# Patient Record
Sex: Female | Born: 1970
Health system: Southern US, Community
[De-identification: ages and names within clinical notes are randomized; demographics above are authoritative.]

## PROBLEM LIST (undated history)

## (undated) DIAGNOSIS — J45909 Unspecified asthma, uncomplicated: Secondary | ICD-10-CM

## (undated) DIAGNOSIS — I219 Acute myocardial infarction, unspecified: Secondary | ICD-10-CM

## (undated) DIAGNOSIS — F319 Bipolar disorder, unspecified: Secondary | ICD-10-CM

## (undated) DIAGNOSIS — K219 Gastro-esophageal reflux disease without esophagitis: Secondary | ICD-10-CM

## (undated) DIAGNOSIS — I10 Essential (primary) hypertension: Secondary | ICD-10-CM

## (undated) DIAGNOSIS — S52023A Displaced fracture of olecranon process without intraarticular extension of unspecified ulna, initial encounter for closed fracture: Secondary | ICD-10-CM

## (undated) HISTORY — PX: ABDOMINAL HYSTERECTOMY: SHX81

## (undated) HISTORY — PX: CHOLECYSTECTOMY: SHX55

---

## 1999-05-04 ENCOUNTER — Inpatient Hospital Stay (HOSPITAL_COMMUNITY): Admission: AD | Admit: 1999-05-04 | Discharge: 1999-05-04 | Payer: Self-pay | Admitting: Obstetrics

## 1999-05-04 ENCOUNTER — Encounter: Payer: Self-pay | Admitting: Obstetrics

## 2000-06-10 ENCOUNTER — Inpatient Hospital Stay (HOSPITAL_COMMUNITY): Admission: AD | Admit: 2000-06-10 | Discharge: 2000-06-10 | Payer: Self-pay | Admitting: *Deleted

## 2001-03-06 ENCOUNTER — Other Ambulatory Visit: Admission: RE | Admit: 2001-03-06 | Discharge: 2001-03-06 | Payer: Self-pay | Admitting: Family Medicine

## 2005-06-12 ENCOUNTER — Emergency Department (HOSPITAL_COMMUNITY): Admission: EM | Admit: 2005-06-12 | Discharge: 2005-06-12 | Payer: Self-pay | Admitting: Emergency Medicine

## 2005-07-30 ENCOUNTER — Emergency Department (HOSPITAL_COMMUNITY): Admission: EM | Admit: 2005-07-30 | Discharge: 2005-07-30 | Payer: Self-pay | Admitting: Emergency Medicine

## 2005-08-13 ENCOUNTER — Emergency Department (HOSPITAL_COMMUNITY): Admission: EM | Admit: 2005-08-13 | Discharge: 2005-08-13 | Payer: Self-pay | Admitting: Emergency Medicine

## 2007-08-20 ENCOUNTER — Inpatient Hospital Stay (HOSPITAL_COMMUNITY): Admission: AD | Admit: 2007-08-20 | Discharge: 2007-08-23 | Payer: Self-pay | Admitting: Psychiatry

## 2007-08-20 ENCOUNTER — Ambulatory Visit: Payer: Self-pay | Admitting: Psychiatry
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
## 2016-01-06 ENCOUNTER — Emergency Department (HOSPITAL_COMMUNITY): Payer: Medicaid Other

## 2016-01-06 ENCOUNTER — Encounter (HOSPITAL_COMMUNITY): Payer: Self-pay | Admitting: *Deleted

## 2016-01-06 ENCOUNTER — Inpatient Hospital Stay (HOSPITAL_COMMUNITY)
Admission: EM | Admit: 2016-01-06 | Discharge: 2016-02-05 | DRG: 004 | Disposition: A | Discharge status: Rehabilitation Facility | Payer: Medicaid Other | Attending: General Surgery | Admitting: General Surgery

## 2016-01-06 ENCOUNTER — Inpatient Hospital Stay (HOSPITAL_COMMUNITY): Payer: Medicaid Other

## 2016-01-06 DIAGNOSIS — J9690 Respiratory failure, unspecified, unspecified whether with hypoxia or hypercapnia: Secondary | ICD-10-CM

## 2016-01-06 DIAGNOSIS — S22019A Unspecified fracture of first thoracic vertebra, initial encounter for closed fracture: Secondary | ICD-10-CM | POA: Diagnosis present

## 2016-01-06 DIAGNOSIS — S062X9A Diffuse traumatic brain injury with loss of consciousness of unspecified duration, initial encounter: Principal | ICD-10-CM | POA: Diagnosis present

## 2016-01-06 DIAGNOSIS — S2242XA Multiple fractures of ribs, left side, initial encounter for closed fracture: Secondary | ICD-10-CM | POA: Diagnosis present

## 2016-01-06 DIAGNOSIS — Z91040 Latex allergy status: Secondary | ICD-10-CM

## 2016-01-06 DIAGNOSIS — I10 Essential (primary) hypertension: Secondary | ICD-10-CM | POA: Diagnosis present

## 2016-01-06 DIAGNOSIS — S065XAA Traumatic subdural hemorrhage with loss of consciousness status unknown, initial encounter: Secondary | ICD-10-CM | POA: Insufficient documentation

## 2016-01-06 DIAGNOSIS — S270XXA Traumatic pneumothorax, initial encounter: Secondary | ICD-10-CM | POA: Diagnosis present

## 2016-01-06 DIAGNOSIS — J9601 Acute respiratory failure with hypoxia: Secondary | ICD-10-CM | POA: Diagnosis present

## 2016-01-06 DIAGNOSIS — G9340 Encephalopathy, unspecified: Secondary | ICD-10-CM | POA: Diagnosis present

## 2016-01-06 DIAGNOSIS — S32401A Unspecified fracture of right acetabulum, initial encounter for closed fracture: Secondary | ICD-10-CM | POA: Diagnosis present

## 2016-01-06 DIAGNOSIS — J939 Pneumothorax, unspecified: Secondary | ICD-10-CM | POA: Insufficient documentation

## 2016-01-06 DIAGNOSIS — D62 Acute posthemorrhagic anemia: Secondary | ICD-10-CM | POA: Diagnosis present

## 2016-01-06 DIAGNOSIS — J80 Acute respiratory distress syndrome: Secondary | ICD-10-CM | POA: Diagnosis not present

## 2016-01-06 DIAGNOSIS — S069XAA Unspecified intracranial injury with loss of consciousness status unknown, initial encounter: Secondary | ICD-10-CM

## 2016-01-06 DIAGNOSIS — R0602 Shortness of breath: Secondary | ICD-10-CM | POA: Insufficient documentation

## 2016-01-06 DIAGNOSIS — J9811 Atelectasis: Secondary | ICD-10-CM | POA: Diagnosis present

## 2016-01-06 DIAGNOSIS — E872 Acidosis: Secondary | ICD-10-CM | POA: Diagnosis present

## 2016-01-06 DIAGNOSIS — S3210XA Unspecified fracture of sacrum, initial encounter for closed fracture: Secondary | ICD-10-CM | POA: Diagnosis present

## 2016-01-06 DIAGNOSIS — S065X9A Traumatic subdural hemorrhage with loss of consciousness of unspecified duration, initial encounter: Secondary | ICD-10-CM | POA: Insufficient documentation

## 2016-01-06 DIAGNOSIS — F10239 Alcohol dependence with withdrawal, unspecified: Secondary | ICD-10-CM | POA: Diagnosis not present

## 2016-01-06 DIAGNOSIS — T48902S Poisoning by unspecified agents primarily acting on the respiratory system, intentional self-harm, sequela: Secondary | ICD-10-CM

## 2016-01-06 DIAGNOSIS — B373 Candidiasis of vulva and vagina: Secondary | ICD-10-CM | POA: Diagnosis not present

## 2016-01-06 DIAGNOSIS — J9600 Acute respiratory failure, unspecified whether with hypoxia or hypercapnia: Secondary | ICD-10-CM | POA: Insufficient documentation

## 2016-01-06 DIAGNOSIS — R739 Hyperglycemia, unspecified: Secondary | ICD-10-CM | POA: Diagnosis not present

## 2016-01-06 DIAGNOSIS — S3219XA Other fracture of sacrum, initial encounter for closed fracture: Secondary | ICD-10-CM | POA: Diagnosis present

## 2016-01-06 DIAGNOSIS — S299XXA Unspecified injury of thorax, initial encounter: Secondary | ICD-10-CM

## 2016-01-06 DIAGNOSIS — S22009A Unspecified fracture of unspecified thoracic vertebra, initial encounter for closed fracture: Secondary | ICD-10-CM | POA: Diagnosis present

## 2016-01-06 DIAGNOSIS — A0472 Enterocolitis due to Clostridium difficile, not specified as recurrent: Secondary | ICD-10-CM | POA: Diagnosis not present

## 2016-01-06 DIAGNOSIS — Z9689 Presence of other specified functional implants: Secondary | ICD-10-CM

## 2016-01-06 DIAGNOSIS — Y906 Blood alcohol level of 120-199 mg/100 ml: Secondary | ICD-10-CM | POA: Diagnosis present

## 2016-01-06 DIAGNOSIS — S0101XA Laceration without foreign body of scalp, initial encounter: Secondary | ICD-10-CM | POA: Diagnosis present

## 2016-01-06 DIAGNOSIS — Z978 Presence of other specified devices: Secondary | ICD-10-CM

## 2016-01-06 DIAGNOSIS — J156 Pneumonia due to other aerobic Gram-negative bacteria: Secondary | ICD-10-CM | POA: Diagnosis not present

## 2016-01-06 DIAGNOSIS — S32599A Other specified fracture of unspecified pubis, initial encounter for closed fracture: Secondary | ICD-10-CM | POA: Diagnosis present

## 2016-01-06 DIAGNOSIS — Z4659 Encounter for fitting and adjustment of other gastrointestinal appliance and device: Secondary | ICD-10-CM

## 2016-01-06 DIAGNOSIS — S06369A Traumatic hemorrhage of cerebrum, unspecified, with loss of consciousness of unspecified duration, initial encounter: Secondary | ICD-10-CM | POA: Diagnosis present

## 2016-01-06 DIAGNOSIS — S069X9A Unspecified intracranial injury with loss of consciousness of unspecified duration, initial encounter: Secondary | ICD-10-CM

## 2016-01-06 DIAGNOSIS — S2249XA Multiple fractures of ribs, unspecified side, initial encounter for closed fracture: Secondary | ICD-10-CM

## 2016-01-06 DIAGNOSIS — T07XXXA Unspecified multiple injuries, initial encounter: Secondary | ICD-10-CM

## 2016-01-06 DIAGNOSIS — R000 Tachycardia, unspecified: Secondary | ICD-10-CM | POA: Insufficient documentation

## 2016-01-06 DIAGNOSIS — Z781 Physical restraint status: Secondary | ICD-10-CM

## 2016-01-06 DIAGNOSIS — A047 Enterocolitis due to Clostridium difficile: Secondary | ICD-10-CM | POA: Diagnosis not present

## 2016-01-06 DIAGNOSIS — S12600A Unspecified displaced fracture of seventh cervical vertebra, initial encounter for closed fracture: Secondary | ICD-10-CM | POA: Diagnosis present

## 2016-01-06 DIAGNOSIS — G2581 Restless legs syndrome: Secondary | ICD-10-CM | POA: Diagnosis not present

## 2016-01-06 DIAGNOSIS — S061X3A Traumatic cerebral edema with loss of consciousness of 1 hour to 5 hours 59 minutes, initial encounter: Secondary | ICD-10-CM | POA: Diagnosis present

## 2016-01-06 DIAGNOSIS — D6489 Other specified anemias: Secondary | ICD-10-CM | POA: Diagnosis present

## 2016-01-06 DIAGNOSIS — S32059A Unspecified fracture of fifth lumbar vertebra, initial encounter for closed fracture: Secondary | ICD-10-CM | POA: Diagnosis present

## 2016-01-06 DIAGNOSIS — S32591A Other specified fracture of right pubis, initial encounter for closed fracture: Secondary | ICD-10-CM | POA: Diagnosis present

## 2016-01-06 DIAGNOSIS — F4323 Adjustment disorder with mixed anxiety and depressed mood: Secondary | ICD-10-CM | POA: Insufficient documentation

## 2016-01-06 DIAGNOSIS — R1310 Dysphagia, unspecified: Secondary | ICD-10-CM | POA: Insufficient documentation

## 2016-01-06 DIAGNOSIS — Z4682 Encounter for fitting and adjustment of non-vascular catheter: Secondary | ICD-10-CM

## 2016-01-06 DIAGNOSIS — Z880 Allergy status to penicillin: Secondary | ICD-10-CM

## 2016-01-06 DIAGNOSIS — Y95 Nosocomial condition: Secondary | ICD-10-CM | POA: Diagnosis not present

## 2016-01-06 DIAGNOSIS — S27329A Contusion of lung, unspecified, initial encounter: Secondary | ICD-10-CM | POA: Diagnosis present

## 2016-01-06 DIAGNOSIS — R4182 Altered mental status, unspecified: Secondary | ICD-10-CM | POA: Diagnosis present

## 2016-01-06 DIAGNOSIS — S32409A Unspecified fracture of unspecified acetabulum, initial encounter for closed fracture: Secondary | ICD-10-CM | POA: Diagnosis present

## 2016-01-06 DIAGNOSIS — R40242 Glasgow coma scale score 9-12, unspecified time: Secondary | ICD-10-CM | POA: Diagnosis present

## 2016-01-06 DIAGNOSIS — R451 Restlessness and agitation: Secondary | ICD-10-CM | POA: Diagnosis present

## 2016-01-06 DIAGNOSIS — Z79899 Other long term (current) drug therapy: Secondary | ICD-10-CM

## 2016-01-06 DIAGNOSIS — H109 Unspecified conjunctivitis: Secondary | ICD-10-CM | POA: Diagnosis not present

## 2016-01-06 DIAGNOSIS — B9562 Methicillin resistant Staphylococcus aureus infection as the cause of diseases classified elsewhere: Secondary | ICD-10-CM | POA: Diagnosis not present

## 2016-01-06 DIAGNOSIS — F1410 Cocaine abuse, uncomplicated: Secondary | ICD-10-CM | POA: Insufficient documentation

## 2016-01-06 DIAGNOSIS — F1010 Alcohol abuse, uncomplicated: Secondary | ICD-10-CM | POA: Insufficient documentation

## 2016-01-06 DIAGNOSIS — J189 Pneumonia, unspecified organism: Secondary | ICD-10-CM | POA: Diagnosis not present

## 2016-01-06 DIAGNOSIS — S134XXA Sprain of ligaments of cervical spine, initial encounter: Secondary | ICD-10-CM | POA: Diagnosis present

## 2016-01-06 DIAGNOSIS — Y92488 Other paved roadways as the place of occurrence of the external cause: Secondary | ICD-10-CM | POA: Diagnosis not present

## 2016-01-06 DIAGNOSIS — E8770 Fluid overload, unspecified: Secondary | ICD-10-CM | POA: Diagnosis present

## 2016-01-06 DIAGNOSIS — S129XXA Fracture of neck, unspecified, initial encounter: Secondary | ICD-10-CM | POA: Diagnosis present

## 2016-01-06 DIAGNOSIS — Z930 Tracheostomy status: Secondary | ICD-10-CM

## 2016-01-06 DIAGNOSIS — T148XXA Other injury of unspecified body region, initial encounter: Secondary | ICD-10-CM

## 2016-01-06 LAB — BLOOD PRODUCT ORDER (VERBAL) VERIFICATION

## 2016-01-06 LAB — COMPREHENSIVE METABOLIC PANEL
ALT: 93 U/L — ABNORMAL HIGH (ref 14–54)
ANION GAP: 16 — AB (ref 5–15)
AST: 180 U/L — ABNORMAL HIGH (ref 15–41)
Albumin: 3.9 g/dL (ref 3.5–5.0)
Alkaline Phosphatase: 67 U/L (ref 38–126)
BILIRUBIN TOTAL: 0.3 mg/dL (ref 0.3–1.2)
BUN: <5 mg/dL — ABNORMAL LOW (ref 6–20)
CALCIUM: 8.8 mg/dL — AB (ref 8.9–10.3)
CO2: 23 mmol/L (ref 22–32)
Chloride: 101 mmol/L (ref 101–111)
Creatinine, Ser: 0.67 mg/dL (ref 0.44–1.00)
Glucose, Bld: 136 mg/dL — ABNORMAL HIGH (ref 65–99)
POTASSIUM: 3.7 mmol/L (ref 3.5–5.1)
Sodium: 140 mmol/L (ref 135–145)
TOTAL PROTEIN: 6.6 g/dL (ref 6.5–8.1)

## 2016-01-06 LAB — URINALYSIS, ROUTINE W REFLEX MICROSCOPIC
BILIRUBIN URINE: NEGATIVE
Glucose, UA: NEGATIVE mg/dL
Ketones, ur: NEGATIVE mg/dL
Leukocytes, UA: NEGATIVE
NITRITE: NEGATIVE
PROTEIN: 30 mg/dL — AB
SPECIFIC GRAVITY, URINE: 1.026 (ref 1.005–1.030)
pH: 5.5 (ref 5.0–8.0)

## 2016-01-06 LAB — URINE MICROSCOPIC-ADD ON
Bacteria, UA: NONE SEEN
WBC, UA: NONE SEEN WBC/hpf (ref 0–5)

## 2016-01-06 LAB — CBG MONITORING, ED: GLUCOSE-CAPILLARY: 146 mg/dL — AB (ref 65–99)

## 2016-01-06 LAB — BLOOD GAS, ARTERIAL
Acid-base deficit: 0.9 mmol/L (ref 0.0–2.0)
Bicarbonate: 24.1 meq/L — ABNORMAL HIGH (ref 20.0–24.0)
Drawn by: 441371
FIO2: 0.5
MECHVT: 480 mL
O2 Saturation: 97.5 %
PEEP: 5 cmH2O
Patient temperature: 98.6
RATE: 20 {breaths}/min
TCO2: 25.5 mmol/L (ref 0–100)
pCO2 arterial: 45.7 mmHg — ABNORMAL HIGH (ref 35.0–45.0)
pH, Arterial: 7.342 — ABNORMAL LOW (ref 7.350–7.450)
pO2, Arterial: 106 mmHg — ABNORMAL HIGH (ref 80.0–100.0)

## 2016-01-06 LAB — I-STAT CHEM 8, ED
BUN: 5 mg/dL — AB (ref 6–20)
CALCIUM ION: 1.05 mmol/L — AB (ref 1.12–1.23)
CREATININE: 0.9 mg/dL (ref 0.44–1.00)
Chloride: 103 mmol/L (ref 101–111)
GLUCOSE: 134 mg/dL — AB (ref 65–99)
HCT: 43 % (ref 36.0–46.0)
Hemoglobin: 14.6 g/dL (ref 12.0–15.0)
Potassium: 3.9 mmol/L (ref 3.5–5.1)
SODIUM: 141 mmol/L (ref 135–145)
TCO2: 24 mmol/L (ref 0–100)

## 2016-01-06 LAB — I-STAT CG4 LACTIC ACID, ED: LACTIC ACID, VENOUS: 2.53 mmol/L — AB (ref 0.5–2.0)

## 2016-01-06 LAB — TRIGLYCERIDES: TRIGLYCERIDES: 304 mg/dL — AB (ref <150)

## 2016-01-06 LAB — CBC
HEMATOCRIT: 39.7 % (ref 36.0–46.0)
HEMOGLOBIN: 12.9 g/dL (ref 12.0–15.0)
MCH: 31.8 pg (ref 26.0–34.0)
MCHC: 32.5 g/dL (ref 30.0–36.0)
MCV: 97.8 fL (ref 78.0–100.0)
Platelets: 301 10*3/uL (ref 150–400)
RBC: 4.06 MIL/uL (ref 3.87–5.11)
RDW: 13.2 % (ref 11.5–15.5)
WBC: 16.3 10*3/uL — AB (ref 4.0–10.5)

## 2016-01-06 LAB — ETHANOL: ALCOHOL ETHYL (B): 171 mg/dL — AB (ref <5)

## 2016-01-06 LAB — I-STAT BETA HCG BLOOD, ED (MC, WL, AP ONLY)

## 2016-01-06 LAB — MRSA PCR SCREENING: MRSA by PCR: NEGATIVE

## 2016-01-06 LAB — PROTIME-INR
INR: 1.11 (ref 0.00–1.49)
Prothrombin Time: 14.5 seconds (ref 11.6–15.2)

## 2016-01-06 LAB — CDS SEROLOGY

## 2016-01-06 MED ORDER — PROPOFOL 1000 MG/100ML IV EMUL
0.0000 ug/kg/min | INTRAVENOUS | Status: DC
  Administered 2016-01-06 (×2): 50 ug/kg/min via INTRAVENOUS
  Administered 2016-01-06: 40 ug/kg/min via INTRAVENOUS
  Administered 2016-01-06 – 2016-01-07 (×3): 50 ug/kg/min via INTRAVENOUS
  Administered 2016-01-07: 30 ug/kg/min via INTRAVENOUS
  Administered 2016-01-07: 50 ug/kg/min via INTRAVENOUS
  Administered 2016-01-07: 30 ug/kg/min via INTRAVENOUS
  Administered 2016-01-08: 15 ug/kg/min via INTRAVENOUS
  Administered 2016-01-08 – 2016-01-09 (×6): 30 ug/kg/min via INTRAVENOUS
  Administered 2016-01-10 (×2): 50 ug/kg/min via INTRAVENOUS
  Filled 2016-01-06 (×19): qty 100

## 2016-01-06 MED ORDER — ACETAMINOPHEN 325 MG PO TABS
650.0000 mg | ORAL_TABLET | ORAL | Status: DC | PRN
  Administered 2016-01-07: 650 mg via ORAL
  Filled 2016-01-06 (×2): qty 2

## 2016-01-06 MED ORDER — SODIUM CHLORIDE 0.9 % IV SOLN
1.0000 ug/h | Freq: Once | INTRAVENOUS | Status: AC
  Administered 2016-01-06: 50 ug/h via INTRAVENOUS
  Filled 2016-01-06: qty 50

## 2016-01-06 MED ORDER — FENTANYL CITRATE (PF) 100 MCG/2ML IJ SOLN: 100.0000 ug | INTRAMUSCULAR | Status: DC | PRN

## 2016-01-06 MED ORDER — FENTANYL CITRATE (PF) 100 MCG/2ML IJ SOLN
100.0000 ug | Freq: Once | INTRAMUSCULAR | Status: DC
Start: 2016-01-06 — End: 2016-01-06
  Filled 2016-01-06: qty 2

## 2016-01-06 MED ORDER — ONDANSETRON HCL 4 MG/2ML IJ SOLN
4.0000 mg | Freq: Four times a day (QID) | INTRAMUSCULAR | Status: DC | PRN
  Administered 2016-01-31 – 2016-02-03 (×3): 4 mg via INTRAVENOUS
  Filled 2016-01-06 (×3): qty 2

## 2016-01-06 MED ORDER — FENTANYL CITRATE (PF) 2500 MCG/50ML IJ SOLN
25.0000 ug/h | INTRAMUSCULAR | Status: DC
  Administered 2016-01-06: 200 ug/h via INTRAVENOUS
  Administered 2016-01-06 – 2016-01-07 (×2): 100 ug/h via INTRAVENOUS
  Administered 2016-01-08: 75 ug/h via INTRAVENOUS
  Administered 2016-01-09: 100 ug/h via INTRAVENOUS
  Administered 2016-01-10 – 2016-01-11 (×2): 400 ug/h via INTRAVENOUS
  Filled 2016-01-06 (×8): qty 50

## 2016-01-06 MED ORDER — IOPAMIDOL (ISOVUE-300) INJECTION 61%
INTRAVENOUS | Status: AC
  Filled 2016-01-06: qty 100

## 2016-01-06 MED ORDER — SODIUM CHLORIDE 0.9 % IV SOLN
INTRAVENOUS | Status: DC
  Administered 2016-01-06: 100 mL/h via INTRAVENOUS

## 2016-01-06 MED ORDER — SODIUM CHLORIDE 0.9 % IV SOLN
INTRAVENOUS | Status: DC
  Administered 2016-01-08 – 2016-01-09 (×2): via INTRAVENOUS
  Administered 2016-01-10: 125 mL/h via INTRAVENOUS
  Administered 2016-01-11 (×2): 125 mL via INTRAVENOUS
  Administered 2016-01-16 – 2016-01-20 (×2): via INTRAVENOUS
  Administered 2016-01-22: 250 mL via INTRAVENOUS

## 2016-01-06 MED ORDER — PROPOFOL 1000 MG/100ML IV EMUL
INTRAVENOUS | Status: AC
  Filled 2016-01-06: qty 100

## 2016-01-06 MED ORDER — TETANUS-DIPHTH-ACELL PERTUSSIS 5-2.5-18.5 LF-MCG/0.5 IM SUSP
0.5000 mL | Freq: Once | INTRAMUSCULAR | Status: AC
  Administered 2016-01-06: 0.5 mL via INTRAMUSCULAR

## 2016-01-06 MED ORDER — FENTANYL CITRATE (PF) 100 MCG/2ML IJ SOLN
INTRAMUSCULAR | Status: AC | PRN
  Administered 2016-01-06: 100 ug via INTRAVENOUS

## 2016-01-06 MED ORDER — ANTISEPTIC ORAL RINSE SOLUTION (CORINZ)
7.0000 mL | Freq: Four times a day (QID) | OROMUCOSAL | Status: DC
  Administered 2016-01-06 – 2016-01-08 (×9): 7 mL via OROMUCOSAL

## 2016-01-06 MED ORDER — CETYLPYRIDINIUM CHLORIDE 0.05 % MT LIQD: 7.0000 mL | Freq: Two times a day (BID) | OROMUCOSAL | Status: DC

## 2016-01-06 MED ORDER — PROPOFOL 1000 MG/100ML IV EMUL
5.0000 ug/kg/min | Freq: Once | INTRAVENOUS | Status: AC
  Administered 2016-01-06: 10 ug/kg/min via INTRAVENOUS

## 2016-01-06 MED ORDER — CHLORHEXIDINE GLUCONATE 0.12% ORAL RINSE (MEDLINE KIT)
15.0000 mL | Freq: Two times a day (BID) | OROMUCOSAL | Status: DC
  Administered 2016-01-06 – 2016-01-08 (×4): 15 mL via OROMUCOSAL

## 2016-01-06 MED ORDER — MORPHINE SULFATE (PF) 2 MG/ML IV SOLN: 2.0000 mg | INTRAVENOUS | Status: DC | PRN

## 2016-01-06 MED ORDER — SODIUM CHLORIDE 0.9 % IV BOLUS (SEPSIS)
1000.0000 mL | Freq: Once | INTRAVENOUS | Status: AC
  Administered 2016-01-06: 1000 mL via INTRAVENOUS

## 2016-01-06 MED ORDER — LORAZEPAM 2 MG/ML IJ SOLN
INTRAMUSCULAR | Status: AC | PRN
  Administered 2016-01-06: 2 mg via INTRAVENOUS

## 2016-01-06 MED ORDER — TETANUS-DIPHTH-ACELL PERTUSSIS 5-2.5-18.5 LF-MCG/0.5 IM SUSP
INTRAMUSCULAR | Status: AC
  Filled 2016-01-06: qty 0.5

## 2016-01-06 MED ORDER — ONDANSETRON HCL 4 MG PO TABS: 4.0000 mg | ORAL_TABLET | Freq: Four times a day (QID) | ORAL | Status: DC | PRN

## 2016-01-06 MED ORDER — CHLORHEXIDINE GLUCONATE 0.12 % MT SOLN
15.0000 mL | Freq: Two times a day (BID) | OROMUCOSAL | Status: DC
  Administered 2016-01-07: 15 mL via OROMUCOSAL

## 2016-01-06 MED ORDER — DOCUSATE SODIUM 100 MG PO CAPS
100.0000 mg | ORAL_CAPSULE | Freq: Two times a day (BID) | ORAL | Status: DC
  Administered 2016-01-10 – 2016-01-11 (×2): 100 mg via ORAL
  Filled 2016-01-06 (×2): qty 1

## 2016-01-06 MED ORDER — HALOPERIDOL LACTATE 5 MG/ML IJ SOLN
INTRAMUSCULAR | Status: AC
  Filled 2016-01-06: qty 1

## 2016-01-06 MED ORDER — FENTANYL BOLUS VIA INFUSION
50.0000 ug | INTRAVENOUS | Status: DC | PRN
  Filled 2016-01-06: qty 100

## 2016-01-06 MED ORDER — ETOMIDATE 2 MG/ML IV SOLN
INTRAVENOUS | Status: AC | PRN
  Administered 2016-01-06: 30 mg via INTRAVENOUS

## 2016-01-06 MED ORDER — SUCCINYLCHOLINE CHLORIDE 20 MG/ML IJ SOLN
INTRAMUSCULAR | Status: AC | PRN
  Administered 2016-01-06: 120 mg via INTRAVENOUS
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
## 2016-01-06 NOTE — ED Notes (Signed)
Trauma surgeon Dr Donnetta HailKinsenger at bedside.
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
## 2016-01-06 NOTE — ED Notes (Signed)
Neuro surgery at bedside.
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
## 2016-01-06 NOTE — Progress Notes (Signed)
[facility] by [facility] physician with a 7.5 ETT taped at 22 cm at lip, good color change on ETCO2 detector, good BBS placed on above vent settings MD aware.

## 2016-01-06 NOTE — ED Notes (Signed)
Attempted report 
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
## 2016-01-06 NOTE — ED Notes (Signed)
Blood bank called and stated blood type does not match what was previously in pt chart.  RE-ordered.  Spoke with registration and photo of pt does not match pt.  [employer] just arrived and stated pt we have been charting on is not pt in the stretcher.
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
## 2016-01-06 NOTE — ED Notes (Signed)
Spoke with Dr Alvan DameKensinger staff who stated to start fentanyl drip.
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
## 2016-01-07 ENCOUNTER — Inpatient Hospital Stay (HOSPITAL_COMMUNITY): Payer: Medicaid Other

## 2016-01-07 LAB — TYPE AND SCREEN
ABO/RH(D): A POS
ABO/RH(D): A POS
Antibody Screen: NEGATIVE
Antibody Screen: NEGATIVE
UNIT DIVISION: 0
Unit division: 0

## 2016-01-07 LAB — BASIC METABOLIC PANEL
Anion gap: 9 (ref 5–15)
Anion gap: 6 (ref 5–15)
BUN: 9 mg/dL (ref 6–20)
BUN: 11 mg/dL (ref 6–20)
CHLORIDE: 107 mmol/L (ref 101–111)
CHLORIDE: 108 mmol/L (ref 101–111)
CO2: 24 mmol/L (ref 22–32)
CO2: 25 mmol/L (ref 22–32)
CREATININE: 0.77 mg/dL (ref 0.44–1.00)
Calcium: 7.8 mg/dL — ABNORMAL LOW (ref 8.9–10.3)
Calcium: 7.2 mg/dL — ABNORMAL LOW (ref 8.9–10.3)
Creatinine, Ser: 0.76 mg/dL (ref 0.44–1.00)
GFR calc Af Amer: >60 mL/min (ref >60)
GFR calc Af Amer: >60 mL/min (ref >60)
GFR calc non Af Amer: >60 mL/min (ref >60)
GFR calc non Af Amer: >60 mL/min (ref >60)
GLUCOSE: 74 mg/dL (ref 65–99)
GLUCOSE: 85 mg/dL (ref 65–99)
POTASSIUM: 3.6 mmol/L (ref 3.5–5.1)
POTASSIUM: 3.2 mmol/L — AB (ref 3.5–5.1)
Sodium: 140 mmol/L (ref 135–145)
Sodium: 139 mmol/L (ref 135–145)

## 2016-01-07 LAB — CBC
HCT: 38.6 % (ref 36.0–46.0)
HEMOGLOBIN: 12.2 g/dL (ref 12.0–15.0)
MCH: 31.5 pg (ref 26.0–34.0)
MCHC: 31.6 g/dL (ref 30.0–36.0)
MCV: 99.7 fL (ref 78.0–100.0)
PLATELETS: 177 10*3/uL (ref 150–400)
RBC: 3.87 MIL/uL (ref 3.87–5.11)
RDW: 13.8 % (ref 11.5–15.5)
WBC: 10.3 10*3/uL (ref 4.0–10.5)

## 2016-01-07 LAB — GLUCOSE, CAPILLARY
GLUCOSE-CAPILLARY: 77 mg/dL (ref 65–99)
GLUCOSE-CAPILLARY: 95 mg/dL (ref 65–99)
GLUCOSE-CAPILLARY: 77 mg/dL (ref 65–99)
Glucose-Capillary: 76 mg/dL (ref 65–99)
Glucose-Capillary: 107 mg/dL — ABNORMAL HIGH (ref 65–99)
Glucose-Capillary: 82 mg/dL (ref 65–99)

## 2016-01-07 LAB — SODIUM, URINE, RANDOM: SODIUM UR: 36 mmol/L

## 2016-01-07 LAB — CK TOTAL AND CKMB (NOT AT ARMC)
CK TOTAL: 770 U/L — AB (ref 38–234)
CK, MB: 8.1 ng/mL — ABNORMAL HIGH (ref 0.5–5.0)
Relative Index: 1.1 (ref 0.0–2.5)

## 2016-01-07 LAB — CREATININE, URINE, RANDOM: CREATININE, URINE: 268.47 mg/dL

## 2016-01-07 MED ORDER — LACTATED RINGERS IV BOLUS (SEPSIS)
1000.0000 mL | Freq: Once | INTRAVENOUS | Status: AC
  Administered 2016-01-07: 1000 mL via INTRAVENOUS

## 2016-01-07 MED ORDER — SODIUM CHLORIDE 0.9 % IV BOLUS (SEPSIS)
500.0000 mL | Freq: Once | INTRAVENOUS | Status: AC
  Administered 2016-01-07: 500 mL via INTRAVENOUS

## 2016-01-07 MED ORDER — ALBUMIN HUMAN 5 % IV SOLN
25.0000 g | Freq: Once | INTRAVENOUS | Status: AC
  Administered 2016-01-07: 25 g via INTRAVENOUS
  Filled 2016-01-07: qty 500

## 2016-01-07 MED ORDER — PANTOPRAZOLE SODIUM 40 MG IV SOLR
40.0000 mg | Freq: Every day | INTRAVENOUS | Status: DC
  Administered 2016-01-07 – 2016-01-16 (×10): 40 mg via INTRAVENOUS
  Filled 2016-01-07 (×10): qty 40

## 2016-01-07 NOTE — Progress Notes (Signed)
[facility] results: CK total-770, CK,MB-8.1, K-3.2, urine sent, receiving albumin now, urine approx 20/hr. Order for 1L LR bolus, renewed restraints. VS stable. Will cont to monitor and update as needed. Tericka Devincenzi L

## 2016-01-07 NOTE — Progress Notes (Signed)
Paged Dr. Donell BeersByerly about decreasing urine output.  Scant urine over the past two hours.  Will continue to assess and try to contact the physician.  Tommi EmeryHINTZ, Kodah Maret M
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
## 2016-01-07 NOTE — Progress Notes (Signed)
Patient ID: [name], female   DOB: 09/26/1970, 45 y.o.   MRN: [id number] Stable, f/c. Intubated. Around with family.
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
## 2016-01-08 ENCOUNTER — Inpatient Hospital Stay (HOSPITAL_COMMUNITY): Payer: Medicaid Other

## 2016-01-08 LAB — RAPID URINE DRUG SCREEN, HOSP PERFORMED
Amphetamines: NOT DETECTED
Barbiturates: NOT DETECTED
Benzodiazepines: POSITIVE — AB
Cocaine: POSITIVE — AB
OPIATES: NOT DETECTED
Tetrahydrocannabinol: POSITIVE — AB

## 2016-01-08 LAB — GLUCOSE, CAPILLARY
GLUCOSE-CAPILLARY: 75 mg/dL (ref 65–99)
GLUCOSE-CAPILLARY: 81 mg/dL (ref 65–99)
Glucose-Capillary: 73 mg/dL (ref 65–99)
Glucose-Capillary: 79 mg/dL (ref 65–99)
Glucose-Capillary: 84 mg/dL (ref 65–99)
Glucose-Capillary: 88 mg/dL (ref 65–99)
Glucose-Capillary: 91 mg/dL (ref 65–99)

## 2016-01-08 LAB — PREPARE FRESH FROZEN PLASMA
Unit division: 0
Unit division: 0

## 2016-01-08 LAB — CBC WITH DIFFERENTIAL/PLATELET
Basophils Absolute: 0 10*3/uL (ref 0.0–0.1)
Basophils Relative: 0 %
EOS ABS: 0.2 10*3/uL (ref 0.0–0.7)
EOS PCT: 2 %
HCT: 29.6 % — ABNORMAL LOW (ref 36.0–46.0)
Hemoglobin: 9.3 g/dL — ABNORMAL LOW (ref 12.0–15.0)
LYMPHS ABS: 1.9 10*3/uL (ref 0.7–4.0)
LYMPHS PCT: 19 %
MCH: 31.4 pg (ref 26.0–34.0)
MCHC: 31.4 g/dL (ref 30.0–36.0)
MCV: 100 fL (ref 78.0–100.0)
MONO ABS: 0.7 10*3/uL (ref 0.1–1.0)
Monocytes Relative: 7 %
Neutro Abs: 7.1 10*3/uL (ref 1.7–7.7)
Neutrophils Relative %: 72 %
PLATELETS: 133 10*3/uL — AB (ref 150–400)
RBC: 2.96 MIL/uL — ABNORMAL LOW (ref 3.87–5.11)
RDW: 13.5 % (ref 11.5–15.5)
WBC: 10 10*3/uL (ref 4.0–10.5)

## 2016-01-08 LAB — BASIC METABOLIC PANEL
Anion gap: 8 (ref 5–15)
BUN: 9 mg/dL (ref 6–20)
CALCIUM: 7.3 mg/dL — AB (ref 8.9–10.3)
CO2: 22 mmol/L (ref 22–32)
CREATININE: 0.67 mg/dL (ref 0.44–1.00)
Chloride: 109 mmol/L (ref 101–111)
GFR calc Af Amer: >60 mL/min (ref >60)
GLUCOSE: 77 mg/dL (ref 65–99)
Potassium: 3.6 mmol/L (ref 3.5–5.1)
SODIUM: 139 mmol/L (ref 135–145)

## 2016-01-08 LAB — POCT I-STAT 3, ART BLOOD GAS (G3+)
ACID-BASE DEFICIT: 3 mmol/L — AB (ref 0.0–2.0)
BICARBONATE: 22.4 meq/L (ref 20.0–24.0)
O2 SAT: 96 %
PO2 ART: 75 mmHg — AB (ref 80.0–100.0)
Patient temperature: 35.4
TCO2: 24 mmol/L (ref 0–100)
pCO2 arterial: 36 mmHg (ref 35.0–45.0)
pH, Arterial: 7.395 (ref 7.350–7.450)

## 2016-01-08 MED ORDER — ANTISEPTIC ORAL RINSE SOLUTION (CORINZ)
7.0000 mL | OROMUCOSAL | Status: DC
  Administered 2016-01-08 – 2016-01-29 (×207): 7 mL via OROMUCOSAL

## 2016-01-08 MED ORDER — IPRATROPIUM-ALBUTEROL 0.5-2.5 (3) MG/3ML IN SOLN
3.0000 mL | Freq: Four times a day (QID) | RESPIRATORY_TRACT | Status: DC
  Administered 2016-01-08 – 2016-01-09 (×4): 3 mL via RESPIRATORY_TRACT
  Filled 2016-01-08 (×4): qty 3

## 2016-01-08 MED ORDER — ANTISEPTIC ORAL RINSE SOLUTION (CORINZ): 7.0000 mL | Freq: Four times a day (QID) | OROMUCOSAL | Status: DC

## 2016-01-08 MED ORDER — CHLORHEXIDINE GLUCONATE 0.12% ORAL RINSE (MEDLINE KIT): 15.0000 mL | Freq: Two times a day (BID) | OROMUCOSAL | Status: DC

## 2016-01-08 MED ORDER — CHLORHEXIDINE GLUCONATE 0.12% ORAL RINSE (MEDLINE KIT)
15.0000 mL | Freq: Two times a day (BID) | OROMUCOSAL | Status: DC
  Administered 2016-01-08 – 2016-01-29 (×43): 15 mL via OROMUCOSAL

## 2016-01-08 MED ORDER — IOPAMIDOL (ISOVUE-300) INJECTION 61%
100.0000 mL | Freq: Once | INTRAVENOUS | Status: AC | PRN
  Administered 2016-01-06: 100 mL via INTRAVENOUS

## 2016-01-08 MED ORDER — ACETAMINOPHEN 160 MG/5ML PO SOLN
650.0000 mg | ORAL | Status: DC | PRN
  Administered 2016-01-08 – 2016-01-28 (×29): 650 mg
  Filled 2016-01-08 (×28): qty 20.3
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
## 2016-01-08 NOTE — Progress Notes (Signed)
Patient ID: [name], female   DOB: 08/11/1971, 45 y.o.   MRN: [id number] Intubated, moves all 4 extremities for ct head this am

## 2016-01-08 NOTE — Progress Notes (Signed)
Transported pt to and from 2S15 to CT 3 on ventilator. Pt stable throughout with no complications. RT will continue to monitor
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
## 2016-01-09 LAB — BASIC METABOLIC PANEL
ANION GAP: 9 (ref 5–15)
CHLORIDE: 109 mmol/L (ref 101–111)
CO2: 20 mmol/L — ABNORMAL LOW (ref 22–32)
Calcium: 7.3 mg/dL — ABNORMAL LOW (ref 8.9–10.3)
Creatinine, Ser: 0.62 mg/dL (ref 0.44–1.00)
GFR calc Af Amer: >60 mL/min (ref >60)
GFR calc non Af Amer: >60 mL/min (ref >60)
Glucose, Bld: 81 mg/dL (ref 65–99)
POTASSIUM: 3 mmol/L — AB (ref 3.5–5.1)
SODIUM: 138 mmol/L (ref 135–145)

## 2016-01-09 LAB — CBC WITH DIFFERENTIAL/PLATELET
Basophils Absolute: 0 10*3/uL (ref 0.0–0.1)
Basophils Relative: 0 %
EOS ABS: 0.2 10*3/uL (ref 0.0–0.7)
Eosinophils Relative: 3 %
HCT: 25.6 % — ABNORMAL LOW (ref 36.0–46.0)
Hemoglobin: 8.2 g/dL — ABNORMAL LOW (ref 12.0–15.0)
Lymphocytes Relative: 15 %
Lymphs Abs: 1.1 10*3/uL (ref 0.7–4.0)
MCH: 30.8 pg (ref 26.0–34.0)
MCHC: 32 g/dL (ref 30.0–36.0)
MCV: 96.2 fL (ref 78.0–100.0)
MONO ABS: 0.5 10*3/uL (ref 0.1–1.0)
Monocytes Relative: 7 %
NEUTROS PCT: 75 %
Neutro Abs: 5.7 10*3/uL (ref 1.7–7.7)
PLATELETS: 166 10*3/uL (ref 150–400)
RBC: 2.66 MIL/uL — AB (ref 3.87–5.11)
RDW: 13.3 % (ref 11.5–15.5)
SMEAR REVIEW: ADEQUATE
WBC: 7.5 10*3/uL (ref 4.0–10.5)

## 2016-01-09 LAB — GLUCOSE, CAPILLARY
GLUCOSE-CAPILLARY: 73 mg/dL (ref 65–99)
Glucose-Capillary: 76 mg/dL (ref 65–99)
Glucose-Capillary: 87 mg/dL (ref 65–99)
Glucose-Capillary: 98 mg/dL (ref 65–99)
Glucose-Capillary: 123 mg/dL — ABNORMAL HIGH (ref 65–99)
Glucose-Capillary: 145 mg/dL — ABNORMAL HIGH (ref 65–99)

## 2016-01-09 LAB — TRIGLYCERIDES
TRIGLYCERIDES: 257 mg/dL — AB (ref <150)
TRIGLYCERIDES: 264 mg/dL — AB (ref <150)

## 2016-01-09 MED ORDER — IPRATROPIUM-ALBUTEROL 0.5-2.5 (3) MG/3ML IN SOLN
3.0000 mL | Freq: Four times a day (QID) | RESPIRATORY_TRACT | Status: DC
  Administered 2016-01-09 – 2016-01-12 (×12): 3 mL via RESPIRATORY_TRACT
  Filled 2016-01-09: qty 3
  Filled 2016-01-09: qty 39
  Filled 2016-01-09 (×10): qty 3

## 2016-01-09 MED ORDER — VITAMIN C 500 MG PO TABS
1000.0000 mg | ORAL_TABLET | Freq: Three times a day (TID) | ORAL | Status: AC
  Administered 2016-01-09 – 2016-01-16 (×21): 1000 mg
  Filled 2016-01-09 (×21): qty 2

## 2016-01-09 MED ORDER — POTASSIUM CHLORIDE 10 MEQ/100ML IV SOLN
10.0000 meq | INTRAVENOUS | Status: AC
  Administered 2016-01-09 (×2): 10 meq via INTRAVENOUS
  Filled 2016-01-09 (×2): qty 100

## 2016-01-09 MED ORDER — POTASSIUM CHLORIDE 10 MEQ/50ML IV SOLN: 10.0000 meq | INTRAVENOUS | Status: DC

## 2016-01-09 MED ORDER — SELENIUM 50 MCG PO TABS
200.0000 ug | ORAL_TABLET | Freq: Every day | ORAL | Status: AC
Start: 2016-01-09 — End: 2016-01-15
  Administered 2016-01-09 – 2016-01-15 (×7): 200 ug
  Filled 2016-01-09 (×7): qty 4

## 2016-01-09 MED ORDER — PIVOT 1.5 CAL PO LIQD
1000.0000 mL | ORAL | Status: DC
  Administered 2016-01-10 – 2016-01-11 (×2): 1000 mL

## 2016-01-09 MED ORDER — POTASSIUM CHLORIDE 20 MEQ/15ML (10%) PO SOLN
40.0000 meq | Freq: Two times a day (BID) | ORAL | Status: AC
  Administered 2016-01-09 (×2): 40 meq via ORAL
  Filled 2016-01-09 (×2): qty 30

## 2016-01-09 MED ORDER — PRO-STAT SUGAR FREE PO LIQD
30.0000 mL | Freq: Three times a day (TID) | ORAL | Status: DC
  Administered 2016-01-09 – 2016-01-12 (×9): 30 mL
  Filled 2016-01-09 (×8): qty 30

## 2016-01-09 MED ORDER — PIVOT 1.5 CAL PO LIQD
1000.0000 mL | ORAL | Status: DC
  Administered 2016-01-09: 1000 mL
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
## 2016-01-09 NOTE — Progress Notes (Signed)
Patient ID: [name], female   DOB: 06/05/1971, 45 y.o.   MRN: [id number] Neuro stable intubated  New ct head shows new in blood. No changes in her treatment

## 2016-01-09 NOTE — Progress Notes (Signed)
Patient ID: [name], female   DOB: 12/28/1970, 45 y.o.   MRN: [id number] I called her daughter, [name], and updated he plan of care. I answered her questions. Violeta GelinasBurke Ringo Sherod, MD, MPH, FACS Trauma: [phone] General Surgery: [phone]
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
## 2016-01-10 ENCOUNTER — Inpatient Hospital Stay (HOSPITAL_COMMUNITY): Payer: Medicaid Other

## 2016-01-10 LAB — GLUCOSE, CAPILLARY
GLUCOSE-CAPILLARY: 118 mg/dL — AB (ref 65–99)
GLUCOSE-CAPILLARY: 158 mg/dL — AB (ref 65–99)
GLUCOSE-CAPILLARY: 128 mg/dL — AB (ref 65–99)
GLUCOSE-CAPILLARY: 151 mg/dL — AB (ref 65–99)
Glucose-Capillary: 138 mg/dL — ABNORMAL HIGH (ref 65–99)

## 2016-01-10 LAB — CBC
HEMATOCRIT: 28.4 % — AB (ref 36.0–46.0)
HEMOGLOBIN: 9 g/dL — AB (ref 12.0–15.0)
MCH: 31.1 pg (ref 26.0–34.0)
MCHC: 31.7 g/dL (ref 30.0–36.0)
MCV: 98.3 fL (ref 78.0–100.0)
Platelets: 213 10*3/uL (ref 150–400)
RBC: 2.89 MIL/uL — AB (ref 3.87–5.11)
RDW: 13.7 % (ref 11.5–15.5)
WBC: 7 10*3/uL (ref 4.0–10.5)

## 2016-01-10 LAB — BASIC METABOLIC PANEL
Anion gap: 5 (ref 5–15)
BUN: 6 mg/dL (ref 6–20)
CHLORIDE: 112 mmol/L — AB (ref 101–111)
CO2: 24 mmol/L (ref 22–32)
CREATININE: 0.52 mg/dL (ref 0.44–1.00)
Calcium: 7.9 mg/dL — ABNORMAL LOW (ref 8.9–10.3)
GFR calc non Af Amer: >60 mL/min (ref >60)
Glucose, Bld: 137 mg/dL — ABNORMAL HIGH (ref 65–99)
POTASSIUM: 3.2 mmol/L — AB (ref 3.5–5.1)
Sodium: 141 mmol/L (ref 135–145)

## 2016-01-10 LAB — URINE MICROSCOPIC-ADD ON

## 2016-01-10 LAB — URINALYSIS, ROUTINE W REFLEX MICROSCOPIC
Bilirubin Urine: NEGATIVE
GLUCOSE, UA: NEGATIVE mg/dL
HGB URINE DIPSTICK: NEGATIVE
Ketones, ur: NEGATIVE mg/dL
Nitrite: NEGATIVE
PH: 6 (ref 5.0–8.0)
PROTEIN: NEGATIVE mg/dL
Specific Gravity, Urine: 1.023 (ref 1.005–1.030)

## 2016-01-10 MED ORDER — DEXMEDETOMIDINE HCL IN NACL 400 MCG/100ML IV SOLN
0.4000 ug/kg/h | INTRAVENOUS | Status: DC
  Administered 2016-01-10 (×2): 1.2 ug/kg/h via INTRAVENOUS
  Administered 2016-01-10: 1 ug/kg/h via INTRAVENOUS
  Administered 2016-01-11: 1.2 ug/kg/h via INTRAVENOUS
  Administered 2016-01-11: 2 ug/kg/h via INTRAVENOUS
  Administered 2016-01-11: 1.2 ug/kg/h via INTRAVENOUS
  Administered 2016-01-11: 0.5 ug/kg/h via INTRAVENOUS
  Administered 2016-01-11: 1.2 ug/kg/h via INTRAVENOUS
  Administered 2016-01-11 – 2016-01-12 (×2): 1 ug/kg/h via INTRAVENOUS
  Filled 2016-01-10 (×6): qty 100
  Filled 2016-01-10: qty 50
  Filled 2016-01-10 (×4): qty 100
  Filled 2016-01-10: qty 50
  Filled 2016-01-10 (×3): qty 100

## 2016-01-10 MED ORDER — POTASSIUM CHLORIDE CRYS ER 20 MEQ PO TBCR: 40.0000 meq | EXTENDED_RELEASE_TABLET | Freq: Two times a day (BID) | ORAL | Status: DC

## 2016-01-10 MED ORDER — POTASSIUM CHLORIDE 20 MEQ/15ML (10%) PO SOLN
40.0000 meq | Freq: Two times a day (BID) | ORAL | Status: DC
  Administered 2016-01-10 – 2016-02-05 (×53): 40 meq
  Filled 2016-01-10 (×53): qty 30

## 2016-01-10 MED ORDER — POTASSIUM CHLORIDE 10 MEQ/100ML IV SOLN
10.0000 meq | INTRAVENOUS | Status: AC
  Administered 2016-01-10 (×2): 10 meq via INTRAVENOUS
  Filled 2016-01-10 (×2): qty 100

## 2016-01-10 MED ORDER — FUROSEMIDE 10 MG/ML IJ SOLN
40.0000 mg | Freq: Once | INTRAMUSCULAR | Status: AC
  Administered 2016-01-10: 40 mg via INTRAVENOUS
  Filled 2016-01-10: qty 4

## 2016-01-10 NOTE — Progress Notes (Signed)
Follow up - Trauma and Critical Care  Patient Details:    [name] is an 45 y.o. female.  Lines/tubes : Airway 7.5 mm (Active)  Secured at (cm) 22 cm 01/10/2016  5:04 AM  Measured From Lips 01/10/2016  5:04 AM  Secured Location Right 01/10/2016  5:04 AM  Secured By [employer] 01/10/2016  5:04 AM  Tube Holder Repositioned Yes 01/10/2016 12:03 AM  Cuff Pressure (cm H2O) 25 cm H2O 01/10/2016  5:04 AM  Site Condition Dry 01/10/2016  5:04 AM     NG/OG Tube Orogastric Right mouth (Active)  Placement Verification Auscultation 01/09/2016  8:00 PM  Site Assessment Clean;Dry;Intact 01/09/2016  8:00 PM  Status Suction-low intermittent;Irrigated 01/09/2016  8:00 PM  Drainage Appearance Bile 01/09/2016  8:00 PM  Intake (mL) 30 mL 01/10/2016 12:00 AM  Output (mL) 0 mL 01/09/2016  4:00 AM     Urethral Catheter [name], EMT Non-latex;Temperature probe 14 Fr. (Active)  Indication for Insertion or Continuance of Catheter Unstable critical patients (first 24-48 hours);Unstable spinal/crush injuries 01/10/2016  7:18 AM  Site Assessment Clean;Intact 01/09/2016  8:00 PM  Catheter Maintenance Bag below level of bladder;Catheter secured;Drainage bag/tubing not touching floor;Insertion date on drainage bag;No dependent loops;Seal intact 01/10/2016  7:18 AM  Collection Container Standard drainage bag 01/09/2016  8:00 PM  Securement Method Leg strap 01/09/2016  8:00 PM  Urinary Catheter Interventions Unclamped 01/09/2016  8:00 PM  Input (mL) 50 mL 01/08/2016 12:02 PM  Output (mL) 50 mL 01/10/2016  6:00 AM    Microbiology/Sepsis markers: Results for orders placed or performed during the hospital encounter of 01/06/16  MRSA PCR Screening     Status: None   Collection Time: 01/06/16  1:45 PM  Result Value Ref Range Status   MRSA by PCR NEGATIVE NEGATIVE Final    Comment:        The GeneXpert MRSA Assay (FDA approved for NASAL specimens only), is one component of a comprehensive MRSA  colonization surveillance program. It is not intended to diagnose MRSA infection nor to guide or monitor treatment for MRSA infections.     Anti-infectives:  Anti-infectives    None      Best Practice/Protocols:  VTE Prophylaxis: Mechanical GI Prophylaxis: Proton Pump Inhibitor Continous Sedation propofol and fentanyl  Consults: Treatment Team:  Trauma Md, MD Sheral Apley, MD Hilda Lias, MD    Events:  Subjective:    Overnight Issues: Patient gets very agitated and works against her getting extubated.  Objective:  Vital signs for last 24 hours: Temp:  [97.7 F (36.5 C)-100.4 F (38 C)] 99.7 F (37.6 C) (05/24 0700) Pulse Rate:  [72-116] 90 (05/24 0700) Resp:  [16-20] 20 (05/24 0700) BP: (98-139)/(47-85) 98/47 mmHg (05/24 0700) SpO2:  [92 %-100 %] 99 % (05/24 0700) FiO2 (%):  [40 %] 40 % (05/24 0504) Weight:  [83.1 kg (183 lb 3.2 oz)] 83.1 kg (183 lb 3.2 oz) (05/24 0200)  Hemodynamic parameters for last 24 hours:    Intake/Output from previous day: 05/23 0701 - 05/24 0700 In: 4917.2 [I.V.:3874.5; NG/GT:942.7; IV Piggyback:100] Out: 1595 [Urine:1595]  Intake/Output this shift:    Vent settings for last 24 hours: Vent Mode:  [-] PRVC FiO2 (%):  [40 %] 40 % Set Rate:  [20 bmp] 20 bmp Vt Set:  [480 mL] 480 mL PEEP:  [5 cmH20] 5 cmH20 Plateau Pressure:  [21 cmH20-24 cmH20] 21 cmH20  Physical Exam:  General: no respiratory distress Neuro: nonfocal exam and RASS -1 Resp:  clear to auscultation bilaterally CVS: regular rate and rhythm, S1, S2 normal, no murmur, click, rub or gallop GI: soft, nontender, BS WNL, no r/g and Tolerating tube feedings well Extremities: no edema, no erythema, pulses WNL  Results for orders placed or performed during the hospital encounter of 01/06/16 (from the past 24 hour(s))  Triglycerides     Status: Abnormal   Collection Time: 01/09/16  9:08 AM  Result Value Ref Range   Triglycerides 264 (H) <150 mg/dL   Glucose, capillary     Status: None   Collection Time: 01/09/16 11:38 AM  Result Value Ref Range   Glucose-Capillary 87 65 - 99 mg/dL   Comment 1 Notify RN   Glucose, capillary     Status: None   Collection Time: 01/09/16  3:30 PM  Result Value Ref Range   Glucose-Capillary 98 65 - 99 mg/dL   Comment 1 Notify RN   Glucose, capillary     Status: Abnormal   Collection Time: 01/09/16  7:17 PM  Result Value Ref Range   Glucose-Capillary 123 (H) 65 - 99 mg/dL   Comment 1 Capillary Specimen    Comment 2 Notify RN    Comment 3 Document in Chart   Glucose, capillary     Status: Abnormal   Collection Time: 01/09/16 11:25 PM  Result Value Ref Range   Glucose-Capillary 145 (H) 65 - 99 mg/dL   Comment 1 Capillary Specimen    Comment 2 Notify RN    Comment 3 Document in Chart   CBC     Status: Abnormal   Collection Time: 01/10/16  3:20 AM  Result Value Ref Range   WBC 7.0 4.0 - 10.5 K/uL   RBC 2.89 (L) 3.87 - 5.11 MIL/uL   Hemoglobin 9.0 (L) 12.0 - 15.0 g/dL   HCT 40.9 (L) 81.1 - 91.4 %   MCV 98.3 78.0 - 100.0 fL   MCH 31.1 26.0 - 34.0 pg   MCHC 31.7 30.0 - 36.0 g/dL   RDW 78.2 95.6 - 21.3 %   Platelets 213 150 - 400 K/uL  Basic metabolic panel     Status: Abnormal   Collection Time: 01/10/16  3:20 AM  Result Value Ref Range   Sodium 141 135 - 145 mmol/L   Potassium 3.2 (L) 3.5 - 5.1 mmol/L   Chloride 112 (H) 101 - 111 mmol/L   CO2 24 22 - 32 mmol/L   Glucose, Bld 137 (H) 65 - 99 mg/dL   BUN 6 6 - 20 mg/dL   Creatinine, Ser 0.86 0.44 - 1.00 mg/dL   Calcium 7.9 (L) 8.9 - 10.3 mg/dL   GFR calc non Af Amer >60 >60 mL/min   GFR calc Af Amer >60 >60 mL/min   Anion gap 5 5 - 15  Glucose, capillary     Status: Abnormal   Collection Time: 01/10/16  3:49 AM  Result Value Ref Range   Glucose-Capillary 118 (H) 65 - 99 mg/dL   Comment 1 Capillary Specimen    Comment 2 Notify RN    Comment 3 Document in Chart   Glucose, capillary     Status: Abnormal   Collection Time: 01/10/16   7:41 AM  Result Value Ref Range   Glucose-Capillary 158 (H) 65 - 99 mg/dL   Comment 1 Arterial Specimen      Assessment/Plan:   NEURO  Altered Mental Status:  agitation, delirium and sedation   Plan: appropriate weaning of medication, try to switch to Precedex  PULM  Atelectasis/collapse (focal and bibasilar with effusions also)   Plan: Lasix and weaning as appropriate  CARDIO  No significant issues   Plan: CPM  RENAL  No significant issues   Plan: Lasix to increase urine output, but now is adequate  GI  No issues   Plan: CPM  ID  No known ifectious sources   Plan: CPM  HEME  Anemia acute blood loss anemia)   Plan: Better but still low..  No blood to be given  ENDO NO significant issues   Plan: CPM  Global Issues  Patient with head injury, but not able to wean ventilator because of severe agitation.  Will try Precedex.  Also will give some Lasix for fluid overload.  I did look for family who were not arund tie AM    LOS: 4 days   Additional comments:I reviewed the patient's new clinical lab test results. cbc/bmet and I reviewed the patients new imaging test results. cxr  Critical Care Total Time*: 30 Minutes  Savion Washam 01/10/2016  *Care during the described time interval was provided by me and/or other providers on the critical care team.  I have reviewed this patient's available data, including medical history, events of note, physical examination and test results as part of my evaluation.

## 2016-01-11 ENCOUNTER — Inpatient Hospital Stay (HOSPITAL_COMMUNITY): Payer: Medicaid Other

## 2016-01-11 LAB — BASIC METABOLIC PANEL
ANION GAP: 5 (ref 5–15)
BUN: 15 mg/dL (ref 6–20)
CHLORIDE: 115 mmol/L — AB (ref 101–111)
CO2: 23 mmol/L (ref 22–32)
Calcium: 8 mg/dL — ABNORMAL LOW (ref 8.9–10.3)
Creatinine, Ser: 0.62 mg/dL (ref 0.44–1.00)
GFR calc Af Amer: >60 mL/min (ref >60)
GLUCOSE: 138 mg/dL — AB (ref 65–99)
POTASSIUM: 4.5 mmol/L (ref 3.5–5.1)
Sodium: 143 mmol/L (ref 135–145)

## 2016-01-11 LAB — GLUCOSE, CAPILLARY
GLUCOSE-CAPILLARY: 134 mg/dL — AB (ref 65–99)
GLUCOSE-CAPILLARY: 155 mg/dL — AB (ref 65–99)
Glucose-Capillary: 137 mg/dL — ABNORMAL HIGH (ref 65–99)
Glucose-Capillary: 154 mg/dL — ABNORMAL HIGH (ref 65–99)
Glucose-Capillary: 155 mg/dL — ABNORMAL HIGH (ref 65–99)
Glucose-Capillary: 159 mg/dL — ABNORMAL HIGH (ref 65–99)

## 2016-01-11 LAB — CBC WITH DIFFERENTIAL/PLATELET
BASOS ABS: 0 10*3/uL (ref 0.0–0.1)
Basophils Relative: 0 %
Eosinophils Absolute: 0.2 10*3/uL (ref 0.0–0.7)
Eosinophils Relative: 2 %
HEMATOCRIT: 27.8 % — AB (ref 36.0–46.0)
HEMOGLOBIN: 8.7 g/dL — AB (ref 12.0–15.0)
LYMPHS ABS: 1.3 10*3/uL (ref 0.7–4.0)
LYMPHS PCT: 19 %
MCH: 31.3 pg (ref 26.0–34.0)
MCHC: 31.3 g/dL (ref 30.0–36.0)
MCV: 100 fL (ref 78.0–100.0)
Monocytes Absolute: 0.5 10*3/uL (ref 0.1–1.0)
Monocytes Relative: 7 %
NEUTROS ABS: 4.9 10*3/uL (ref 1.7–7.7)
NEUTROS PCT: 72 %
PLATELETS: 198 10*3/uL (ref 150–400)
RBC: 2.78 MIL/uL — AB (ref 3.87–5.11)
RDW: 13.9 % (ref 11.5–15.5)
WBC: 6.8 10*3/uL (ref 4.0–10.5)

## 2016-01-11 LAB — POCT I-STAT 3, ART BLOOD GAS (G3+)
ACID-BASE DEFICIT: 1 mmol/L (ref 0.0–2.0)
BICARBONATE: 24.5 meq/L — AB (ref 20.0–24.0)
O2 SAT: 95 %
PO2 ART: 96 mmHg (ref 80.0–100.0)
TCO2: 26 mmol/L (ref 0–100)
pCO2 arterial: 49.4 mmHg — ABNORMAL HIGH (ref 35.0–45.0)
pH, Arterial: 7.315 — ABNORMAL LOW (ref 7.350–7.450)

## 2016-01-11 MED ORDER — VECURONIUM BOLUS VIA INFUSION
10.0000 mg | Freq: Once | INTRAVENOUS | Status: AC
  Administered 2016-01-11: 10 mg via INTRAVENOUS
  Filled 2016-01-11: qty 10

## 2016-01-11 MED ORDER — LORAZEPAM 2 MG/ML IJ SOLN: 1.0000 mg | INTRAMUSCULAR | Status: DC | PRN

## 2016-01-11 MED ORDER — SODIUM CHLORIDE 0.9 % IV SOLN
500.0000 mg | Freq: Four times a day (QID) | INTRAVENOUS | Status: DC
  Administered 2016-01-11 – 2016-01-16 (×20): 500 mg via INTRAVENOUS
  Filled 2016-01-11 (×22): qty 500

## 2016-01-11 MED ORDER — VANCOMYCIN HCL IN DEXTROSE 1-5 GM/200ML-% IV SOLN
1000.0000 mg | Freq: Three times a day (TID) | INTRAVENOUS | Status: DC
  Administered 2016-01-11 – 2016-01-13 (×5): 1000 mg via INTRAVENOUS
  Filled 2016-01-11 (×7): qty 200

## 2016-01-11 MED ORDER — VECURONIUM BROMIDE 10 MG IV SOLR
0.8000 ug/kg/min | INTRAVENOUS | Status: DC
  Administered 2016-01-11: 0.8 ug/kg/min via INTRAVENOUS
  Administered 2016-01-12: 1.7 ug/kg/min via INTRAVENOUS
  Filled 2016-01-11 (×3): qty 100

## 2016-01-11 MED ORDER — FENTANYL CITRATE (PF) 100 MCG/2ML IJ SOLN: 100.0000 ug | Freq: Once | INTRAMUSCULAR | Status: DC | PRN

## 2016-01-11 MED ORDER — FENTANYL CITRATE (PF) 100 MCG/2ML IJ SOLN: 50.0000 ug | INTRAMUSCULAR | Status: DC | PRN

## 2016-01-11 MED ORDER — FENTANYL CITRATE (PF) 2500 MCG/50ML IJ SOLN
100.0000 ug/h | INTRAMUSCULAR | Status: DC
  Administered 2016-01-11 – 2016-01-12 (×2): 300 ug/h via INTRAVENOUS
  Administered 2016-01-13: 175 ug/h via INTRAVENOUS
  Administered 2016-01-14: 150 ug/h via INTRAVENOUS
  Administered 2016-01-15: 75 ug/h via INTRAVENOUS
  Administered 2016-01-16: 100 ug/h via INTRAVENOUS
  Administered 2016-01-16: 400 ug/h via INTRAVENOUS
  Administered 2016-01-16: 150 ug/h via INTRAVENOUS
  Administered 2016-01-17 – 2016-01-19 (×6): 300 ug/h via INTRAVENOUS
  Administered 2016-01-20: 100 ug/h via INTRAVENOUS
  Administered 2016-01-20: 200 ug/h via INTRAVENOUS
  Administered 2016-01-22: 100 ug/h via INTRAVENOUS
  Administered 2016-01-24: 40 ug/h via INTRAVENOUS
  Administered 2016-01-25 – 2016-01-28 (×3): 50 ug/h via INTRAVENOUS
  Filled 2016-01-11 (×27): qty 50

## 2016-01-11 MED ORDER — SODIUM CHLORIDE 0.9 % IV SOLN
250.0000 mg | Freq: Once | INTRAVENOUS | Status: AC
  Administered 2016-01-11: 250 mg via INTRAVENOUS
  Filled 2016-01-11: qty 250

## 2016-01-11 MED ORDER — VANCOMYCIN HCL 10 G IV SOLR
1500.0000 mg | Freq: Once | INTRAVENOUS | Status: AC
  Administered 2016-01-11: 1500 mg via INTRAVENOUS
  Filled 2016-01-11: qty 1500

## 2016-01-11 MED ORDER — FENTANYL CITRATE (PF) 100 MCG/2ML IJ SOLN
100.0000 ug | Freq: Once | INTRAMUSCULAR | Status: AC
  Administered 2016-01-11: 100 ug via INTRAVENOUS

## 2016-01-11 MED ORDER — SODIUM CHLORIDE 0.9 % IV SOLN: INTRAVENOUS | Status: DC | PRN

## 2016-01-11 MED ORDER — ARTIFICIAL TEARS OP OINT
1.0000 "application " | TOPICAL_OINTMENT | Freq: Three times a day (TID) | OPHTHALMIC | Status: DC
  Administered 2016-01-11 – 2016-01-13 (×6): 1 via OPHTHALMIC
  Filled 2016-01-11 (×2): qty 3.5

## 2016-01-11 MED ORDER — LIDOCAINE HCL (PF) 1 % IJ SOLN
INTRAMUSCULAR | Status: AC
  Administered 2016-01-11: 1 mL
  Filled 2016-01-11: qty 30

## 2016-01-11 MED ORDER — FENTANYL BOLUS VIA INFUSION
50.0000 ug | INTRAVENOUS | Status: DC | PRN
  Administered 2016-01-12 – 2016-01-27 (×14): 50 ug via INTRAVENOUS
  Filled 2016-01-11: qty 50

## 2016-01-11 NOTE — Procedures (Signed)
Central Venous Catheter Insertion Procedure Note Frederich ChickCrystal D Fabio [id number] 04/16/1971  Procedure: Insertion of Central Venous Catheter Indications: Assessment of intravascular volume  Procedure Details Consent: Risks of procedure as well as the alternatives and risks of each were explained to the (patient/caregiver).  Consent for procedure obtained. Time Out: Verified patient identification, verified procedure, site/side was marked, verified correct patient position, special equipment/implants available, medications/allergies/relevent history reviewed, required imaging and test results available.  Performed  Maximum sterile technique was used including antiseptics, cap, gloves, gown, hand hygiene, mask and sheet. Skin prep: Chlorhexidine; local anesthetic administered A antimicrobial bonded/coated triple lumen catheter was placed in the left subclavian vein using the Seldinger technique.  Evaluation Blood flow good Complications: No apparent complications Patient did tolerate procedure well. Chest X-ray ordered to verify placement.  CXR: pending.  Jovante Hammitt E 01/11/2016, 8:39 AM

## 2016-01-11 NOTE — Progress Notes (Signed)
Patient continues to drop O2 sats into the 80's with periods of up to several minutes to recover, SBP increased and HR dipping into the 50's.  Pt is nonsynchronous with the vent.  Notified Dr. Leonard SchwartzB. Thomas, orders received to start neuromuscular blockade.

## 2016-01-11 NOTE — Procedures (Signed)
Chest Tube Insertion Procedure Note  Pre-operative Diagnosis: L PTX  Post-operative Diagnosis: L PTX  Procedure Details  Informed consent was obtained for the procedure from her father by phone, including sedation.  Risks of lung perforation, hemorrhage, arrhythmia, and adverse drug reaction were discussed.   After sterile skin prep, using standard technique, a 28 French tube was placed in the L ant axillary line nipple level  Findings: Rush of air and fluid  Estimated Blood Loss:  200 mL         Specimens:  None              Complications:  None; patient tolerated the procedure well.         Disposition: ICU - intubated and critically ill.         Condition: stable  April GelinasBurke Odessie Polzin, MD, MPH, FACS Trauma: [phone] General Surgery: [phone]

## 2016-01-11 NOTE — Progress Notes (Signed)
200ml fentanyl wasted in sink.  Witnessed by Edilia Bohristine Hinshaw RN

## 2016-01-11 NOTE — Procedures (Signed)
Arterial Catheter Insertion Procedure Note [name] [id number] 02/23/1971  Procedure: Insertion of Arterial Catheter  Indications: Blood pressure monitoring and Frequent blood sampling  Procedure Details Consent: Risks of procedure as well as the alternatives and risks of each were explained to the (patient/caregiver).  Consent for procedure obtained. Time Out: Verified patient identification, verified procedure, site/side was marked, verified correct patient position, special equipment/implants available, medications/allergies/relevent history reviewed, required imaging and test results available.  Performed  Maximum sterile technique was used including antiseptics, cap, gloves, gown, hand hygiene, mask and sheet. Skin prep: Chlorhexidine; local anesthetic administered 20 gauge catheter was inserted into right radial artery using the Seldinger technique.  Evaluation Blood flow good; BP tracing good. Complications: No apparent complications   Consent was obtained with central line consent per RN. Attempts x2 per [name], RRT, Aline zeroed, secured and dressed per policy. RT to monitor as needed   [name], [name] 01/11/2016
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
## 2016-01-11 NOTE — Progress Notes (Signed)
Radiology called with CXR results; pt agitated and O2 Sats in the 80's.  Dr. Janee Mornhompson to bedside, consent obtained for left chest tube, central line and arterial line placement.
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
## 2016-01-12 ENCOUNTER — Inpatient Hospital Stay (HOSPITAL_COMMUNITY): Payer: Medicaid Other

## 2016-01-12 DIAGNOSIS — J939 Pneumothorax, unspecified: Secondary | ICD-10-CM

## 2016-01-12 DIAGNOSIS — J80 Acute respiratory distress syndrome: Secondary | ICD-10-CM

## 2016-01-12 LAB — BLOOD CULTURE ID PANEL (REFLEXED)
Acinetobacter baumannii: NOT DETECTED
CANDIDA GLABRATA: NOT DETECTED
CANDIDA KRUSEI: NOT DETECTED
CANDIDA TROPICALIS: NOT DETECTED
Candida albicans: NOT DETECTED
Candida parapsilosis: NOT DETECTED
Carbapenem resistance: NOT DETECTED
ESCHERICHIA COLI: NOT DETECTED
Enterobacter cloacae complex: NOT DETECTED
Enterobacteriaceae species: NOT DETECTED
Enterococcus species: NOT DETECTED
HAEMOPHILUS INFLUENZAE: NOT DETECTED
Klebsiella oxytoca: NOT DETECTED
Klebsiella pneumoniae: NOT DETECTED
LISTERIA MONOCYTOGENES: NOT DETECTED
METHICILLIN RESISTANCE: DETECTED — AB
Neisseria meningitidis: NOT DETECTED
PROTEUS SPECIES: NOT DETECTED
Pseudomonas aeruginosa: NOT DETECTED
SERRATIA MARCESCENS: NOT DETECTED
STREPTOCOCCUS PYOGENES: NOT DETECTED
Staphylococcus aureus (BCID): NOT DETECTED
Staphylococcus species: DETECTED — AB
Streptococcus agalactiae: NOT DETECTED
Streptococcus pneumoniae: NOT DETECTED
Streptococcus species: NOT DETECTED
Vancomycin resistance: NOT DETECTED

## 2016-01-12 LAB — GLUCOSE, CAPILLARY
GLUCOSE-CAPILLARY: 159 mg/dL — AB (ref 65–99)
Glucose-Capillary: 122 mg/dL — ABNORMAL HIGH (ref 65–99)
Glucose-Capillary: 135 mg/dL — ABNORMAL HIGH (ref 65–99)
Glucose-Capillary: 148 mg/dL — ABNORMAL HIGH (ref 65–99)
Glucose-Capillary: 174 mg/dL — ABNORMAL HIGH (ref 65–99)
Glucose-Capillary: 177 mg/dL — ABNORMAL HIGH (ref 65–99)

## 2016-01-12 LAB — CBC
HEMATOCRIT: 23.1 % — AB (ref 36.0–46.0)
Hemoglobin: 7.2 g/dL — ABNORMAL LOW (ref 12.0–15.0)
MCH: 31.9 pg (ref 26.0–34.0)
MCHC: 31.2 g/dL (ref 30.0–36.0)
MCV: 102.2 fL — ABNORMAL HIGH (ref 78.0–100.0)
PLATELETS: 180 10*3/uL (ref 150–400)
RBC: 2.26 MIL/uL — ABNORMAL LOW (ref 3.87–5.11)
RDW: 13.9 % (ref 11.5–15.5)
WBC: 7.1 10*3/uL (ref 4.0–10.5)

## 2016-01-12 LAB — POCT I-STAT 3, ART BLOOD GAS (G3+)
ACID-BASE EXCESS: 3 mmol/L — AB (ref 0.0–2.0)
Acid-Base Excess: 1 mmol/L (ref 0.0–2.0)
Acid-base deficit: 1 mmol/L (ref 0.0–2.0)
BICARBONATE: 24.3 meq/L — AB (ref 20.0–24.0)
Bicarbonate: 26.5 meq/L — ABNORMAL HIGH (ref 20.0–24.0)
Bicarbonate: 29 meq/L — ABNORMAL HIGH (ref 20.0–24.0)
O2 SAT: 90 %
O2 SAT: 93 %
O2 SAT: 93 %
PCO2 ART: 43.8 mmHg (ref 35.0–45.0)
PCO2 ART: 46.6 mmHg — AB (ref 35.0–45.0)
PCO2 ART: 56 mmHg — AB (ref 35.0–45.0)
PH ART: 7.33 — AB (ref 7.350–7.450)
PO2 ART: 65 mmHg — AB (ref 80.0–100.0)
PO2 ART: 74 mmHg — AB (ref 80.0–100.0)
PO2 ART: 81 mmHg (ref 80.0–100.0)
Patient temperature: 38
Patient temperature: 38.4
Patient temperature: 38.6
TCO2: 26 mmol/L (ref 0–100)
TCO2: 28 mmol/L (ref 0–100)
TCO2: 31 mmol/L (ref 0–100)
pH, Arterial: 7.356 (ref 7.350–7.450)
pH, Arterial: 7.368 (ref 7.350–7.450)

## 2016-01-12 LAB — BLOOD GAS, ARTERIAL
Acid-base deficit: 0.7 mmol/L (ref 0.0–2.0)
BICARBONATE: 24 meq/L (ref 20.0–24.0)
Drawn by: 364961
FIO2: 0.4
O2 Saturation: 96.9 %
PATIENT TEMPERATURE: 100.4
PCO2 ART: 45.4 mmHg — AB (ref 35.0–45.0)
PEEP: 8 cmH2O
PO2 ART: 97.7 mmHg (ref 80.0–100.0)
RATE: 22 resp/min
TCO2: 25.3 mmol/L (ref 0–100)
VT: 480 mL
pH, Arterial: 7.349 — ABNORMAL LOW (ref 7.350–7.450)

## 2016-01-12 LAB — BASIC METABOLIC PANEL
Anion gap: 4 — ABNORMAL LOW (ref 5–15)
BUN: 12 mg/dL (ref 6–20)
CALCIUM: 7.4 mg/dL — AB (ref 8.9–10.3)
CO2: 26 mmol/L (ref 22–32)
CREATININE: 0.58 mg/dL (ref 0.44–1.00)
Chloride: 110 mmol/L (ref 101–111)
Glucose, Bld: 277 mg/dL — ABNORMAL HIGH (ref 65–99)
Potassium: 4.5 mmol/L (ref 3.5–5.1)
Sodium: 140 mmol/L (ref 135–145)

## 2016-01-12 MED ORDER — FUROSEMIDE 10 MG/ML IJ SOLN
40.0000 mg | Freq: Two times a day (BID) | INTRAMUSCULAR | Status: AC
  Administered 2016-01-12 – 2016-01-13 (×4): 40 mg via INTRAVENOUS
  Filled 2016-01-12 (×4): qty 4

## 2016-01-12 MED ORDER — INSULIN ASPART 100 UNIT/ML ~~LOC~~ SOLN
0.0000 [IU] | SUBCUTANEOUS | Status: DC
  Administered 2016-01-12: 3 [IU] via SUBCUTANEOUS
  Administered 2016-01-12: 2 [IU] via SUBCUTANEOUS
  Administered 2016-01-12: 3 [IU] via SUBCUTANEOUS
  Administered 2016-01-12: 2 [IU] via SUBCUTANEOUS
  Administered 2016-01-13 (×2): 3 [IU] via SUBCUTANEOUS
  Administered 2016-01-13: 2 [IU] via SUBCUTANEOUS
  Administered 2016-01-13: 3 [IU] via SUBCUTANEOUS
  Administered 2016-01-13: 2 [IU] via SUBCUTANEOUS
  Administered 2016-01-13: 3 [IU] via SUBCUTANEOUS
  Administered 2016-01-14: 2 [IU] via SUBCUTANEOUS
  Administered 2016-01-14: 3 [IU] via SUBCUTANEOUS
  Administered 2016-01-14 (×3): 2 [IU] via SUBCUTANEOUS
  Administered 2016-01-14 – 2016-01-15 (×3): 3 [IU] via SUBCUTANEOUS
  Administered 2016-01-15 (×4): 2 [IU] via SUBCUTANEOUS
  Administered 2016-01-16: 3 [IU] via SUBCUTANEOUS
  Administered 2016-01-16: 2 [IU] via SUBCUTANEOUS
  Administered 2016-01-16 (×2): 3 [IU] via SUBCUTANEOUS
  Administered 2016-01-16: 2 [IU] via SUBCUTANEOUS
  Administered 2016-01-16: 3 [IU] via SUBCUTANEOUS
  Administered 2016-01-17 – 2016-01-19 (×11): 2 [IU] via SUBCUTANEOUS
  Administered 2016-01-19 (×2): 3 [IU] via SUBCUTANEOUS
  Administered 2016-01-19 – 2016-01-20 (×3): 2 [IU] via SUBCUTANEOUS
  Administered 2016-01-20: 3 [IU] via SUBCUTANEOUS
  Administered 2016-01-20 – 2016-01-21 (×5): 2 [IU] via SUBCUTANEOUS
  Administered 2016-01-21: 3 [IU] via SUBCUTANEOUS
  Administered 2016-01-21 – 2016-01-22 (×6): 2 [IU] via SUBCUTANEOUS
  Administered 2016-01-22 (×2): 3 [IU] via SUBCUTANEOUS
  Administered 2016-01-23 (×5): 2 [IU] via SUBCUTANEOUS
  Administered 2016-01-23: 3 [IU] via SUBCUTANEOUS
  Administered 2016-01-24: 2 [IU] via SUBCUTANEOUS
  Administered 2016-01-24: 3 [IU] via SUBCUTANEOUS
  Administered 2016-01-25: 2 [IU] via SUBCUTANEOUS
  Administered 2016-01-25 (×2): 3 [IU] via SUBCUTANEOUS
  Administered 2016-01-25 – 2016-01-30 (×20): 2 [IU] via SUBCUTANEOUS
  Administered 2016-01-30: 3 [IU] via SUBCUTANEOUS
  Administered 2016-01-30 – 2016-01-31 (×3): 2 [IU] via SUBCUTANEOUS
  Administered 2016-01-31: 3 [IU] via SUBCUTANEOUS
  Administered 2016-01-31 – 2016-02-01 (×6): 2 [IU] via SUBCUTANEOUS
  Administered 2016-02-02: 3 [IU] via SUBCUTANEOUS
  Administered 2016-02-02 (×3): 2 [IU] via SUBCUTANEOUS
  Administered 2016-02-03: 3 [IU] via SUBCUTANEOUS
  Administered 2016-02-03 – 2016-02-04 (×6): 2 [IU] via SUBCUTANEOUS
  Administered 2016-02-04: 3 [IU] via SUBCUTANEOUS
  Administered 2016-02-04 – 2016-02-05 (×3): 2 [IU] via SUBCUTANEOUS
  Administered 2016-02-05: 3 [IU] via SUBCUTANEOUS

## 2016-01-12 MED ORDER — FUROSEMIDE 10 MG/ML IJ SOLN
40.0000 mg | Freq: Once | INTRAMUSCULAR | Status: AC
  Administered 2016-01-13: 40 mg via INTRAVENOUS
  Filled 2016-01-12: qty 4

## 2016-01-12 MED ORDER — SENNOSIDES 8.8 MG/5ML PO SYRP
5.0000 mL | ORAL_SOLUTION | Freq: Every day | ORAL | Status: DC
  Administered 2016-01-12: 5 mL
  Filled 2016-01-12: qty 5

## 2016-01-12 MED ORDER — MIDAZOLAM BOLUS VIA INFUSION
1.0000 mg | INTRAVENOUS | Status: DC | PRN
  Administered 2016-01-12: 2 mg via INTRAVENOUS
  Filled 2016-01-12 (×2): qty 2

## 2016-01-12 MED ORDER — PIVOT 1.5 CAL PO LIQD
1000.0000 mL | ORAL | Status: DC
  Administered 2016-01-12 – 2016-02-05 (×24): 1000 mL
  Filled 2016-01-12 (×15): qty 1000

## 2016-01-12 MED ORDER — SODIUM CHLORIDE 0.9 % IV SOLN
0.0000 mg/h | INTRAVENOUS | Status: DC
  Administered 2016-01-12: 8 mg/h via INTRAVENOUS
  Administered 2016-01-12: 5 mg/h via INTRAVENOUS
  Administered 2016-01-12 – 2016-01-13 (×2): 10 mg/h via INTRAVENOUS
  Administered 2016-01-13: 3 mg/h via INTRAVENOUS
  Filled 2016-01-12 (×3): qty 10
  Filled 2016-01-12 (×2): qty 20

## 2016-01-12 MED ORDER — LORAZEPAM 2 MG/ML IJ SOLN
2.0000 mg | Freq: Once | INTRAMUSCULAR | Status: AC
  Administered 2016-01-12: 2 mg via INTRAVENOUS
  Filled 2016-01-12: qty 1

## 2016-01-12 MED ORDER — ALBUTEROL SULFATE (2.5 MG/3ML) 0.083% IN NEBU
2.5000 mg | INHALATION_SOLUTION | RESPIRATORY_TRACT | Status: DC
  Administered 2016-01-12 – 2016-01-13 (×7): 2.5 mg via RESPIRATORY_TRACT
  Filled 2016-01-12 (×7): qty 3

## 2016-01-12 NOTE — Progress Notes (Signed)
PHARMACY - PHYSICIAN COMMUNICATION CRITICAL VALUE ALERT - BLOOD CULTURE IDENTIFICATION (BCID)  Results for orders placed or performed during the hospital encounter of 01/06/16  Blood Culture ID Panel (Reflexed) (Collected: 01/11/2016  9:57 AM)  Result Value Ref Range   Enterococcus species NOT DETECTED NOT DETECTED   Vancomycin resistance NOT DETECTED NOT DETECTED   Listeria monocytogenes NOT DETECTED NOT DETECTED   Staphylococcus species DETECTED (A) NOT DETECTED   Staphylococcus aureus NOT DETECTED NOT DETECTED   Methicillin resistance DETECTED (A) NOT DETECTED   Streptococcus species NOT DETECTED NOT DETECTED   Streptococcus agalactiae NOT DETECTED NOT DETECTED   Streptococcus pneumoniae NOT DETECTED NOT DETECTED   Streptococcus pyogenes NOT DETECTED NOT DETECTED   Acinetobacter baumannii NOT DETECTED NOT DETECTED   Enterobacteriaceae species NOT DETECTED NOT DETECTED   Enterobacter cloacae complex NOT DETECTED NOT DETECTED   Escherichia coli NOT DETECTED NOT DETECTED   Klebsiella oxytoca NOT DETECTED NOT DETECTED   Klebsiella pneumoniae NOT DETECTED NOT DETECTED   Proteus species NOT DETECTED NOT DETECTED   Serratia marcescens NOT DETECTED NOT DETECTED   Carbapenem resistance NOT DETECTED NOT DETECTED   Haemophilus influenzae NOT DETECTED NOT DETECTED   Neisseria meningitidis NOT DETECTED NOT DETECTED   Pseudomonas aeruginosa NOT DETECTED NOT DETECTED   Candida albicans NOT DETECTED NOT DETECTED   Candida glabrata NOT DETECTED NOT DETECTED   Candida krusei NOT DETECTED NOT DETECTED   Candida parapsilosis NOT DETECTED NOT DETECTED   Candida tropicalis NOT DETECTED NOT DETECTED    Name of physician (or Provider) Contacted: Dr. [name]  Changes to prescribed antibiotics required: No changes. Continuing vancomycin and cefepime per MD   Harland GermanAndrew Pepper Wyndham, Pharm D 01/12/2016 8:00 AM

## 2016-01-12 NOTE — Progress Notes (Signed)
Patient ID: [name], female   DOB: 03/01/1971, 45 y.o.   MRN: [id number] Remains intubated. Repeat ct head no need for intervention
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
## 2016-01-12 NOTE — Progress Notes (Signed)
Pt with O2 sats in the 70's, HR 140's SBP190.  Notified Trauma service.  Dr. Andrey CampanileWilson returned call.  Orders received for cxr and ativan.  Will continue to monitor and call trauma with CXR results.

## 2016-01-12 NOTE — Progress Notes (Signed)
Patient clinically is moving from pulmonary edema and possible pneumonia to likely ARDS.  Will start ARDSnet protocol.  Marta LamasJames O. Gae BonWyatt, III, MD, FACS [phone] Trauma Surgeon
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
## 2016-01-12 NOTE — Progress Notes (Signed)
RT NOTE:  Chrissie NoaWilliam, RN stated Dr. Jamison NeighborNestor rounded on patient and adjusted vent settings. FiO2 was decreased to 40%. Peep was decreased to 5. ARDS protocol covers changes. Pt tolerating changes.

## 2016-01-13 ENCOUNTER — Inpatient Hospital Stay (HOSPITAL_COMMUNITY): Payer: Medicaid Other

## 2016-01-13 DIAGNOSIS — J80 Acute respiratory distress syndrome: Secondary | ICD-10-CM

## 2016-01-13 DIAGNOSIS — J9601 Acute respiratory failure with hypoxia: Secondary | ICD-10-CM

## 2016-01-13 LAB — VANCOMYCIN, TROUGH: Vancomycin Tr: 11 ug/mL (ref 10.0–20.0)

## 2016-01-13 LAB — GLUCOSE, CAPILLARY
GLUCOSE-CAPILLARY: 198 mg/dL — AB (ref 65–99)
GLUCOSE-CAPILLARY: 157 mg/dL — AB (ref 65–99)
GLUCOSE-CAPILLARY: 173 mg/dL — AB (ref 65–99)
Glucose-Capillary: 125 mg/dL — ABNORMAL HIGH (ref 65–99)
Glucose-Capillary: 140 mg/dL — ABNORMAL HIGH (ref 65–99)
Glucose-Capillary: 148 mg/dL — ABNORMAL HIGH (ref 65–99)
Glucose-Capillary: 158 mg/dL — ABNORMAL HIGH (ref 65–99)

## 2016-01-13 LAB — CBC WITH DIFFERENTIAL/PLATELET
BASOS ABS: 0 10*3/uL (ref 0.0–0.1)
Basophils Relative: 0 %
EOS ABS: 0.1 10*3/uL (ref 0.0–0.7)
EOS PCT: 1 %
HCT: 26.4 % — ABNORMAL LOW (ref 36.0–46.0)
HEMOGLOBIN: 8.2 g/dL — AB (ref 12.0–15.0)
LYMPHS ABS: 0.9 10*3/uL (ref 0.7–4.0)
LYMPHS PCT: 10 %
MCH: 31.9 pg (ref 26.0–34.0)
MCHC: 31.1 g/dL (ref 30.0–36.0)
MCV: 102.7 fL — ABNORMAL HIGH (ref 78.0–100.0)
Monocytes Absolute: 0.7 10*3/uL (ref 0.1–1.0)
Monocytes Relative: 7 %
NEUTROS PCT: 82 %
Neutro Abs: 7.9 10*3/uL — ABNORMAL HIGH (ref 1.7–7.7)
PLATELETS: 238 10*3/uL (ref 150–400)
RBC: 2.57 MIL/uL — AB (ref 3.87–5.11)
RDW: 14.1 % (ref 11.5–15.5)
WBC: 9.5 10*3/uL (ref 4.0–10.5)

## 2016-01-13 LAB — BASIC METABOLIC PANEL
ANION GAP: 7 (ref 5–15)
BUN: 13 mg/dL (ref 6–20)
CHLORIDE: 102 mmol/L (ref 101–111)
CO2: 31 mmol/L (ref 22–32)
Calcium: 8.1 mg/dL — ABNORMAL LOW (ref 8.9–10.3)
Creatinine, Ser: 0.63 mg/dL (ref 0.44–1.00)
GFR calc Af Amer: >60 mL/min (ref >60)
Glucose, Bld: 169 mg/dL — ABNORMAL HIGH (ref 65–99)
POTASSIUM: 4.4 mmol/L (ref 3.5–5.1)
SODIUM: 140 mmol/L (ref 135–145)

## 2016-01-13 LAB — CULTURE, RESPIRATORY

## 2016-01-13 LAB — CULTURE, RESPIRATORY W GRAM STAIN

## 2016-01-13 MED ORDER — IPRATROPIUM-ALBUTEROL 0.5-2.5 (3) MG/3ML IN SOLN
3.0000 mL | RESPIRATORY_TRACT | Status: DC
  Administered 2016-01-13 – 2016-01-22 (×53): 3 mL via RESPIRATORY_TRACT
  Filled 2016-01-13 (×52): qty 3

## 2016-01-13 MED ORDER — SENNOSIDES 8.8 MG/5ML PO SYRP
5.0000 mL | ORAL_SOLUTION | Freq: Two times a day (BID) | ORAL | Status: DC
  Administered 2016-01-14 – 2016-01-18 (×8): 5 mL
  Filled 2016-01-13 (×12): qty 5

## 2016-01-13 MED ORDER — POLYETHYLENE GLYCOL 3350 17 G PO PACK
17.0000 g | PACK | Freq: Every day | ORAL | Status: DC
  Administered 2016-01-13 – 2016-01-18 (×4): 17 g
  Filled 2016-01-13 (×6): qty 1

## 2016-01-13 MED ORDER — VANCOMYCIN HCL 10 G IV SOLR
1250.0000 mg | Freq: Three times a day (TID) | INTRAVENOUS | Status: DC
Start: 2016-01-13 — End: 2016-01-16
  Administered 2016-01-13 – 2016-01-16 (×9): 1250 mg via INTRAVENOUS
  Filled 2016-01-13 (×11): qty 1250
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
## 2016-01-13 NOTE — Progress Notes (Signed)
ET tube advanced 2 cm, per MD order, from 23 cm to 25 cm at the lip.  RT will continue to monitor.

## 2016-01-13 NOTE — Progress Notes (Signed)
ANTIBIOTIC CONSULT NOTE  Pharmacy Consult for Vanco/Primaxin Indication: Bacteremia, PNA  Allergies  Allergen Reactions  . Latex Hives  . Penicillins     Has patient had a PCN reaction causing immediate rash, facial/tongue/throat swelling, SOB or lightheadedness with hypotension: Yes Has patient had a PCN reaction causing severe rash involving mucus membranes or skin necrosis: UNKNOWN (ACTIVE ALLERGY, per sister) Has patient had a PCN reaction that required hospitalization: UNKNOWN (ACTIVE ALLERGY, per sister) Has patient had a PCN reaction occurring within the last 10 years: UNKNOWN (ACTIVE ALLERGY, per sister) If all of the above answers are "NO",     Patient Measurements: Height: 5\' 7"  (170.2 cm) Weight: 190 lb 7.6 oz (86.4 kg) IBW/kg (Calculated) : 61.6 Adjusted Body Weight:    Vital Signs: Temp: 102.2 F (39 C) (05/27 0700) Temp Source: Core (Comment) (05/27 0020) BP: 128/61 mmHg (05/27 0728) Pulse Rate: 115 (05/27 0728) Intake/Output from previous day: 05/26 0701 - 05/27 0700 In: 4274 [I.V.:2249; NG/GT:1195; IV Piggyback:800] Out: 8300 [Urine:8300] Intake/Output from this shift:    Labs:  Recent Labs  01/11/16 0249 01/12/16 0351 01/13/16 0245  WBC 6.8 7.1 9.5  HGB 8.7* 7.2* 8.2*  PLT 198 180 238  CREATININE 0.62 0.58 0.63   Estimated Creatinine Clearance: 101.3 mL/min (by C-G formula based on Cr of 0.63).  Recent Labs  01/13/16 0845  VANCOTROUGH 11     Medical History: No past medical history on file.  Assessment:  ID: Tmax 102.2, WBC 6.8>7.1>9.5. Abx for PNA and bacteremia.  5/24 TA >> Enterobacter aerogenes R Ancef 5/25 BCx >> CNS x 1, GPC x the other: 5/20 MRSA PCR negative  Primaxin 5/25>> Vanco 5/25>> --5/27 VT=11   Goal of Therapy:  Vancomycin trough level 15-20 mcg/ml  Plan:  --Vancomycin increase to 1250mg  IV q8hr. (Would probably keep the Vanco with CNS/GPC in both her blood cultures) --Imipenem 500 mg IV q6h   April S.  Merilynn Wong, PharmD, BCPS Clinical Staff Pharmacist Pager [phone]  Misty Stanleyobertson, [name] 01/13/2016,11:11 AM

## 2016-01-13 NOTE — Progress Notes (Signed)
RT NOTE:  Recruitment maneuver done following RN call of patient desaturation to 72%. 100% O2 breaths given and FiO2 increased from 40 to 50%. Recruitment done without complication. Pt tolerated well and remained hemodynamically stable throughout. Sats increased from 91% to 100%. RN aware to call RT is desaturation continues.

## 2016-01-14 ENCOUNTER — Inpatient Hospital Stay (HOSPITAL_COMMUNITY): Payer: Medicaid Other

## 2016-01-14 DIAGNOSIS — J939 Pneumothorax, unspecified: Secondary | ICD-10-CM

## 2016-01-14 LAB — CBC
HEMATOCRIT: 22.2 % — AB (ref 36.0–46.0)
HEMOGLOBIN: 6.8 g/dL — AB (ref 12.0–15.0)
MCH: 31.2 pg (ref 26.0–34.0)
MCHC: 30.6 g/dL (ref 30.0–36.0)
MCV: 101.8 fL — AB (ref 78.0–100.0)
Platelets: 246 10*3/uL (ref 150–400)
RBC: 2.18 MIL/uL — AB (ref 3.87–5.11)
RDW: 14.3 % (ref 11.5–15.5)
WBC: 8.2 10*3/uL (ref 4.0–10.5)

## 2016-01-14 LAB — BASIC METABOLIC PANEL
ANION GAP: 4 — AB (ref 5–15)
BUN: 17 mg/dL (ref 6–20)
CHLORIDE: 102 mmol/L (ref 101–111)
CO2: 36 mmol/L — AB (ref 22–32)
Calcium: 8.1 mg/dL — ABNORMAL LOW (ref 8.9–10.3)
Creatinine, Ser: 0.57 mg/dL (ref 0.44–1.00)
GFR calc non Af Amer: >60 mL/min (ref >60)
Glucose, Bld: 190 mg/dL — ABNORMAL HIGH (ref 65–99)
POTASSIUM: 4.4 mmol/L (ref 3.5–5.1)
Sodium: 142 mmol/L (ref 135–145)

## 2016-01-14 LAB — GLUCOSE, CAPILLARY
GLUCOSE-CAPILLARY: 181 mg/dL — AB (ref 65–99)
GLUCOSE-CAPILLARY: 148 mg/dL — AB (ref 65–99)
GLUCOSE-CAPILLARY: 153 mg/dL — AB (ref 65–99)
GLUCOSE-CAPILLARY: 127 mg/dL — AB (ref 65–99)
GLUCOSE-CAPILLARY: 129 mg/dL — AB (ref 65–99)
Glucose-Capillary: 150 mg/dL — ABNORMAL HIGH (ref 65–99)

## 2016-01-14 LAB — PREPARE RBC (CROSSMATCH)

## 2016-01-14 MED ORDER — SODIUM CHLORIDE 0.9 % IV SOLN
Freq: Once | INTRAVENOUS | Status: AC
  Administered 2016-01-14: 06:00:00 via INTRAVENOUS

## 2016-01-14 MED ORDER — BISACODYL 10 MG RE SUPP
10.0000 mg | Freq: Once | RECTAL | Status: DC
  Filled 2016-01-14: qty 1
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
## 2016-01-14 NOTE — Progress Notes (Signed)
eLink Physician-Brief Progress Note Patient Name: [name] DOB: 11/14/1970 MRN: [id number]   Date of Service  01/14/2016  HPI/Events of Note  Anemia - Hgb = 6.8.  eICU Interventions  Will order: 1. Transfuse 1 unit PRBC now.     Intervention Category Intermediate Interventions: Other:  Sommer,Steven Dennard [place]ugene 01/14/2016, 5:50 AM
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
## 2016-01-14 NOTE — Progress Notes (Signed)
75 mls of versed wasted in med room with Jeremy Johannara Parker, RN.  Hermina BartersBOWMAN, Dailyn Kempner M, RN
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
## 2016-01-14 NOTE — Progress Notes (Signed)
CRITICAL VALUE ALERT  Critical value received:  Hemoglobin 6.8  Date of notification:  01/14/2016  Time of notification:  0520  Critical value read back:Yes.    Nurse who received alert:  Breyton Vanscyoc   MD notified (1st page):  Elink, Dr. Arsenio LoaderSommer  Time of first page:  0521  MD notified (2nd page):  Time of second page:  Responding MD:  Dr. Arsenio LoaderSommer  Time MD responded:  [phone]

## 2016-01-15 ENCOUNTER — Inpatient Hospital Stay (HOSPITAL_COMMUNITY): Payer: Medicaid Other

## 2016-01-15 DIAGNOSIS — G9340 Encephalopathy, unspecified: Secondary | ICD-10-CM

## 2016-01-15 LAB — GLUCOSE, CAPILLARY
GLUCOSE-CAPILLARY: 128 mg/dL — AB (ref 65–99)
Glucose-Capillary: 135 mg/dL — ABNORMAL HIGH (ref 65–99)
Glucose-Capillary: 164 mg/dL — ABNORMAL HIGH (ref 65–99)
Glucose-Capillary: 182 mg/dL — ABNORMAL HIGH (ref 65–99)
Glucose-Capillary: 153 mg/dL — ABNORMAL HIGH (ref 65–99)

## 2016-01-15 LAB — BLOOD GAS, ARTERIAL
ACID-BASE EXCESS: 6.2 mmol/L — AB (ref 0.0–2.0)
BICARBONATE: 30.7 meq/L — AB (ref 20.0–24.0)
Drawn by: 441371
FIO2: 0.4
LHR: 28 {breaths}/min
O2 SAT: 97 %
PATIENT TEMPERATURE: 98.6
PCO2 ART: 48.8 mmHg — AB (ref 35.0–45.0)
PEEP/CPAP: 5 cmH2O
PH ART: 7.415 (ref 7.350–7.450)
PO2 ART: 91.4 mmHg (ref 80.0–100.0)
TCO2: 32.2 mmol/L (ref 0–100)
VT: 370 mL

## 2016-01-15 LAB — CULTURE, BLOOD (ROUTINE X 2)

## 2016-01-15 LAB — CBC
HCT: 25.8 % — ABNORMAL LOW (ref 36.0–46.0)
Hemoglobin: 7.9 g/dL — ABNORMAL LOW (ref 12.0–15.0)
MCH: 30 pg (ref 26.0–34.0)
MCHC: 30.6 g/dL (ref 30.0–36.0)
MCV: 98.1 fL (ref 78.0–100.0)
PLATELETS: 300 10*3/uL (ref 150–400)
RBC: 2.63 MIL/uL — ABNORMAL LOW (ref 3.87–5.11)
RDW: 17.2 % — AB (ref 11.5–15.5)
WBC: 9.7 10*3/uL (ref 4.0–10.5)

## 2016-01-15 LAB — BASIC METABOLIC PANEL
Anion gap: 6 (ref 5–15)
BUN: 16 mg/dL (ref 6–20)
CALCIUM: 8.4 mg/dL — AB (ref 8.9–10.3)
CO2: 31 mmol/L (ref 22–32)
CREATININE: 0.46 mg/dL (ref 0.44–1.00)
Chloride: 105 mmol/L (ref 101–111)
GFR calc Af Amer: >60 mL/min (ref >60)
Glucose, Bld: 161 mg/dL — ABNORMAL HIGH (ref 65–99)
Potassium: 4.6 mmol/L (ref 3.5–5.1)
Sodium: 142 mmol/L (ref 135–145)

## 2016-01-15 LAB — TYPE AND SCREEN
ABO/RH(D): A POS
ANTIBODY SCREEN: NEGATIVE
UNIT DIVISION: 0

## 2016-01-15 LAB — VANCOMYCIN, TROUGH: Vancomycin Tr: 14 ug/mL (ref 10.0–20.0)

## 2016-01-15 MED ORDER — ALTEPLASE 2 MG IJ SOLR
2.0000 mg | Freq: Once | INTRAMUSCULAR | Status: AC
  Administered 2016-01-15: 2 mg

## 2016-01-15 MED ORDER — FUROSEMIDE 10 MG/ML IJ SOLN
40.0000 mg | Freq: Once | INTRAMUSCULAR | Status: AC
  Administered 2016-01-15: 40 mg via INTRAVENOUS
  Filled 2016-01-15: qty 4

## 2016-01-15 MED ORDER — ALBUTEROL SULFATE (2.5 MG/3ML) 0.083% IN NEBU
2.5000 mg | INHALATION_SOLUTION | RESPIRATORY_TRACT | Status: DC | PRN
  Administered 2016-01-23: 2.5 mg via RESPIRATORY_TRACT
  Filled 2016-01-15: qty 3
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
## 2016-01-15 NOTE — Progress Notes (Signed)
ANTIBIOTIC CONSULT NOTE  Pharmacy Consult for Vanco/Primaxin Indication: Bacteremia, PNA  Allergies  Allergen Reactions  . Latex Hives  . Penicillins     Has patient had a PCN reaction causing immediate rash, facial/tongue/throat swelling, SOB or lightheadedness with hypotension: Yes Has patient had a PCN reaction causing severe rash involving mucus membranes or skin necrosis: UNKNOWN (ACTIVE ALLERGY, per sister) Has patient had a PCN reaction that required hospitalization: UNKNOWN (ACTIVE ALLERGY, per sister) Has patient had a PCN reaction occurring within the last 10 years: UNKNOWN (ACTIVE ALLERGY, per sister) If all of the above answers are "NO",     Patient Measurements: Height: 5\' 7"  (170.2 cm) Weight: 185 lb 6.5 oz (84.1 kg) IBW/kg (Calculated) : 61.6 Adjusted Body Weight:    Vital Signs: Temp: 100.4 F (38 C) (05/29 1200) Temp Source: Core (Comment) (05/29 0800) BP: 125/59 mmHg (05/29 1200) Pulse Rate: 102 (05/29 1200) Intake/Output from previous day: 05/28 0701 - 05/29 0700 In: 2533.3 [I.V.:558.3; Blood:335; NG/GT:1440; IV Piggyback:200] Out: 2525 [Urine:2225; Stool:300] Intake/Output from this shift: Total I/O In: 350 [IV Piggyback:350] Out: 2400 [Urine:2400]  Labs:  Recent Labs  01/13/16 0245 01/14/16 0448 01/15/16 0428  WBC 9.5 8.2 9.7  HGB 8.2* 6.8* 7.9*  PLT 238 246 300  CREATININE 0.63 0.57 0.46   Estimated Creatinine Clearance: 100 mL/min (by C-G formula based on Cr of 0.46).  Recent Labs  01/13/16 0845 01/15/16 1147  VANCOTROUGH 11 14     Medical History: No past medical history on file.  Assessment:  ID: Tmax 101.7, WBC wnl. Abx for PNA and bacteremia. Trough came back only slightly below goal so will leave dose the same.  5/24 TA >> Enterobacter aerogenes R Ancef 5/25 BCx >> CNS x 2 5/20 MRSA PCR negative  Primaxin 5/25>> Vanco 5/25>> 5/27 VT=11 529 VT = 14   Goal of Therapy:  Vancomycin trough level 15-20 mcg/ml  Plan:    --Con vancomycin 1250mg  IV q8hr --Imipenem 500 mg IV q6h --Consider de-escalating primaxin to levaquin  Ulyses SouthwardMinh Pham, PharmD Pager: [phone] 01/15/2016 1:04 PM
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
## 2016-01-16 ENCOUNTER — Inpatient Hospital Stay (HOSPITAL_COMMUNITY): Payer: Medicaid Other

## 2016-01-16 LAB — BASIC METABOLIC PANEL
ANION GAP: 7 (ref 5–15)
BUN: 18 mg/dL (ref 6–20)
CALCIUM: 8.4 mg/dL — AB (ref 8.9–10.3)
CO2: 31 mmol/L (ref 22–32)
Chloride: 104 mmol/L (ref 101–111)
Creatinine, Ser: 0.43 mg/dL — ABNORMAL LOW (ref 0.44–1.00)
GLUCOSE: 180 mg/dL — AB (ref 65–99)
POTASSIUM: 4.5 mmol/L (ref 3.5–5.1)
Sodium: 142 mmol/L (ref 135–145)

## 2016-01-16 LAB — GLUCOSE, CAPILLARY
GLUCOSE-CAPILLARY: 140 mg/dL — AB (ref 65–99)
GLUCOSE-CAPILLARY: 176 mg/dL — AB (ref 65–99)
Glucose-Capillary: 144 mg/dL — ABNORMAL HIGH (ref 65–99)
Glucose-Capillary: 157 mg/dL — ABNORMAL HIGH (ref 65–99)
Glucose-Capillary: 178 mg/dL — ABNORMAL HIGH (ref 65–99)
Glucose-Capillary: 146 mg/dL — ABNORMAL HIGH (ref 65–99)

## 2016-01-16 LAB — CBC
HCT: 28.4 % — ABNORMAL LOW (ref 36.0–46.0)
Hemoglobin: 8.8 g/dL — ABNORMAL LOW (ref 12.0–15.0)
MCH: 30.8 pg (ref 26.0–34.0)
MCHC: 31 g/dL (ref 30.0–36.0)
MCV: 99.3 fL (ref 78.0–100.0)
PLATELETS: 359 10*3/uL (ref 150–400)
RBC: 2.86 MIL/uL — ABNORMAL LOW (ref 3.87–5.11)
RDW: 16.5 % — AB (ref 11.5–15.5)
WBC: 10.7 10*3/uL — ABNORMAL HIGH (ref 4.0–10.5)

## 2016-01-16 MED ORDER — QUETIAPINE FUMARATE 25 MG PO TABS
50.0000 mg | ORAL_TABLET | Freq: Two times a day (BID) | ORAL | Status: DC
  Administered 2016-01-16 – 2016-01-17 (×4): 50 mg
  Filled 2016-01-16 (×4): qty 2

## 2016-01-16 MED ORDER — METOPROLOL TARTRATE 5 MG/5ML IV SOLN
5.0000 mg | INTRAVENOUS | Status: DC
  Administered 2016-01-16 – 2016-01-17 (×7): 5 mg via INTRAVENOUS
  Filled 2016-01-16 (×7): qty 5

## 2016-01-16 MED ORDER — LEVOFLOXACIN IN D5W 750 MG/150ML IV SOLN
750.0000 mg | INTRAVENOUS | Status: AC
  Administered 2016-01-16 – 2016-01-26 (×11): 750 mg via INTRAVENOUS
  Filled 2016-01-16 (×12): qty 150

## 2016-01-16 MED ORDER — ARTIFICIAL TEARS OP OINT
TOPICAL_OINTMENT | OPHTHALMIC | Status: DC | PRN
  Administered 2016-01-16 – 2016-01-22 (×3): 1 via OPHTHALMIC
  Filled 2016-01-16 (×2): qty 3.5

## 2016-01-16 MED ORDER — SODIUM CHLORIDE 0.9 % IV SOLN
0.0000 mg/h | INTRAVENOUS | Status: DC
  Administered 2016-01-16: 0.5 mg/h via INTRAVENOUS
  Administered 2016-01-17 – 2016-01-20 (×2): 2 mg/h via INTRAVENOUS
  Administered 2016-01-21: 1.5 mg/h via INTRAVENOUS
  Filled 2016-01-16 (×4): qty 10

## 2016-01-16 MED ORDER — CLONAZEPAM 0.5 MG PO TABS
0.5000 mg | ORAL_TABLET | Freq: Two times a day (BID) | ORAL | Status: DC
  Administered 2016-01-16 – 2016-01-17 (×4): 0.5 mg
  Filled 2016-01-16 (×4): qty 1

## 2016-01-16 MED ORDER — FUROSEMIDE 10 MG/ML IJ SOLN
40.0000 mg | Freq: Once | INTRAMUSCULAR | Status: AC
  Administered 2016-01-16: 40 mg via INTRAVENOUS
  Filled 2016-01-16: qty 4

## 2016-01-16 NOTE — Progress Notes (Signed)
Patient placed back on full vent support due to increased HR.  RN aware.

## 2016-01-16 NOTE — Progress Notes (Signed)
[place] [facility] [place] excusing [place] from court date on June 6.  Letter signed by MD, and faxed to Gloriann LoanWanda [facility]; fax # [phone].  Notified pt's mother, Mrs. Clinton SawyerWilliamson that letter was sent, and original is in patient's chart.  Mother appreciative of help.  Pt remains sedated and on ventilator at full support currently.  Will follow progress as pt progresses.    Quintella BatonJulie W. Takari Duncombe, RN, BSN  Trauma/Neuro ICU Case Manager [phone]
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
## 2016-01-16 NOTE — Progress Notes (Signed)
Patient ID: [name], female   DOB: 01/24/1971, 45 y.o.   MRN: [id number] Sedated.intubated. Ct head from 48 hours ago showed brain  Tissue almost back to normal.

## 2016-01-17 ENCOUNTER — Inpatient Hospital Stay (HOSPITAL_COMMUNITY): Payer: Medicaid Other

## 2016-01-17 LAB — GLUCOSE, CAPILLARY
GLUCOSE-CAPILLARY: 128 mg/dL — AB (ref 65–99)
GLUCOSE-CAPILLARY: 147 mg/dL — AB (ref 65–99)
Glucose-Capillary: 148 mg/dL — ABNORMAL HIGH (ref 65–99)
Glucose-Capillary: 122 mg/dL — ABNORMAL HIGH (ref 65–99)
Glucose-Capillary: 148 mg/dL — ABNORMAL HIGH (ref 65–99)
Glucose-Capillary: 115 mg/dL — ABNORMAL HIGH (ref 65–99)

## 2016-01-17 LAB — CBC
HCT: 27.5 % — ABNORMAL LOW (ref 36.0–46.0)
Hemoglobin: 8.2 g/dL — ABNORMAL LOW (ref 12.0–15.0)
MCH: 29.2 pg (ref 26.0–34.0)
MCHC: 29.8 g/dL — ABNORMAL LOW (ref 30.0–36.0)
MCV: 97.9 fL (ref 78.0–100.0)
PLATELETS: 344 10*3/uL (ref 150–400)
RBC: 2.81 MIL/uL — AB (ref 3.87–5.11)
RDW: 16.3 % — ABNORMAL HIGH (ref 11.5–15.5)
WBC: 8.5 10*3/uL (ref 4.0–10.5)

## 2016-01-17 LAB — POCT I-STAT 3, ART BLOOD GAS (G3+)
ACID-BASE EXCESS: 6 mmol/L — AB (ref 0.0–2.0)
BICARBONATE: 31.9 meq/L — AB (ref 20.0–24.0)
O2 Saturation: 97 %
TCO2: 33 mmol/L (ref 0–100)
pCO2 arterial: 51.5 mmHg — ABNORMAL HIGH (ref 35.0–45.0)
pH, Arterial: 7.404 (ref 7.350–7.450)
pO2, Arterial: 98 mmHg (ref 80.0–100.0)

## 2016-01-17 LAB — BASIC METABOLIC PANEL
ANION GAP: 3 — AB (ref 5–15)
BUN: 21 mg/dL — ABNORMAL HIGH (ref 6–20)
CO2: 33 mmol/L — ABNORMAL HIGH (ref 22–32)
Calcium: 8.3 mg/dL — ABNORMAL LOW (ref 8.9–10.3)
Chloride: 105 mmol/L (ref 101–111)
Creatinine, Ser: 0.47 mg/dL (ref 0.44–1.00)
Glucose, Bld: 147 mg/dL — ABNORMAL HIGH (ref 65–99)
POTASSIUM: 4.3 mmol/L (ref 3.5–5.1)
SODIUM: 141 mmol/L (ref 135–145)

## 2016-01-17 MED ORDER — PANTOPRAZOLE SODIUM 40 MG PO PACK
40.0000 mg | PACK | Freq: Every day | ORAL | Status: DC
  Administered 2016-01-17 – 2016-02-05 (×20): 40 mg
  Filled 2016-01-17 (×19): qty 20

## 2016-01-17 MED ORDER — MIDAZOLAM HCL 2 MG/2ML IJ SOLN
2.0000 mg | INTRAMUSCULAR | Status: DC | PRN
  Administered 2016-01-23: 2 mg via INTRAVENOUS
  Filled 2016-01-17 (×5): qty 2

## 2016-01-17 MED ORDER — DEXMEDETOMIDINE HCL IN NACL 400 MCG/100ML IV SOLN
0.0000 ug/kg/h | INTRAVENOUS | Status: DC
  Administered 2016-01-17: 0.2 ug/kg/h via INTRAVENOUS
  Administered 2016-01-17: 0.4 ug/kg/h via INTRAVENOUS
  Administered 2016-01-18: 0.7 ug/kg/h via INTRAVENOUS
  Administered 2016-01-18 (×2): 0.8 ug/kg/h via INTRAVENOUS
  Administered 2016-01-18: 0.7 ug/kg/h via INTRAVENOUS
  Administered 2016-01-19: 0.8 ug/kg/h via INTRAVENOUS
  Administered 2016-01-19: 0.4 ug/kg/h via INTRAVENOUS
  Filled 2016-01-17: qty 100
  Filled 2016-01-17: qty 50
  Filled 2016-01-17 (×7): qty 100

## 2016-01-17 MED ORDER — MIDAZOLAM HCL 2 MG/2ML IJ SOLN
2.0000 mg | INTRAMUSCULAR | Status: DC | PRN
  Administered 2016-01-17 – 2016-01-20 (×7): 2 mg via INTRAVENOUS
  Filled 2016-01-17 (×5): qty 2

## 2016-01-17 NOTE — Progress Notes (Signed)
Patient ID: [name], female   DOB: 04/30/1971, 45 y.o.   MRN: [id number] Sedated, no neuro changes. Repeat ct head this weekend
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
## 2016-01-17 NOTE — Progress Notes (Signed)
Follow up - Trauma and Critical Care  Patient Details:    [name] is an 45 y.o. female.  Lines/tubes : Airway 7.5 mm (Active)  Secured at (cm) 22 cm 01/17/2016  7:17 AM  Measured From Lips 01/17/2016  7:17 AM  Secured Location Left 01/17/2016  7:17 AM  Secured By [employer] T[employer] 01/17/2016  7:17 AM  Tube Holder Repositioned Yes 01/17/2016  7:17 AM  Cuff Pressure (cm H2O) 24 cm H2O 01/16/2016  4:26 PM  Site Condition Dry 01/17/2016  7:17 AM     CVC Triple Lumen 01/11/16 Left Subclavian (Active)  Indication for Insertion or Continuance of Line Prolonged intravenous therapies 01/17/2016  8:00 AM  Site Assessment Clean;Dry;Intact 01/17/2016  8:00 AM  Proximal Lumen Status Infusing 01/17/2016  8:00 AM  Medial Infusing 01/17/2016  8:00 AM  Distal Lumen Status Infusing 01/17/2016  8:00 AM  Dressing Type Transparent 01/17/2016  8:00 AM  Dressing Status Clean;Dry;Intact;Antimicrobial disc in place 01/17/2016  8:00 AM  Line Care Connections checked and tightened;Zeroed and calibrated;Leveled 01/17/2016  8:00 AM  Dressing Intervention Dressing changed;Antimicrobial disc changed 01/17/2016  4:00 AM  Dressing Change Due 01/24/16 01/17/2016  8:00 AM     Chest Tube 1 Left;Lateral Pleural (Active)  Suction To water seal 01/17/2016  8:00 AM  Chest Tube Air Leak None 01/17/2016  8:00 AM  Patency Intervention Tip/tilt 01/12/2016  8:00 AM  Drainage Description Other (Comment) 01/17/2016  8:00 AM  Dressing Status Clean;Dry;Intact 01/17/2016  8:00 AM  Dressing Intervention Dressing changed 01/16/2016  2:00 AM  Site Assessment Clean;Dry;Intact 01/16/2016  2:00 AM  Surrounding Skin Unable to view 01/16/2016  8:00 AM  Output (mL) 0 mL 01/17/2016  8:00 AM     NG/OG Tube Orogastric Right mouth (Active)  Placement Verification Auscultation 01/17/2016  8:00 AM  Site Assessment Clean;Dry;Intact 01/17/2016  8:00 AM  Status Irrigated;Infusing tube feed 01/17/2016  8:00 AM  Drainage Appearance Bile 01/14/2016  8:00 AM   Intake (mL) 30 mL 01/17/2016  8:00 AM  Output (mL) 0 mL 01/14/2016  8:00 PM     Rectal Tube/Pouch (Active)  Output (mL) 0 mL 01/17/2016  8:00 AM     Urethral Catheter [name], EMT Non-latex;Temperature probe 14 Fr. (Active)  Indication for Insertion or Continuance of Catheter Unstable critical patients (first 24-48 hours) 01/16/2016  7:30 PM  Site Assessment Clean;Intact;Dry 01/17/2016  8:00 AM  Catheter Maintenance Bag below level of bladder;Catheter secured;Drainage bag/tubing not touching floor;Insertion date on drainage bag;No dependent loops;Seal intact 01/17/2016  8:00 AM  Collection Container Standard drainage bag 01/17/2016  8:00 AM  Securement Method Leg strap 01/17/2016  8:00 AM  Urinary Catheter Interventions Unclamped 01/17/2016  8:00 AM  Input (mL) 30 mL 01/13/2016  4:00 AM  Output (mL) 125 mL 01/17/2016  8:00 AM    Microbiology/Sepsis markers: Results for orders placed or performed during the hospital encounter of 01/06/16  MRSA PCR Screening     Status: None   Collection Time: 01/06/16  1:45 PM  Result Value Ref Range Status   MRSA by PCR NEGATIVE NEGATIVE Final    Comment:        The GeneXpert MRSA Assay (FDA approved for NASAL specimens only), is one component of a comprehensive MRSA colonization surveillance program. It is not intended to diagnose MRSA infection nor to guide or monitor treatment for MRSA infections.   Culture, respiratory (NON-Expectorated)     Status: None   Collection Time: 01/10/16  7:59 PM  Result Value  Ref Range Status   Specimen Description TRACHEAL ASPIRATE  Final   Special Requests NONE  Final   Gram Stain   Final    MODERATE WBC PRESENT,BOTH PMN AND MONONUCLEAR NO ORGANISMS SEEN    Culture MODERATE ENTEROBACTER AEROGENES  Final   Report Status 01/13/2016 FINAL  Final   Organism ID, Bacteria ENTEROBACTER AEROGENES  Final      Susceptibility   Enterobacter aerogenes - MIC*    CEFAZOLIN >=64 RESISTANT Resistant     CEFEPIME <=1  SENSITIVE Sensitive     CEFTAZIDIME <=1 SENSITIVE Sensitive     CEFTRIAXONE <=1 SENSITIVE Sensitive     CIPROFLOXACIN <=0.25 SENSITIVE Sensitive     GENTAMICIN <=1 SENSITIVE Sensitive     IMIPENEM 1 SENSITIVE Sensitive     TRIMETH/SULFA <=20 SENSITIVE Sensitive     PIP/TAZO 8 SENSITIVE Sensitive     * MODERATE ENTEROBACTER AEROGENES  Culture, blood (routine x 2)     Status: Abnormal   Collection Time: 01/11/16  9:57 AM  Result Value Ref Range Status   Specimen Description BLOOD RIGHT ARM  Final   Special Requests IN PEDIATRIC BOTTLE 3CC  Final   Culture  Setup Time   Final    GRAM POSITIVE COCCI IN CLUSTERS AEROBIC BOTTLE ONLY CRITICAL RESULT CALLED TO, READ BACK BY AND VERIFIED WITH: ANDREW MEYER,PHARMD @0744  01/12/16 MKELLY    Culture (A)  Final    STAPHYLOCOCCUS SPECIES (COAGULASE NEGATIVE) SUSCEPTIBILITIES PERFORMED ON PREVIOUS CULTURE WITHIN THE LAST 5 DAYS.    Report Status 01/15/2016 FINAL  Final  Blood Culture ID Panel (Reflexed)     Status: Abnormal   Collection Time: 01/11/16  9:57 AM  Result Value Ref Range Status   Enterococcus species NOT DETECTED NOT DETECTED Final   Vancomycin resistance NOT DETECTED NOT DETECTED Final   Listeria monocytogenes NOT DETECTED NOT DETECTED Final   Staphylococcus species DETECTED (A) NOT DETECTED Final    Comment: CRITICAL RESULT CALLED TO, READ BACK BY AND VERIFIED WITH: ANDREW MEYER,PHARMD @0744  01/12/16 MKELLY    Staphylococcus aureus NOT DETECTED NOT DETECTED Final   Methicillin resistance DETECTED (A) NOT DETECTED Final    Comment: CRITICAL RESULT CALLED TO, READ BACK BY AND VERIFIED WITH: ANDREW MEYER,PHARMD @0744  01/12/16 MKELLY    Streptococcus species NOT DETECTED NOT DETECTED Final   Streptococcus agalactiae NOT DETECTED NOT DETECTED Final   Streptococcus pneumoniae NOT DETECTED NOT DETECTED Final   Streptococcus pyogenes NOT DETECTED NOT DETECTED Final   Acinetobacter baumannii NOT DETECTED NOT DETECTED Final    Enterobacteriaceae species NOT DETECTED NOT DETECTED Final   Enterobacter cloacae complex NOT DETECTED NOT DETECTED Final   Escherichia coli NOT DETECTED NOT DETECTED Final   Klebsiella oxytoca NOT DETECTED NOT DETECTED Final   Klebsiella pneumoniae NOT DETECTED NOT DETECTED Final   Proteus species NOT DETECTED NOT DETECTED Final   Serratia marcescens NOT DETECTED NOT DETECTED Final   Carbapenem resistance NOT DETECTED NOT DETECTED Final   Haemophilus influenzae NOT DETECTED NOT DETECTED Final   Neisseria meningitidis NOT DETECTED NOT DETECTED Final   Pseudomonas aeruginosa NOT DETECTED NOT DETECTED Final   Candida albicans NOT DETECTED NOT DETECTED Final   Candida glabrata NOT DETECTED NOT DETECTED Final   Candida krusei NOT DETECTED NOT DETECTED Final   Candida parapsilosis NOT DETECTED NOT DETECTED Final   Candida tropicalis NOT DETECTED NOT DETECTED Final  Culture, blood (routine x 2)     Status: Abnormal   Collection Time: 01/11/16 10:00 AM  Result  Value Ref Range Status   Specimen Description BLOOD RIGHT HAND  Final   Special Requests IN PEDIATRIC BOTTLE 1.5CC  Final   Culture  Setup Time   Final    GRAM POSITIVE COCCI IN CLUSTERS AEROBIC BOTTLE ONLY CRITICAL RESULT CALLED TO, READ BACK BY AND VERIFIED WITH: ANDREW MEYER,PHARMD @0744  01/12/16 MKELLY    Culture STAPHYLOCOCCUS SPECIES (COAGULASE NEGATIVE) (A)  Final   Report Status 01/15/2016 FINAL  Final   Organism ID, Bacteria STAPHYLOCOCCUS SPECIES (COAGULASE NEGATIVE)  Final      Susceptibility   Staphylococcus species (coagulase negative) - MIC*    CIPROFLOXACIN <=0.5 SENSITIVE Sensitive     ERYTHROMYCIN >=8 RESISTANT Resistant     GENTAMICIN <=0.5 SENSITIVE Sensitive     OXACILLIN >=4 RESISTANT Resistant     TETRACYCLINE >=16 RESISTANT Resistant     VANCOMYCIN 2 SENSITIVE Sensitive     TRIMETH/SULFA <=10 SENSITIVE Sensitive     CLINDAMYCIN <=0.25 RESISTANT Resistant     RIFAMPIN <=0.5 SENSITIVE Sensitive      Inducible Clindamycin POSITIVE Resistant     * STAPHYLOCOCCUS SPECIES (COAGULASE NEGATIVE)  Culture, blood (routine x 2)     Status: None (Preliminary result)   Collection Time: 01/13/16  2:05 AM  Result Value Ref Range Status   Specimen Description BLOOD BLOOD RIGHT HAND  Final   Special Requests IN PEDIATRIC BOTTLE  1CC  Final   Culture NO GROWTH 3 DAYS  Final   Report Status PENDING  Incomplete  Culture, blood (routine x 2)     Status: None (Preliminary result)   Collection Time: 01/13/16  2:10 AM  Result Value Ref Range Status   Specimen Description BLOOD BLOOD RIGHT HAND  Final   Special Requests IN PEDIATRIC BOTTLE  2CC  Final   Culture NO GROWTH 3 DAYS  Final   Report Status PENDING  Incomplete    Anti-infectives:  Anti-infectives    Start     Dose/Rate Route Frequency Ordered Stop   01/16/16 1600  levofloxacin (LEVAQUIN) IVPB 750 mg     750 mg 100 mL/hr over 90 Minutes Intravenous Every 24 hours 01/16/16 0953     01/13/16 1200  vancomycin (VANCOCIN) 1,250 mg in sodium chloride 0.9 % 250 mL IVPB  Status:  Discontinued     1,250 mg 166.7 mL/hr over 90 Minutes Intravenous Every 8 hours 01/13/16 1109 01/16/16 0953   01/11/16 1800  vancomycin (VANCOCIN) IVPB 1000 mg/200 mL premix  Status:  Discontinued     1,000 mg 200 mL/hr over 60 Minutes Intravenous Every 8 hours 01/11/16 0932 01/13/16 1109   01/11/16 1600  imipenem-cilastatin (PRIMAXIN) 500 mg in sodium chloride 0.9 % 100 mL IVPB  Status:  Discontinued     500 mg 200 mL/hr over 30 Minutes Intravenous Every 6 hours 01/11/16 0941 01/16/16 0953   01/11/16 1000  vancomycin (VANCOCIN) 1,500 mg in sodium chloride 0.9 % 500 mL IVPB     1,500 mg 250 mL/hr over 120 Minutes Intravenous  Once 01/11/16 0932 01/11/16 1247   01/11/16 1000  imipenem-cilastatin (PRIMAXIN) 250 mg in sodium chloride 0.9 % 100 mL IVPB     250 mg 200 mL/hr over 30 Minutes Intravenous  Once 01/11/16 0941 01/11/16 1052      Best Practice/Protocols:  VTE  Prophylaxis: Mechanical GI Prophylaxis: Proton Pump Inhibitor Continous Sedation Versed and fentanyl.  Will start Precedex today  Consults: Treatment Team:  Trauma Md, MD Sheral Apley, MD Hilda Lias, MD    Events:  Subjective:    Overnight Issues: Sedation.  Not weaning well yet  Objective:  Vital signs for last 24 hours: Temp:  [97.9 F (36.6 C)-100 F (37.8 C)] 100 F (37.8 C) (05/31 0800) Pulse Rate:  [87-141] 91 (05/31 0800) Resp:  [15-35] 18 (05/31 0800) BP: (98-221)/(46-117) 122/78 mmHg (05/31 0800) SpO2:  [97 %-100 %] 98 % (05/31 0800) FiO2 (%):  [40 %] 40 % (05/31 0800) Weight:  [82 kg (180 lb 12.4 oz)] 82 kg (180 lb 12.4 oz) (05/31 0300)  Hemodynamic parameters for last 24 hours: CVP:  [2 mmHg-19 mmHg] 19 mmHg  Intake/Output from previous day: 05/30 0701 - 05/31 0700 In: 2916.8 [I.V.:956.8; NG/GT:1710; IV Piggyback:250] Out: 4210 [Urine:4035; Stool:175]  Intake/Output this shift: Total I/O In: 120 [I.V.:30; NG/GT:90] Out: 125 [Urine:125]  Vent settings for last 24 hours: Vent Mode:  [-] PRVC FiO2 (%):  [40 %] 40 % Set Rate:  [28 bmp] 28 bmp Vt Set:  [370 mL] 370 mL PEEP:  [5 cmH20] 5 cmH20 Plateau Pressure:  [11 cmH20-20 cmH20] 11 cmH20  Physical Exam:  General: no respiratory distress and Sedated  heavily Neuro: nonfocal exam, RASS -1 and RASS -2 Resp: clear to auscultation bilaterally and chest tube in place, no airleak.  CXR better.  Has Enterobacter pneumonia which is being treated. CVS: regular rate and rhythm, S1, S2 normal, no murmur, click, rub or gallop GI: soft, nontender, BS WNL, no r/g and tolerating tube feedings, diarrhea associated with tube feedings. Extremities: edema 2+ and good pulses  Results for orders placed or performed during the hospital encounter of 01/06/16 (from the past 24 hour(s))  Glucose, capillary     Status: Abnormal   Collection Time: 01/16/16 11:19 AM  Result Value Ref Range   Glucose-Capillary 178  (H) 65 - 99 mg/dL   Comment 1 Notify RN   Glucose, capillary     Status: Abnormal   Collection Time: 01/16/16  3:17 PM  Result Value Ref Range   Glucose-Capillary 176 (H) 65 - 99 mg/dL   Comment 1 Notify RN   Glucose, capillary     Status: Abnormal   Collection Time: 01/16/16  8:55 PM  Result Value Ref Range   Glucose-Capillary 146 (H) 65 - 99 mg/dL  Glucose, capillary     Status: Abnormal   Collection Time: 01/17/16 12:04 AM  Result Value Ref Range   Glucose-Capillary 128 (H) 65 - 99 mg/dL   Comment 1 Capillary Specimen   CBC     Status: Abnormal   Collection Time: 01/17/16  3:30 AM  Result Value Ref Range   WBC 8.5 4.0 - 10.5 K/uL   RBC 2.81 (L) 3.87 - 5.11 MIL/uL   Hemoglobin 8.2 (L) 12.0 - 15.0 g/dL   HCT 56.2 (L) 13.0 - 86.5 %   MCV 97.9 78.0 - 100.0 fL   MCH 29.2 26.0 - 34.0 pg   MCHC 29.8 (L) 30.0 - 36.0 g/dL   RDW 78.4 (H) 69.6 - 29.5 %   Platelets 344 150 - 400 K/uL  Basic metabolic panel     Status: Abnormal   Collection Time: 01/17/16  3:30 AM  Result Value Ref Range   Sodium 141 135 - 145 mmol/L   Potassium 4.3 3.5 - 5.1 mmol/L   Chloride 105 101 - 111 mmol/L   CO2 33 (H) 22 - 32 mmol/L   Glucose, Bld 147 (H) 65 - 99 mg/dL   BUN 21 (H) 6 - 20 mg/dL   Creatinine,  Ser 0.47 0.44 - 1.00 mg/dL   Calcium 8.3 (L) 8.9 - 10.3 mg/dL   GFR calc non Af Amer >60 >60 mL/min   GFR calc Af Amer >60 >60 mL/min   Anion gap 3 (L) 5 - 15  Glucose, capillary     Status: Abnormal   Collection Time: 01/17/16  3:30 AM  Result Value Ref Range   Glucose-Capillary 148 (H) 65 - 99 mg/dL  Glucose, capillary     Status: Abnormal   Collection Time: 01/17/16  7:26 AM  Result Value Ref Range   Glucose-Capillary 147 (H) 65 - 99 mg/dL   Comment 1 Capillary Specimen    Comment 2 Notify RN      Assessment/Plan:   NEURO  Altered Mental Status:  sedation   Plan: Wean sedation when we are moving more in the direction of extubation.  PULM  Atelectasis/collapse (focal and Right lung  pneumonia being treated.) Pneumothorax (traumatic) Che3st tube in place.  No air leak.   Plan: CPM  CARDIO  No significant issues.   Plan: CPM  RENAL  Urine output has been good.     Plan: No issues to address currently.  GI  No issues   Plan: Continue tube feedings.  ID  Pneumonia (hospital acquired (not ventilator-associated) Enterobacter pneumonia being treated.)   Plan: Continue Levaquin  HEME  Anemia acute blood loss anemia and anemia of critical illness)   Plan: No blood for now.  ENDO No specific issues   Plan: CPM  Global Issues  Patient converted from ARDsNet protocol to conventional ventilation.  Will check ABG in a couple of hours.  Will then move towards wean from the ventilator over the next couple of days    LOS: 11 days   Additional comments:I reviewed the patient's new clinical lab test results. cbc/bmet and I reviewed the patients new imaging test results. cxr  Critical Care Total Time*: 30 Minutes  Autie Vasudevan 01/17/2016  *Care during the described time interval was provided by me and/or other providers on the critical care team.  I have reviewed this patient's available data, including medical history, events of note, physical examination and test results as part of my evaluation.

## 2016-01-18 ENCOUNTER — Inpatient Hospital Stay (HOSPITAL_COMMUNITY): Payer: Medicaid Other

## 2016-01-18 LAB — GLUCOSE, CAPILLARY
GLUCOSE-CAPILLARY: 118 mg/dL — AB (ref 65–99)
GLUCOSE-CAPILLARY: 147 mg/dL — AB (ref 65–99)
Glucose-Capillary: 120 mg/dL — ABNORMAL HIGH (ref 65–99)
Glucose-Capillary: 124 mg/dL — ABNORMAL HIGH (ref 65–99)
Glucose-Capillary: 124 mg/dL — ABNORMAL HIGH (ref 65–99)
Glucose-Capillary: 143 mg/dL — ABNORMAL HIGH (ref 65–99)
Glucose-Capillary: 152 mg/dL — ABNORMAL HIGH (ref 65–99)

## 2016-01-18 LAB — CULTURE, BLOOD (ROUTINE X 2)
CULTURE: NO GROWTH
Culture: NO GROWTH

## 2016-01-18 MED ORDER — QUETIAPINE FUMARATE 100 MG PO TABS
100.0000 mg | ORAL_TABLET | Freq: Three times a day (TID) | ORAL | Status: DC
  Administered 2016-01-18 – 2016-01-21 (×10): 100 mg
  Filled 2016-01-18 (×10): qty 1

## 2016-01-18 MED ORDER — FREE WATER
200.0000 mL | Freq: Four times a day (QID) | Status: DC
  Administered 2016-01-18 – 2016-01-20 (×8): 200 mL

## 2016-01-18 MED ORDER — FUROSEMIDE 10 MG/ML IJ SOLN
40.0000 mg | Freq: Two times a day (BID) | INTRAMUSCULAR | Status: AC
  Administered 2016-01-18 – 2016-01-19 (×4): 40 mg via INTRAVENOUS
  Filled 2016-01-18 (×4): qty 4

## 2016-01-18 MED ORDER — CLONAZEPAM 1 MG PO TABS
1.0000 mg | ORAL_TABLET | Freq: Two times a day (BID) | ORAL | Status: DC
  Administered 2016-01-18 – 2016-01-21 (×7): 1 mg
  Filled 2016-01-18 (×7): qty 1

## 2016-01-18 MED ORDER — ENOXAPARIN SODIUM 40 MG/0.4ML ~~LOC~~ SOLN
40.0000 mg | SUBCUTANEOUS | Status: DC
  Administered 2016-01-18 – 2016-02-05 (×19): 40 mg via SUBCUTANEOUS
  Filled 2016-01-18 (×18): qty 0.4

## 2016-01-18 NOTE — Progress Notes (Signed)
Placed bite block in place to prevent patient from continuing to bite ET tube and tongue.  No complications during insertion.

## 2016-01-18 NOTE — Progress Notes (Signed)
Patient ID: [name], female   DOB: 04/10/1971, 45 y.o.   MRN: [id number] Neur unchanged. Agitated at stimuklation. Moves all 4

## 2016-01-18 NOTE — Progress Notes (Signed)
Follow up - Trauma and Critical Care  Patient Details:    [name] is an 45 y.o. female.  Lines/tubes : Airway 7.5 mm (Active)  Secured at (cm) 22 cm 01/18/2016  7:16 AM  Measured From Lips 01/18/2016  7:16 AM  Secured Location Left 01/18/2016  7:16 AM  Secured By [employer] 01/18/2016  7:16 AM  Tube Holder Repositioned Yes 01/18/2016  7:16 AM  Cuff Pressure (cm H2O) 24 cm H2O 01/17/2016 11:21 PM  Site Condition Dry 01/18/2016  7:16 AM     CVC Triple Lumen 01/11/16 Left Subclavian (Active)  Indication for Insertion or Continuance of Line Prolonged intravenous therapies 01/17/2016  8:00 PM  Site Assessment Clean;Dry;Intact 01/17/2016  8:00 PM  Proximal Lumen Status Infusing 01/17/2016  8:00 PM  Medial Infusing 01/17/2016  8:00 PM  Distal Lumen Status Infusing 01/17/2016  8:00 PM  Dressing Type Transparent 01/17/2016  8:00 PM  Dressing Status Clean;Dry;Intact;Antimicrobial disc in place 01/17/2016  8:00 PM  Line Care Connections checked and tightened;Zeroed and calibrated;Leveled 01/17/2016  8:00 PM  Dressing Intervention Dressing changed;Antimicrobial disc changed 01/17/2016  4:00 AM  Dressing Change Due 01/24/16 01/17/2016  8:00 PM     Chest Tube 1 Left;Lateral Pleural (Active)  Suction To water seal 01/17/2016  8:00 PM  Chest Tube Air Leak None 01/17/2016  8:00 PM  Patency Intervention Tip/tilt 01/12/2016  8:00 AM  Drainage Description Yellow 01/17/2016 10:00 AM  Dressing Status Clean;Dry;Intact 01/17/2016  8:00 PM  Dressing Intervention Dressing changed 01/17/2016 10:00 AM  Site Assessment Clean;Leaking 01/17/2016 10:00 AM  Surrounding Skin Other (Comment) 01/17/2016 10:00 AM  Output (mL) 0 mL 01/18/2016  6:00 AM     NG/OG Tube Orogastric Right mouth (Active)  Placement Verification Auscultation 01/17/2016  8:00 PM  Site Assessment Clean;Dry;Intact 01/17/2016  8:00 PM  Status Irrigated 01/17/2016  9:00 PM  Drainage Appearance Bile 01/14/2016  8:00 AM  Intake (mL) 30 mL 01/18/2016 12:00  AM  Output (mL) 75 mL 01/18/2016  4:00 AM     Rectal Tube/Pouch (Active)  Output (mL) 0 mL 01/18/2016  6:00 AM     Urethral Catheter [name], EMT Non-latex;Temperature probe 14 Fr. (Active)  Indication for Insertion or Continuance of Catheter Unstable spinal/crush injuries 01/17/2016  7:15 PM  Site Assessment Clean;Intact;Dry 01/17/2016  8:00 PM  Catheter Maintenance Bag below level of bladder;Catheter secured;Drainage bag/tubing not touching floor;Insertion date on drainage bag;No dependent loops;Seal intact 01/17/2016  7:15 PM  Collection Container Standard drainage bag 01/17/2016  8:00 PM  Securement Method Leg strap 01/17/2016  8:00 PM  Urinary Catheter Interventions Unclamped 01/17/2016  8:00 PM  Input (mL) 30 mL 01/13/2016  4:00 AM  Output (mL) 75 mL 01/18/2016  6:00 AM    Microbiology/Sepsis markers: Results for orders placed or performed during the hospital encounter of 01/06/16  MRSA PCR Screening     Status: None   Collection Time: 01/06/16  1:45 PM  Result Value Ref Range Status   MRSA by PCR NEGATIVE NEGATIVE Final    Comment:        The GeneXpert MRSA Assay (FDA approved for NASAL specimens only), is one component of a comprehensive MRSA colonization surveillance program. It is not intended to diagnose MRSA infection nor to guide or monitor treatment for MRSA infections.   Culture, respiratory (NON-Expectorated)     Status: None   Collection Time: 01/10/16  7:59 PM  Result Value Ref Range Status   Specimen Description TRACHEAL ASPIRATE  Final  Special Requests NONE  Final   Gram Stain   Final    MODERATE WBC PRESENT,BOTH PMN AND MONONUCLEAR NO ORGANISMS SEEN    Culture MODERATE ENTEROBACTER AEROGENES  Final   Report Status 01/13/2016 FINAL  Final   Organism ID, Bacteria ENTEROBACTER AEROGENES  Final      Susceptibility   Enterobacter aerogenes - MIC*    CEFAZOLIN >=64 RESISTANT Resistant     CEFEPIME <=1 SENSITIVE Sensitive     CEFTAZIDIME <=1 SENSITIVE Sensitive      CEFTRIAXONE <=1 SENSITIVE Sensitive     CIPROFLOXACIN <=0.25 SENSITIVE Sensitive     GENTAMICIN <=1 SENSITIVE Sensitive     IMIPENEM 1 SENSITIVE Sensitive     TRIMETH/SULFA <=20 SENSITIVE Sensitive     PIP/TAZO 8 SENSITIVE Sensitive     * MODERATE ENTEROBACTER AEROGENES  Culture, blood (routine x 2)     Status: Abnormal   Collection Time: 01/11/16  9:57 AM  Result Value Ref Range Status   Specimen Description BLOOD RIGHT ARM  Final   Special Requests IN PEDIATRIC BOTTLE 3CC  Final   Culture  Setup Time   Final    GRAM POSITIVE COCCI IN CLUSTERS AEROBIC BOTTLE ONLY CRITICAL RESULT CALLED TO, READ BACK BY AND VERIFIED WITH: ANDREW MEYER,PHARMD @0744  01/12/16 MKELLY    Culture (A)  Final    STAPHYLOCOCCUS SPECIES (COAGULASE NEGATIVE) SUSCEPTIBILITIES PERFORMED ON PREVIOUS CULTURE WITHIN THE LAST 5 DAYS.    Report Status 01/15/2016 FINAL  Final  Blood Culture ID Panel (Reflexed)     Status: Abnormal   Collection Time: 01/11/16  9:57 AM  Result Value Ref Range Status   Enterococcus species NOT DETECTED NOT DETECTED Final   Vancomycin resistance NOT DETECTED NOT DETECTED Final   Listeria monocytogenes NOT DETECTED NOT DETECTED Final   Staphylococcus species DETECTED (A) NOT DETECTED Final    Comment: CRITICAL RESULT CALLED TO, READ BACK BY AND VERIFIED WITH: ANDREW MEYER,PHARMD @0744  01/12/16 MKELLY    Staphylococcus aureus NOT DETECTED NOT DETECTED Final   Methicillin resistance DETECTED (A) NOT DETECTED Final    Comment: CRITICAL RESULT CALLED TO, READ BACK BY AND VERIFIED WITH: ANDREW MEYER,PHARMD @0744  01/12/16 MKELLY    Streptococcus species NOT DETECTED NOT DETECTED Final   Streptococcus agalactiae NOT DETECTED NOT DETECTED Final   Streptococcus pneumoniae NOT DETECTED NOT DETECTED Final   Streptococcus pyogenes NOT DETECTED NOT DETECTED Final   Acinetobacter baumannii NOT DETECTED NOT DETECTED Final   Enterobacteriaceae species NOT DETECTED NOT DETECTED Final    Enterobacter cloacae complex NOT DETECTED NOT DETECTED Final   Escherichia coli NOT DETECTED NOT DETECTED Final   Klebsiella oxytoca NOT DETECTED NOT DETECTED Final   Klebsiella pneumoniae NOT DETECTED NOT DETECTED Final   Proteus species NOT DETECTED NOT DETECTED Final   Serratia marcescens NOT DETECTED NOT DETECTED Final   Carbapenem resistance NOT DETECTED NOT DETECTED Final   Haemophilus influenzae NOT DETECTED NOT DETECTED Final   Neisseria meningitidis NOT DETECTED NOT DETECTED Final   Pseudomonas aeruginosa NOT DETECTED NOT DETECTED Final   Candida albicans NOT DETECTED NOT DETECTED Final   Candida glabrata NOT DETECTED NOT DETECTED Final   Candida krusei NOT DETECTED NOT DETECTED Final   Candida parapsilosis NOT DETECTED NOT DETECTED Final   Candida tropicalis NOT DETECTED NOT DETECTED Final  Culture, blood (routine x 2)     Status: Abnormal   Collection Time: 01/11/16 10:00 AM  Result Value Ref Range Status   Specimen Description BLOOD RIGHT HAND  Final  Special Requests IN PEDIATRIC BOTTLE 1.5CC  Final   Culture  Setup Time   Final    GRAM POSITIVE COCCI IN CLUSTERS AEROBIC BOTTLE ONLY CRITICAL RESULT CALLED TO, READ BACK BY AND VERIFIED WITH: ANDREW MEYER,PHARMD @0744  01/12/16 MKELLY    Culture STAPHYLOCOCCUS SPECIES (COAGULASE NEGATIVE) (A)  Final   Report Status 01/15/2016 FINAL  Final   Organism ID, Bacteria STAPHYLOCOCCUS SPECIES (COAGULASE NEGATIVE)  Final      Susceptibility   Staphylococcus species (coagulase negative) - MIC*    CIPROFLOXACIN <=0.5 SENSITIVE Sensitive     ERYTHROMYCIN >=8 RESISTANT Resistant     GENTAMICIN <=0.5 SENSITIVE Sensitive     OXACILLIN >=4 RESISTANT Resistant     TETRACYCLINE >=16 RESISTANT Resistant     VANCOMYCIN 2 SENSITIVE Sensitive     TRIMETH/SULFA <=10 SENSITIVE Sensitive     CLINDAMYCIN <=0.25 RESISTANT Resistant     RIFAMPIN <=0.5 SENSITIVE Sensitive     Inducible Clindamycin POSITIVE Resistant     * STAPHYLOCOCCUS SPECIES  (COAGULASE NEGATIVE)  Culture, blood (routine x 2)     Status: None (Preliminary result)   Collection Time: 01/13/16  2:05 AM  Result Value Ref Range Status   Specimen Description BLOOD BLOOD RIGHT HAND  Final   Special Requests IN PEDIATRIC BOTTLE  1CC  Final   Culture NO GROWTH 4 DAYS  Final   Report Status PENDING  Incomplete  Culture, blood (routine x 2)     Status: None (Preliminary result)   Collection Time: 01/13/16  2:10 AM  Result Value Ref Range Status   Specimen Description BLOOD BLOOD RIGHT HAND  Final   Special Requests IN PEDIATRIC BOTTLE  2CC  Final   Culture NO GROWTH 4 DAYS  Final   Report Status PENDING  Incomplete    Anti-infectives:  Anti-infectives    Start     Dose/Rate Route Frequency Ordered Stop   01/16/16 1600  levofloxacin (LEVAQUIN) IVPB 750 mg     750 mg 100 mL/hr over 90 Minutes Intravenous Every 24 hours 01/16/16 0953     01/13/16 1200  vancomycin (VANCOCIN) 1,250 mg in sodium chloride 0.9 % 250 mL IVPB  Status:  Discontinued     1,250 mg 166.7 mL/hr over 90 Minutes Intravenous Every 8 hours 01/13/16 1109 01/16/16 0953   01/11/16 1800  vancomycin (VANCOCIN) IVPB 1000 mg/200 mL premix  Status:  Discontinued     1,000 mg 200 mL/hr over 60 Minutes Intravenous Every 8 hours 01/11/16 0932 01/13/16 1109   01/11/16 1600  imipenem-cilastatin (PRIMAXIN) 500 mg in sodium chloride 0.9 % 100 mL IVPB  Status:  Discontinued     500 mg 200 mL/hr over 30 Minutes Intravenous Every 6 hours 01/11/16 0941 01/16/16 0953   01/11/16 1000  vancomycin (VANCOCIN) 1,500 mg in sodium chloride 0.9 % 500 mL IVPB     1,500 mg 250 mL/hr over 120 Minutes Intravenous  Once 01/11/16 0932 01/11/16 1247   01/11/16 1000  imipenem-cilastatin (PRIMAXIN) 250 mg in sodium chloride 0.9 % 100 mL IVPB     250 mg 200 mL/hr over 30 Minutes Intravenous  Once 01/11/16 0941 01/11/16 1052      Best Practice/Protocols:  VTE Prophylaxis: Mechanical GI Prophylaxis: Proton Pump  Inhibitor Continous Sedation  Consults: Treatment Team:  Trauma Md, MD Sheral Apley, MD Hilda Lias, MD    Events:  Subjective:    Overnight Issues: Patient back on conventional ventilation.  Lots of secretions.  Gets very agitated with wake up  assessment.  Hoping to wean.  Objective:  Vital signs for last 24 hours: Temp:  [98.2 F (36.8 C)-101.1 F (38.4 C)] 100.2 F (37.9 C) (06/01 0700) Pulse Rate:  [72-94] 94 (06/01 0716) Resp:  [17-28] 21 (06/01 0716) BP: (88-133)/(45-109) 102/58 mmHg (06/01 0716) SpO2:  [95 %-100 %] 98 % (06/01 0716) FiO2 (%):  [40 %] 40 % (06/01 0716) Weight:  [79.2 kg (174 lb 9.7 oz)] 79.2 kg (174 lb 9.7 oz) (06/01 0200)  Hemodynamic parameters for last 24 hours: CVP:  [9 mmHg-19 mmHg] 12 mmHg  Intake/Output from previous day: 05/31 0701 - 06/01 0700 In: 3241.7 [I.V.:1081.7; NG/GT:2010; IV Piggyback:150] Out: 1685 [Urine:1535; Emesis/NG output:75; Stool:75]  Intake/Output this shift:    Vent settings for last 24 hours: Vent Mode:  [-] PRVC FiO2 (%):  [40 %] 40 % Set Rate:  [18 bmp] 18 bmp Vt Set:  [490 mL] 490 mL PEEP:  [5 cmH20] 5 cmH20 Plateau Pressure:  [17 cmH20-22 cmH20] 21 cmH20  Physical Exam:  General: no respiratory distress and just moved a bit with the nurses and got a bit agitated. Neuro: nonfocal exam, RASS 0, RASS -1 and agitated Resp: clear to auscultation bilaterally and even with a lot of secretiions, auscultation is clear CVS: regular rate and rhythm, S1, S2 normal, no murmur, click, rub or gallop and intermittent sinus tachycardia GI: soft, nontender, BS WNL, no r/g and tolerating tube feedings with diarrhea Extremities: edema 2+ and a bit fluid overloaded.  Results for orders placed or performed during the hospital encounter of 01/06/16 (from the past 24 hour(s))  I-STAT 3, arterial blood gas (G3+)     Status: Abnormal   Collection Time: 01/17/16 10:46 AM  Result Value Ref Range   pH, Arterial 7.404 7.350  - 7.450   pCO2 arterial 51.5 (H) 35.0 - 45.0 mmHg   pO2, Arterial 98.0 80.0 - 100.0 mmHg   Bicarbonate 31.9 (H) 20.0 - 24.0 mEq/L   TCO2 33 0 - 100 mmol/L   O2 Saturation 97.0 %   Acid-Base Excess 6.0 (H) 0.0 - 2.0 mmol/L   Patient temperature 100.4 F    Collection site RADIAL, ALLEN'S TEST ACCEPTABLE    Drawn by Operator    Sample type ARTERIAL   Glucose, capillary     Status: Abnormal   Collection Time: 01/17/16 11:23 AM  Result Value Ref Range   Glucose-Capillary 122 (H) 65 - 99 mg/dL   Comment 1 Capillary Specimen    Comment 2 Notify RN   Glucose, capillary     Status: Abnormal   Collection Time: 01/17/16  4:15 PM  Result Value Ref Range   Glucose-Capillary 148 (H) 65 - 99 mg/dL   Comment 1 Capillary Specimen    Comment 2 Notify RN   Glucose, capillary     Status: Abnormal   Collection Time: 01/17/16  8:12 PM  Result Value Ref Range   Glucose-Capillary 115 (H) 65 - 99 mg/dL   Comment 1 Capillary Specimen   Glucose, capillary     Status: Abnormal   Collection Time: 01/18/16 12:37 AM  Result Value Ref Range   Glucose-Capillary 152 (H) 65 - 99 mg/dL   Comment 1 Notify RN    Comment 2 Document in Chart   Glucose, capillary     Status: Abnormal   Collection Time: 01/18/16  3:32 AM  Result Value Ref Range   Glucose-Capillary 124 (H) 65 - 99 mg/dL   Comment 1 Notify RN    Comment  2 Document in Chart      Assessment/Plan:   NEURO  Altered Mental Status:  agitation, delirium and sedation   Plan: Hoping to control this and get the patient extubated.  If not will need to get trach next week.  PULM  Respiratory Acidosis (chronic) Atelectasis/collapse (focal and bibasilar.  No air leak from chest tube and minimal output.  Will remove the chest tube.)   Plan: Remove chest tube and get chest x-ray at noon.  Try to wean from the ventilator  CARDIO  Sinus Tachycardia   Plan: No specific treatment.  RENAL  Hypovolemia inthe absence of boluses and Third Spacing Positive on  fluids for the last several days.   Plan: consider Lasix again.  GI  No specific issues   Plan: CPM with tube feedings.  ID  Pneumonia (hospital acquired (not ventilator-associated) Enterobacter pneumonia.  Positive blood cultures for methicillin resistent Staph does not require isolation)   Plan: Stop isolation.  Continue antibiotics for PNA  HEME  Anemia acute blood loss anemia and anemia of critical illness)   Plan: Recheck labs tomorrow.  ENDO No known issues   Plan: CPM  Global Issues  Wean, remove chest tube.  Add seroquel and Klonopin.  Lasix.  Lovenox.      LOS: 12 days   Additional comments:I reviewed the patients new imaging test results. CXR  Critical Care Total Time*: 30 Minutes  Payten Beaumier 01/18/2016  *Care during the described time interval was provided by me and/or other providers on the critical care team.  I have reviewed this patient's available data, including medical history, events of note, physical examination and test results as part of my evaluation.

## 2016-01-19 ENCOUNTER — Inpatient Hospital Stay (HOSPITAL_COMMUNITY): Payer: Medicaid Other

## 2016-01-19 LAB — CBC WITH DIFFERENTIAL/PLATELET
BASOS ABS: 0 10*3/uL (ref 0.0–0.1)
BASOS PCT: 0 %
Eosinophils Absolute: 0.2 10*3/uL (ref 0.0–0.7)
Eosinophils Relative: 2 %
HEMATOCRIT: 28.7 % — AB (ref 36.0–46.0)
HEMOGLOBIN: 8.6 g/dL — AB (ref 12.0–15.0)
LYMPHS PCT: 18 %
Lymphs Abs: 1.8 10*3/uL (ref 0.7–4.0)
MCH: 29.7 pg (ref 26.0–34.0)
MCHC: 30 g/dL (ref 30.0–36.0)
MCV: 99 fL (ref 78.0–100.0)
MONO ABS: 0.5 10*3/uL (ref 0.1–1.0)
Monocytes Relative: 5 %
NEUTROS ABS: 7.5 10*3/uL (ref 1.7–7.7)
NEUTROS PCT: 75 %
Platelets: 345 10*3/uL (ref 150–400)
RBC: 2.9 MIL/uL — ABNORMAL LOW (ref 3.87–5.11)
RDW: 15.9 % — AB (ref 11.5–15.5)
WBC: 10 10*3/uL (ref 4.0–10.5)

## 2016-01-19 LAB — BASIC METABOLIC PANEL
ANION GAP: 7 (ref 5–15)
BUN: 22 mg/dL — ABNORMAL HIGH (ref 6–20)
CALCIUM: 8.2 mg/dL — AB (ref 8.9–10.3)
CHLORIDE: 101 mmol/L (ref 101–111)
CO2: 31 mmol/L (ref 22–32)
Creatinine, Ser: 0.52 mg/dL (ref 0.44–1.00)
GFR calc non Af Amer: >60 mL/min (ref >60)
GLUCOSE: 191 mg/dL — AB (ref 65–99)
POTASSIUM: 4 mmol/L (ref 3.5–5.1)
Sodium: 139 mmol/L (ref 135–145)

## 2016-01-19 LAB — HEPATIC FUNCTION PANEL
ALK PHOS: 307 U/L — AB (ref 38–126)
ALT: 49 U/L (ref 14–54)
AST: 38 U/L (ref 15–41)
Albumin: 1.9 g/dL — ABNORMAL LOW (ref 3.5–5.0)
BILIRUBIN DIRECT: 0.1 mg/dL (ref 0.1–0.5)
BILIRUBIN INDIRECT: 0.1 mg/dL — AB (ref 0.3–0.9)
TOTAL PROTEIN: 5.5 g/dL — AB (ref 6.5–8.1)
Total Bilirubin: 0.2 mg/dL — ABNORMAL LOW (ref 0.3–1.2)

## 2016-01-19 LAB — GLUCOSE, CAPILLARY
GLUCOSE-CAPILLARY: 141 mg/dL — AB (ref 65–99)
GLUCOSE-CAPILLARY: 122 mg/dL — AB (ref 65–99)
Glucose-Capillary: 171 mg/dL — ABNORMAL HIGH (ref 65–99)
Glucose-Capillary: 166 mg/dL — ABNORMAL HIGH (ref 65–99)
Glucose-Capillary: 133 mg/dL — ABNORMAL HIGH (ref 65–99)

## 2016-01-19 LAB — POCT I-STAT 3, ART BLOOD GAS (G3+)
ACID-BASE EXCESS: 10 mmol/L — AB (ref 0.0–2.0)
Bicarbonate: 32.8 meq/L — ABNORMAL HIGH (ref 20.0–24.0)
O2 SAT: 99 %
PH ART: 7.541 — AB (ref 7.350–7.450)
TCO2: 34 mmol/L (ref 0–100)
pCO2 arterial: 38.5 mmHg (ref 35.0–45.0)
pO2, Arterial: 126 mmHg — ABNORMAL HIGH (ref 80.0–100.0)

## 2016-01-19 MED ORDER — DEXMEDETOMIDINE HCL IN NACL 400 MCG/100ML IV SOLN
0.0000 ug/kg/h | INTRAVENOUS | Status: DC
  Administered 2016-01-19 – 2016-01-20 (×2): 1 ug/kg/h via INTRAVENOUS
  Filled 2016-01-19 (×2): qty 100

## 2016-01-19 NOTE — Progress Notes (Signed)
EEG Completed; Results Pending  
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
## 2016-01-19 NOTE — Progress Notes (Signed)
Patient ID: [name], female   DOB: 07/11/1971, 45 y.o.   MRN: [id number] Intubated. Plan for peg and trach next week. Will get neuro to do an EEG

## 2016-01-19 NOTE — Progress Notes (Signed)
01/19/2016 9:40 AM Nursing note Blood gas results called [facility] DurhamMichael Jeffries [facility] MD to review and return call with orders if needed. Will continue to closely monitor patient.  Andi Layfield, Blanchard KelchStephanie Ingold

## 2016-01-20 ENCOUNTER — Inpatient Hospital Stay (HOSPITAL_COMMUNITY): Payer: Medicaid Other

## 2016-01-20 LAB — CBC WITH DIFFERENTIAL/PLATELET
BASOS ABS: 0 10*3/uL (ref 0.0–0.1)
Basophils Relative: 0 %
EOS PCT: 2 %
Eosinophils Absolute: 0.2 10*3/uL (ref 0.0–0.7)
HEMATOCRIT: 30.8 % — AB (ref 36.0–46.0)
Hemoglobin: 9.2 g/dL — ABNORMAL LOW (ref 12.0–15.0)
LYMPHS PCT: 19 %
Lymphs Abs: 2.3 10*3/uL (ref 0.7–4.0)
MCH: 29.8 pg (ref 26.0–34.0)
MCHC: 29.9 g/dL — AB (ref 30.0–36.0)
MCV: 99.7 fL (ref 78.0–100.0)
MONO ABS: 0.6 10*3/uL (ref 0.1–1.0)
MONOS PCT: 5 %
NEUTROS ABS: 8.7 10*3/uL — AB (ref 1.7–7.7)
Neutrophils Relative %: 73 %
PLATELETS: 388 10*3/uL (ref 150–400)
RBC: 3.09 MIL/uL — ABNORMAL LOW (ref 3.87–5.11)
RDW: 15.9 % — AB (ref 11.5–15.5)
WBC: 11.9 10*3/uL — ABNORMAL HIGH (ref 4.0–10.5)

## 2016-01-20 LAB — GLUCOSE, CAPILLARY
GLUCOSE-CAPILLARY: 136 mg/dL — AB (ref 65–99)
GLUCOSE-CAPILLARY: 128 mg/dL — AB (ref 65–99)
GLUCOSE-CAPILLARY: 142 mg/dL — AB (ref 65–99)
Glucose-Capillary: 141 mg/dL — ABNORMAL HIGH (ref 65–99)
Glucose-Capillary: 91 mg/dL (ref 65–99)
Glucose-Capillary: 138 mg/dL — ABNORMAL HIGH (ref 65–99)

## 2016-01-20 LAB — BASIC METABOLIC PANEL
Anion gap: 9 (ref 5–15)
BUN: 24 mg/dL — AB (ref 6–20)
CALCIUM: 8.6 mg/dL — AB (ref 8.9–10.3)
CO2: 30 mmol/L (ref 22–32)
CREATININE: 0.68 mg/dL (ref 0.44–1.00)
Chloride: 102 mmol/L (ref 101–111)
GFR calc Af Amer: >60 mL/min (ref >60)
GLUCOSE: 176 mg/dL — AB (ref 65–99)
Potassium: 4 mmol/L (ref 3.5–5.1)
Sodium: 141 mmol/L (ref 135–145)

## 2016-01-20 MED ORDER — FLUCONAZOLE IN SODIUM CHLORIDE 200-0.9 MG/100ML-% IV SOLN
200.0000 mg | Freq: Once | INTRAVENOUS | Status: AC
  Administered 2016-01-20: 200 mg via INTRAVENOUS
  Filled 2016-01-20: qty 100

## 2016-01-20 MED ORDER — NYSTATIN 100000 UNIT/GM EX OINT
TOPICAL_OINTMENT | Freq: Two times a day (BID) | CUTANEOUS | Status: DC
Start: 2016-01-20 — End: 2016-02-05
  Administered 2016-01-20 (×2): 1 via TOPICAL
  Administered 2016-01-21 – 2016-01-22 (×3): via TOPICAL
  Administered 2016-01-23: 1 via TOPICAL
  Administered 2016-01-23: via TOPICAL
  Administered 2016-01-23 – 2016-01-24 (×2): 1 via TOPICAL
  Administered 2016-01-24 – 2016-01-25 (×2): via TOPICAL
  Administered 2016-01-25: 1 via TOPICAL
  Administered 2016-01-26: 10:00:00 via TOPICAL
  Administered 2016-01-26: 1 via TOPICAL
  Administered 2016-01-27 – 2016-01-29 (×5): via TOPICAL
  Administered 2016-01-29 – 2016-01-30 (×2): 1 via TOPICAL
  Administered 2016-01-30 – 2016-02-01 (×4): via TOPICAL
  Administered 2016-02-01: 1 via TOPICAL
  Administered 2016-02-02 – 2016-02-05 (×7): via TOPICAL
  Filled 2016-01-20 (×3): qty 15

## 2016-01-20 MED ORDER — SODIUM CHLORIDE 0.9 % IV BOLUS (SEPSIS)
500.0000 mL | Freq: Once | INTRAVENOUS | Status: AC
  Administered 2016-01-20: 500 mL via INTRAVENOUS

## 2016-01-20 NOTE — Progress Notes (Signed)
Patient placed back on full vent support due to decreased patient effort. 

## 2016-01-20 NOTE — Progress Notes (Signed)
Patient ID: [name], female   DOB: 11/09/1970, 45 y.o.   MRN: [id number] Vital signs are stable Patient is significantly agitated requiring restraints but moving all 4 extremities briskly She will only intermittently follow commands per nurse's There is concern that she may be having symptoms of withdrawal She may require some sedation to manage her agitation at this time
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
## 2016-01-20 NOTE — Progress Notes (Signed)
Wasted 45 mL of Precedex in sink with Docia BarrierJosh McDaniel, RN.

## 2016-01-21 DIAGNOSIS — S061X3A Traumatic cerebral edema with loss of consciousness of 1 hour to 5 hours 59 minutes, initial encounter: Secondary | ICD-10-CM | POA: Diagnosis present

## 2016-01-21 LAB — GLUCOSE, CAPILLARY
GLUCOSE-CAPILLARY: 147 mg/dL — AB (ref 65–99)
GLUCOSE-CAPILLARY: 122 mg/dL — AB (ref 65–99)
GLUCOSE-CAPILLARY: 147 mg/dL — AB (ref 65–99)
Glucose-Capillary: 106 mg/dL — ABNORMAL HIGH (ref 65–99)
Glucose-Capillary: 147 mg/dL — ABNORMAL HIGH (ref 65–99)
Glucose-Capillary: 150 mg/dL — ABNORMAL HIGH (ref 65–99)
Glucose-Capillary: 153 mg/dL — ABNORMAL HIGH (ref 65–99)

## 2016-01-21 MED ORDER — QUETIAPINE FUMARATE 200 MG PO TABS
200.0000 mg | ORAL_TABLET | Freq: Three times a day (TID) | ORAL | Status: DC
Start: 2016-01-21 — End: 2016-01-28
  Administered 2016-01-21 – 2016-01-27 (×20): 200 mg
  Filled 2016-01-21 (×20): qty 1

## 2016-01-21 MED ORDER — CLONAZEPAM 1 MG PO TABS
2.0000 mg | ORAL_TABLET | Freq: Two times a day (BID) | ORAL | Status: DC
  Administered 2016-01-21: 2 mg
  Filled 2016-01-21: qty 2
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
## 2016-01-21 NOTE — Progress Notes (Signed)
Patient placed back on full vent support due to decreased patient effort.

## 2016-01-21 NOTE — Progress Notes (Signed)
Patient ID: [name], female   DOB: 06/05/1971, 45 y.o.   MRN: [id number] Vital signs are stable Patient requiring significant sedation Has marked perspiration when meds are withdrawn in addition to severe agitation Findings consistent with withdrawal syndrome Continue supportive care

## 2016-01-22 ENCOUNTER — Inpatient Hospital Stay (HOSPITAL_COMMUNITY): Payer: Medicaid Other

## 2016-01-22 LAB — GLUCOSE, CAPILLARY
GLUCOSE-CAPILLARY: 148 mg/dL — AB (ref 65–99)
GLUCOSE-CAPILLARY: 159 mg/dL — AB (ref 65–99)
GLUCOSE-CAPILLARY: 176 mg/dL — AB (ref 65–99)
Glucose-Capillary: 130 mg/dL — ABNORMAL HIGH (ref 65–99)
Glucose-Capillary: 144 mg/dL — ABNORMAL HIGH (ref 65–99)

## 2016-01-22 LAB — CBC WITH DIFFERENTIAL/PLATELET
Basophils Absolute: 0 10*3/uL (ref 0.0–0.1)
Basophils Relative: 0 %
EOS PCT: 3 %
Eosinophils Absolute: 0.2 10*3/uL (ref 0.0–0.7)
HEMATOCRIT: 29.2 % — AB (ref 36.0–46.0)
Hemoglobin: 8.6 g/dL — ABNORMAL LOW (ref 12.0–15.0)
LYMPHS PCT: 23 %
Lymphs Abs: 1.6 10*3/uL (ref 0.7–4.0)
MCH: 28.9 pg (ref 26.0–34.0)
MCHC: 29.5 g/dL — AB (ref 30.0–36.0)
MCV: 98 fL (ref 78.0–100.0)
MONO ABS: 0.5 10*3/uL (ref 0.1–1.0)
MONOS PCT: 8 %
NEUTROS ABS: 4.6 10*3/uL (ref 1.7–7.7)
Neutrophils Relative %: 67 %
PLATELETS: 302 10*3/uL (ref 150–400)
RBC: 2.98 MIL/uL — ABNORMAL LOW (ref 3.87–5.11)
RDW: 16.2 % — AB (ref 11.5–15.5)
WBC: 6.9 10*3/uL (ref 4.0–10.5)

## 2016-01-22 LAB — BLOOD GAS, ARTERIAL
Acid-Base Excess: 3.6 mmol/L — ABNORMAL HIGH (ref 0.0–2.0)
Bicarbonate: 28 mEq/L — ABNORMAL HIGH (ref 20.0–24.0)
DRAWN BY: 42624
FIO2: 0.4
LHR: 18 {breaths}/min
MECHVT: 490 mL
O2 Saturation: 98.4 %
PATIENT TEMPERATURE: 98.6
PCO2 ART: 45.7 mmHg — AB (ref 35.0–45.0)
PEEP: 5 cmH2O
PO2 ART: 117 mmHg — AB (ref 80.0–100.0)
TCO2: 29.4 mmol/L (ref 0–100)
pH, Arterial: 7.404 (ref 7.350–7.450)

## 2016-01-22 LAB — BASIC METABOLIC PANEL
Anion gap: 5 (ref 5–15)
BUN: 20 mg/dL (ref 6–20)
CALCIUM: 8.5 mg/dL — AB (ref 8.9–10.3)
CO2: 30 mmol/L (ref 22–32)
CREATININE: 0.45 mg/dL (ref 0.44–1.00)
Chloride: 107 mmol/L (ref 101–111)
GFR calc Af Amer: >60 mL/min (ref >60)
GFR calc non Af Amer: >60 mL/min (ref >60)
GLUCOSE: 139 mg/dL — AB (ref 65–99)
Potassium: 4 mmol/L (ref 3.5–5.1)
Sodium: 142 mmol/L (ref 135–145)

## 2016-01-22 MED ORDER — CLONAZEPAM 1 MG PO TABS
2.0000 mg | ORAL_TABLET | Freq: Three times a day (TID) | ORAL | Status: DC
  Administered 2016-01-22 – 2016-01-25 (×9): 2 mg
  Filled 2016-01-22 (×9): qty 2

## 2016-01-22 MED ORDER — IPRATROPIUM-ALBUTEROL 0.5-2.5 (3) MG/3ML IN SOLN
3.0000 mL | Freq: Four times a day (QID) | RESPIRATORY_TRACT | Status: DC
  Administered 2016-01-22 – 2016-01-29 (×29): 3 mL via RESPIRATORY_TRACT
  Filled 2016-01-22 (×29): qty 3

## 2016-01-22 MED ORDER — FUROSEMIDE 10 MG/ML IJ SOLN
40.0000 mg | Freq: Once | INTRAMUSCULAR | Status: AC
  Administered 2016-01-22: 40 mg via INTRAVENOUS
  Filled 2016-01-22: qty 4

## 2016-01-22 MED ORDER — POTASSIUM CHLORIDE 10 MEQ/50ML IV SOLN
10.0000 meq | INTRAVENOUS | Status: AC
  Administered 2016-01-22 (×2): 10 meq via INTRAVENOUS
  Filled 2016-01-22 (×2): qty 50
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
## 2016-01-22 NOTE — Progress Notes (Signed)
Patient ID: [name], female   DOB: December 06, 1970, 45 y.o.   MRN: [id number] I met with her mother and sister and discussed the planned tracheostomy and PEG placement. Dr. Hulen Skains plans to do these Wednesday. I discussed the procedures, risks, and benefits and answered their questions. [name]'s mother signed consent for each. Georganna Skeans, MD, MPH, FACS Trauma: [phone] General Surgery: [phone]

## 2016-01-22 NOTE — Progress Notes (Signed)
Patient placed back on full vent support due to increased heart rate (140's).

## 2016-01-23 ENCOUNTER — Inpatient Hospital Stay (HOSPITAL_COMMUNITY): Payer: Medicaid Other

## 2016-01-23 LAB — BASIC METABOLIC PANEL
ANION GAP: 6 (ref 5–15)
BUN: 25 mg/dL — ABNORMAL HIGH (ref 6–20)
CALCIUM: 8.7 mg/dL — AB (ref 8.9–10.3)
CO2: 31 mmol/L (ref 22–32)
CREATININE: 0.52 mg/dL (ref 0.44–1.00)
Chloride: 105 mmol/L (ref 101–111)
Glucose, Bld: 154 mg/dL — ABNORMAL HIGH (ref 65–99)
Potassium: 3.7 mmol/L (ref 3.5–5.1)
SODIUM: 142 mmol/L (ref 135–145)

## 2016-01-23 LAB — GLUCOSE, CAPILLARY
GLUCOSE-CAPILLARY: 138 mg/dL — AB (ref 65–99)
GLUCOSE-CAPILLARY: 144 mg/dL — AB (ref 65–99)
GLUCOSE-CAPILLARY: 144 mg/dL — AB (ref 65–99)
GLUCOSE-CAPILLARY: 148 mg/dL — AB (ref 65–99)
GLUCOSE-CAPILLARY: 148 mg/dL — AB (ref 65–99)
GLUCOSE-CAPILLARY: 163 mg/dL — AB (ref 65–99)
Glucose-Capillary: 170 mg/dL — ABNORMAL HIGH (ref 65–99)

## 2016-01-23 LAB — CBC
HEMATOCRIT: 30.2 % — AB (ref 36.0–46.0)
Hemoglobin: 9.1 g/dL — ABNORMAL LOW (ref 12.0–15.0)
MCH: 29.8 pg (ref 26.0–34.0)
MCHC: 30.1 g/dL (ref 30.0–36.0)
MCV: 99 fL (ref 78.0–100.0)
Platelets: 293 10*3/uL (ref 150–400)
RBC: 3.05 MIL/uL — ABNORMAL LOW (ref 3.87–5.11)
RDW: 16.1 % — AB (ref 11.5–15.5)
WBC: 7.2 10*3/uL (ref 4.0–10.5)

## 2016-01-23 MED ORDER — FUROSEMIDE 10 MG/ML IJ SOLN
40.0000 mg | Freq: Once | INTRAMUSCULAR | Status: AC
  Administered 2016-01-23: 40 mg via INTRAVENOUS
  Filled 2016-01-23: qty 4

## 2016-01-23 NOTE — Progress Notes (Signed)
Spoke with pt's mother by phone, as she mentioned to bedside nurse she needed to speak with case Production designer, theatre/television/filmmanager.  She has questions about pending Medicaid application..per financial counselor, [name]'s notes, mother has an appt with MEDICAID caseworker on 01/24/16 at 10am in patient's room to fill out Medicaid application.  Explained this to mother, and she now understands.  She has no questions, and will meet case worker on 6/7.    April BatonJulie W. Oney Tatlock, RN, BSN  Trauma/Neuro ICU Case Manager [phone]
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
## 2016-01-23 NOTE — Progress Notes (Signed)
Patient ID: [name], female   DOB: 03/22/1971, 45 y.o.   MRN: [id number] For PEG and tracheostomy in am. No neuro changes   eeg pending

## 2016-01-23 NOTE — Progress Notes (Signed)
30mL Versed via bag, wasted in sink. [place] Allen, RN witnessed.

## 2016-01-23 NOTE — Progress Notes (Signed)
Follow up - Trauma and Critical Care  Patient Details:    [name] is an 45 y.o. female.  Lines/tubes : Airway 7.5 mm (Active)  Secured at (cm) 22 cm 01/23/2016  4:32 AM  Measured From Lips 01/23/2016  4:32 AM  Secured Location Left 01/23/2016  4:32 AM  Secured By [employer] Tube Holder 01/23/2016  4:32 AM  Tube Holder Repositioned Yes 01/23/2016  4:32 AM  Cuff Pressure (cm H2O) 26 cm H2O 01/23/2016  4:32 AM  Site Condition Dry 01/23/2016  4:32 AM     CVC Triple Lumen 01/11/16 Left Subclavian (Active)  Indication for Insertion or Continuance of Line Prolonged intravenous therapies 01/22/2016  8:00 PM  Site Assessment Clean;Dry;Intact 01/22/2016  8:00 PM  Proximal Lumen Status Infusing;Flushed;No blood return 01/22/2016  8:00 PM  Medial Infusing;Flushed;Blood return noted 01/22/2016  8:00 PM  Distal Lumen Status Flushed;Saline locked;No blood return 01/22/2016  8:00 PM  Dressing Type Transparent 01/22/2016  8:00 PM  Dressing Status Clean;Dry;Intact;Antimicrobial disc in place 01/22/2016  8:00 PM  Line Care Connections checked and tightened 01/22/2016  8:00 PM  Dressing Intervention Dressing changed;Antimicrobial disc changed 01/17/2016  4:00 AM  Dressing Change Due 01/24/16 01/22/2016  8:00 PM     Rectal Tube/Pouch (Active)  Output (mL) 20 mL 01/22/2016  4:00 AM  Intake (mL) 0 mL 01/18/2016  8:00 PM     Urethral Catheter [name], EMT Non-latex;Temperature probe 14 Fr. (Active)  Indication for Insertion or Continuance of Catheter Aggressive IV diuresis 01/22/2016  8:00 PM  Site Assessment Intact;Swelling;Red 01/22/2016  8:00 PM  Catheter Maintenance Bag below level of bladder;Catheter secured;Drainage bag/tubing not touching floor;Insertion date on drainage bag;No dependent loops;Seal intact;Bag emptied prior to transport 01/22/2016  8:00 PM  Collection Container Standard drainage bag 01/22/2016  8:00 PM  Securement Method Securing device (Describe) 01/22/2016  8:00 PM  Urinary Catheter Interventions Unclamped  01/22/2016  3:00 PM  Input (mL) 30 mL 01/13/2016  4:00 AM  Output (mL) 110 mL 01/23/2016  6:00 AM    Microbiology/Sepsis markers: Results for orders placed or performed during the hospital encounter of 01/06/16  MRSA PCR Screening     Status: None   Collection Time: 01/06/16  1:45 PM  Result Value Ref Range Status   MRSA by PCR NEGATIVE NEGATIVE Final    Comment:        The GeneXpert MRSA Assay (FDA approved for NASAL specimens only), is one component of a comprehensive MRSA colonization surveillance program. It is not intended to diagnose MRSA infection nor to guide or monitor treatment for MRSA infections.   Culture, respiratory (NON-Expectorated)     Status: None   Collection Time: 01/10/16  7:59 PM  Result Value Ref Range Status   Specimen Description TRACHEAL ASPIRATE  Final   Special Requests NONE  Final   Gram Stain   Final    MODERATE WBC PRESENT,BOTH PMN AND MONONUCLEAR NO ORGANISMS SEEN    Culture MODERATE ENTEROBACTER AEROGENES  Final   Report Status 01/13/2016 FINAL  Final   Organism ID, Bacteria ENTEROBACTER AEROGENES  Final      Susceptibility   Enterobacter aerogenes - MIC*    CEFAZOLIN >=64 RESISTANT Resistant     CEFEPIME <=1 SENSITIVE Sensitive     CEFTAZIDIME <=1 SENSITIVE Sensitive     CEFTRIAXONE <=1 SENSITIVE Sensitive     CIPROFLOXACIN <=0.25 SENSITIVE Sensitive     GENTAMICIN <=1 SENSITIVE Sensitive     IMIPENEM 1 SENSITIVE Sensitive     TRIMETH/SULFA <=  20 SENSITIVE Sensitive     PIP/TAZO 8 SENSITIVE Sensitive     * MODERATE ENTEROBACTER AEROGENES  Culture, blood (routine x 2)     Status: Abnormal   Collection Time: 01/11/16  9:57 AM  Result Value Ref Range Status   Specimen Description BLOOD RIGHT ARM  Final   Special Requests IN PEDIATRIC BOTTLE 3CC  Final   Culture  Setup Time   Final    GRAM POSITIVE COCCI IN CLUSTERS AEROBIC BOTTLE ONLY CRITICAL RESULT CALLED TO, READ BACK BY AND VERIFIED WITH: ANDREW MEYER,PHARMD @0744  01/12/16 MKELLY     Culture (A)  Final    STAPHYLOCOCCUS SPECIES (COAGULASE NEGATIVE) SUSCEPTIBILITIES PERFORMED ON PREVIOUS CULTURE WITHIN THE LAST 5 DAYS.    Report Status 01/15/2016 FINAL  Final  Blood Culture ID Panel (Reflexed)     Status: Abnormal   Collection Time: 01/11/16  9:57 AM  Result Value Ref Range Status   Enterococcus species NOT DETECTED NOT DETECTED Final   Vancomycin resistance NOT DETECTED NOT DETECTED Final   Listeria monocytogenes NOT DETECTED NOT DETECTED Final   Staphylococcus species DETECTED (A) NOT DETECTED Final    Comment: CRITICAL RESULT CALLED TO, READ BACK BY AND VERIFIED WITH: ANDREW MEYER,PHARMD @0744  01/12/16 MKELLY    Staphylococcus aureus NOT DETECTED NOT DETECTED Final   Methicillin resistance DETECTED (A) NOT DETECTED Final    Comment: CRITICAL RESULT CALLED TO, READ BACK BY AND VERIFIED WITH: ANDREW MEYER,PHARMD @0744  01/12/16 MKELLY    Streptococcus species NOT DETECTED NOT DETECTED Final   Streptococcus agalactiae NOT DETECTED NOT DETECTED Final   Streptococcus pneumoniae NOT DETECTED NOT DETECTED Final   Streptococcus pyogenes NOT DETECTED NOT DETECTED Final   Acinetobacter baumannii NOT DETECTED NOT DETECTED Final   Enterobacteriaceae species NOT DETECTED NOT DETECTED Final   Enterobacter cloacae complex NOT DETECTED NOT DETECTED Final   Escherichia coli NOT DETECTED NOT DETECTED Final   Klebsiella oxytoca NOT DETECTED NOT DETECTED Final   Klebsiella pneumoniae NOT DETECTED NOT DETECTED Final   Proteus species NOT DETECTED NOT DETECTED Final   Serratia marcescens NOT DETECTED NOT DETECTED Final   Carbapenem resistance NOT DETECTED NOT DETECTED Final   Haemophilus influenzae NOT DETECTED NOT DETECTED Final   Neisseria meningitidis NOT DETECTED NOT DETECTED Final   Pseudomonas aeruginosa NOT DETECTED NOT DETECTED Final   Candida albicans NOT DETECTED NOT DETECTED Final   Candida glabrata NOT DETECTED NOT DETECTED Final   Candida krusei NOT DETECTED NOT  DETECTED Final   Candida parapsilosis NOT DETECTED NOT DETECTED Final   Candida tropicalis NOT DETECTED NOT DETECTED Final  Culture, blood (routine x 2)     Status: Abnormal   Collection Time: 01/11/16 10:00 AM  Result Value Ref Range Status   Specimen Description BLOOD RIGHT HAND  Final   Special Requests IN PEDIATRIC BOTTLE 1.5CC  Final   Culture  Setup Time   Final    GRAM POSITIVE COCCI IN CLUSTERS AEROBIC BOTTLE ONLY CRITICAL RESULT CALLED TO, READ BACK BY AND VERIFIED WITH: ANDREW MEYER,PHARMD @0744  01/12/16 MKELLY    Culture STAPHYLOCOCCUS SPECIES (COAGULASE NEGATIVE) (A)  Final   Report Status 01/15/2016 FINAL  Final   Organism ID, Bacteria STAPHYLOCOCCUS SPECIES (COAGULASE NEGATIVE)  Final      Susceptibility   Staphylococcus species (coagulase negative) - MIC*    CIPROFLOXACIN <=0.5 SENSITIVE Sensitive     ERYTHROMYCIN >=8 RESISTANT Resistant     GENTAMICIN <=0.5 SENSITIVE Sensitive     OXACILLIN >=4 RESISTANT Resistant  TETRACYCLINE >=16 RESISTANT Resistant     VANCOMYCIN 2 SENSITIVE Sensitive     TRIMETH/SULFA <=10 SENSITIVE Sensitive     CLINDAMYCIN <=0.25 RESISTANT Resistant     RIFAMPIN <=0.5 SENSITIVE Sensitive     Inducible Clindamycin POSITIVE Resistant     * STAPHYLOCOCCUS SPECIES (COAGULASE NEGATIVE)  Culture, blood (routine x 2)     Status: None   Collection Time: 01/13/16  2:05 AM  Result Value Ref Range Status   Specimen Description BLOOD BLOOD RIGHT HAND  Final   Special Requests IN PEDIATRIC BOTTLE  1CC  Final   Culture NO GROWTH 5 DAYS  Final   Report Status 01/18/2016 FINAL  Final  Culture, blood (routine x 2)     Status: None   Collection Time: 01/13/16  2:10 AM  Result Value Ref Range Status   Specimen Description BLOOD BLOOD RIGHT HAND  Final   Special Requests IN PEDIATRIC BOTTLE  [facility]  Final   Culture NO GROWTH 5 DAYS  Final   Report Status 01/18/2016 FINAL  Final    Anti-infectives:  Anti-infectives    Start     Dose/Rate Route  Frequency Ordered Stop   01/20/16 0815  fluconazole (DIFLUCAN) IVPB 200 mg     200 mg 100 mL/hr over 60 Minutes Intravenous  Once 01/20/16 0804 01/20/16 1000   01/16/16 1600  levofloxacin (LEVAQUIN) IVPB 750 mg     750 mg 100 mL/hr over 90 Minutes Intravenous Every 24 hours 01/16/16 0953 01/27/16 0908   01/13/16 1200  vancomycin (VANCOCIN) 1,250 mg in sodium chloride 0.9 % 250 mL IVPB  Status:  Discontinued     1,250 mg 166.7 mL/hr over 90 Minutes Intravenous Every 8 hours 01/13/16 1109 01/16/16 0953   01/11/16 1800  vancomycin (VANCOCIN) IVPB 1000 mg/200 mL premix  Status:  Discontinued     1,000 mg 200 mL/hr over 60 Minutes Intravenous Every 8 hours 01/11/16 0932 01/13/16 1109   01/11/16 1600  imipenem-cilastatin (PRIMAXIN) 500 mg in sodium chloride 0.9 % 100 mL IVPB  Status:  Discontinued     500 mg 200 mL/hr over 30 Minutes Intravenous Every 6 hours 01/11/16 0941 01/16/16 0953   01/11/16 1000  vancomycin (VANCOCIN) 1,500 mg in sodium chloride 0.9 % 500 mL IVPB     1,500 mg 250 mL/hr over 120 Minutes Intravenous  Once 01/11/16 0932 01/11/16 1247   01/11/16 1000  imipenem-cilastatin (PRIMAXIN) 250 mg in sodium chloride 0.9 % 100 mL IVPB     250 mg 200 mL/hr over 30 Minutes Intravenous  Once 01/11/16 0941 01/11/16 1052      Best Practice/Protocols:  VTE Prophylaxis: Lovenox (prophylaxtic dose) and Mechanical GI Prophylaxis: Proton Pump Inhibitor Continous Sedation  Consults: Treatment Team:  Trauma Md, MD Sheral Apley, MD Hilda Lias, MD    Events:  Subjective:    Overnight Issues: Patient has not weaned well in the last 24 hours.  Objective:  Vital signs for last 24 hours: Temp:  [99.1 F (37.3 C)-101.1 F (38.4 C)] 99.7 F (37.6 C) (06/06 0700) Pulse Rate:  [85-115] 91 (06/06 0700) Resp:  [13-20] 18 (06/06 0700) BP: (104-175)/(48-113) 105/59 mmHg (06/06 0700) SpO2:  [93 %-99 %] 97 % (06/06 0700) FiO2 (%):  [40 %] 40 % (06/06 0445) Weight:  [76.2 kg  (167 lb 15.9 oz)] 76.2 kg (167 lb 15.9 oz) (06/06 0350)  Hemodynamic parameters for last 24 hours:    Intake/Output from previous day: 06/05 0701 - 06/06 0700 In: 1985.4 [  I.V.:445.4; NG/GT:1440; IV Piggyback:100] Out: 3925 [Urine:3925]  Intake/Output this shift:    Vent settings for last 24 hours: Vent Mode:  [-] PRVC FiO2 (%):  [40 %] 40 % Set Rate:  [18 bmp] 18 bmp Vt Set:  [490 mL] 490 mL PEEP:  [5 cmH20] 5 cmH20 Plateau Pressure:  [18 cmH20-22 cmH20] 22 cmH20  Physical Exam:  General: no respiratory distress and but her peek airway pressures are high. Neuro: nonfocal exam, RASS 0, agitated and gets agitated when ever she awakens Resp: wheezes RLL, RML and RUL CVS: regular rate and rhythm, S1, S2 normal, no murmur, click, rub or gallop GI: soft, nontender, BS WNL, no r/g and tolerating tube feedings well with diarrhea Extremities: no edema, no erythema, pulses WNL and edema 1+  Results for orders placed or performed during the hospital encounter of 01/06/16 (from the past 24 hour(s))  Glucose, capillary     Status: Abnormal   Collection Time: 01/22/16  8:30 AM  Result Value Ref Range   Glucose-Capillary 159 (H) 65 - 99 mg/dL   Comment 1 Notify RN    Comment 2 Document in Chart   Glucose, capillary     Status: Abnormal   Collection Time: 01/22/16 11:43 AM  Result Value Ref Range   Glucose-Capillary 176 (H) 65 - 99 mg/dL   Comment 1 Notify RN    Comment 2 Document in Chart   Glucose, capillary     Status: Abnormal   Collection Time: 01/22/16  3:44 PM  Result Value Ref Range   Glucose-Capillary 130 (H) 65 - 99 mg/dL   Comment 1 Notify RN    Comment 2 Document in Chart   Glucose, capillary     Status: Abnormal   Collection Time: 01/22/16  7:55 PM  Result Value Ref Range   Glucose-Capillary 144 (H) 65 - 99 mg/dL  Glucose, capillary     Status: Abnormal   Collection Time: 01/23/16 12:00 AM  Result Value Ref Range   Glucose-Capillary 144 (H) 65 - 99 mg/dL  Glucose,  capillary     Status: Abnormal   Collection Time: 01/23/16  3:43 AM  Result Value Ref Range   Glucose-Capillary 144 (H) 65 - 99 mg/dL  CBC     Status: Abnormal   Collection Time: 01/23/16  4:24 AM  Result Value Ref Range   WBC 7.2 4.0 - 10.5 K/uL   RBC 3.05 (L) 3.87 - 5.11 MIL/uL   Hemoglobin 9.1 (L) 12.0 - 15.0 g/dL   HCT 62.1 (L) 30.8 - 65.7 %   MCV 99.0 78.0 - 100.0 fL   MCH 29.8 26.0 - 34.0 pg   MCHC 30.1 30.0 - 36.0 g/dL   RDW 84.6 (H) 96.2 - 95.2 %   Platelets 293 150 - 400 K/uL  Basic metabolic panel     Status: Abnormal   Collection Time: 01/23/16  4:24 AM  Result Value Ref Range   Sodium 142 135 - 145 mmol/L   Potassium 3.7 3.5 - 5.1 mmol/L   Chloride 105 101 - 111 mmol/L   CO2 31 22 - 32 mmol/L   Glucose, Bld 154 (H) 65 - 99 mg/dL   BUN 25 (H) 6 - 20 mg/dL   Creatinine, Ser 8.41 0.44 - 1.00 mg/dL   Calcium 8.7 (L) 8.9 - 10.3 mg/dL   GFR calc non Af Amer >60 >60 mL/min   GFR calc Af Amer >60 >60 mL/min   Anion gap 6 5 - 15  Assessment/Plan:   NEURO  Altered Mental Status:  agitation, delirium and sedation   Plan: Trying to find the right combination of medications for adequate sedation.  PULM  Atelectasis/collapse (focal and bibasilar)   Plan: Tracheostomy and PEG tomorrow   CARDIO  No significant issues   Plan: CPM  RENAL  No significant problems.   Plan: CPM.    GI  No significant issues   Plan: Continue tube feedings.    ID  Pneumonia (hospital acquired (not ventilator-associated) Pneumonia being treated appropriately)   Plan: CPM  HEME  Anemia acute blood loss anemia)   Plan: No need for transfusion  ENDO No specific issues   Plan: CPM  Global Issues  Plan for tracheostomy and PEG tomorrow.  Secretions are moderate to thick.      LOS: 17 days   Additional comments:I reviewed the patient's new clinical lab test results. cbc/bmet and I reviewed the patients new imaging test results. cxr  Critical Care Total Time*: 45 Minutes  Mida Cory 01/23/2016  *Care during the described time interval was provided by me and/or other providers on the critical care team.  I have reviewed this patient's available data, including medical history, events of note, physical examination and test results as part of my evaluation.

## 2016-01-23 NOTE — Progress Notes (Signed)
Pt w/ increased WOB, forced exhalation noted, RR 6-8.  Placed pt back on full vent support.  Rn aware.

## 2016-01-24 ENCOUNTER — Encounter (HOSPITAL_COMMUNITY): Payer: Self-pay | Admitting: Certified Registered Nurse Anesthetist

## 2016-01-24 ENCOUNTER — Encounter (HOSPITAL_COMMUNITY): Admission: EM | Disposition: A | Discharge status: Rehabilitation Facility | Payer: Self-pay | Source: Home / Self Care

## 2016-01-24 ENCOUNTER — Inpatient Hospital Stay (HOSPITAL_COMMUNITY): Payer: Medicaid Other

## 2016-01-24 HISTORY — PX: PERCUTANEOUS TRACHEOSTOMY: SHX5288

## 2016-01-24 HISTORY — PX: ESOPHAGOGASTRODUODENOSCOPY: SHX5428

## 2016-01-24 HISTORY — PX: PEG PLACEMENT: SHX5437

## 2016-01-24 LAB — CBC WITH DIFFERENTIAL/PLATELET
Basophils Absolute: 0 10*3/uL (ref 0.0–0.1)
Basophils Relative: 0 %
Eosinophils Absolute: 0.3 10*3/uL (ref 0.0–0.7)
Eosinophils Relative: 2 %
HEMATOCRIT: 32.4 % — AB (ref 36.0–46.0)
Hemoglobin: 9.6 g/dL — ABNORMAL LOW (ref 12.0–15.0)
LYMPHS ABS: 2 10*3/uL (ref 0.7–4.0)
LYMPHS PCT: 17 %
MCH: 28.7 pg (ref 26.0–34.0)
MCHC: 29.6 g/dL — AB (ref 30.0–36.0)
MCV: 97 fL (ref 78.0–100.0)
MONO ABS: 0.8 10*3/uL (ref 0.1–1.0)
MONOS PCT: 7 %
NEUTROS ABS: 8.6 10*3/uL — AB (ref 1.7–7.7)
Neutrophils Relative %: 74 %
Platelets: 282 10*3/uL (ref 150–400)
RBC: 3.34 MIL/uL — ABNORMAL LOW (ref 3.87–5.11)
RDW: 16.2 % — AB (ref 11.5–15.5)
WBC: 11.7 10*3/uL — ABNORMAL HIGH (ref 4.0–10.5)

## 2016-01-24 LAB — BASIC METABOLIC PANEL
ANION GAP: 6 (ref 5–15)
BUN: 24 mg/dL — ABNORMAL HIGH (ref 6–20)
CALCIUM: 8.9 mg/dL (ref 8.9–10.3)
CHLORIDE: 108 mmol/L (ref 101–111)
CO2: 29 mmol/L (ref 22–32)
CREATININE: 0.52 mg/dL (ref 0.44–1.00)
GFR calc non Af Amer: >60 mL/min (ref >60)
Glucose, Bld: 126 mg/dL — ABNORMAL HIGH (ref 65–99)
Potassium: 4.6 mmol/L (ref 3.5–5.1)
SODIUM: 143 mmol/L (ref 135–145)

## 2016-01-24 LAB — GLUCOSE, CAPILLARY
GLUCOSE-CAPILLARY: 102 mg/dL — AB (ref 65–99)
GLUCOSE-CAPILLARY: 103 mg/dL — AB (ref 65–99)
GLUCOSE-CAPILLARY: 109 mg/dL — AB (ref 65–99)
GLUCOSE-CAPILLARY: 110 mg/dL — AB (ref 65–99)
Glucose-Capillary: 126 mg/dL — ABNORMAL HIGH (ref 65–99)
Glucose-Capillary: 109 mg/dL — ABNORMAL HIGH (ref 65–99)

## 2016-01-24 LAB — C DIFFICILE QUICK SCREEN W PCR REFLEX
C DIFFICILE (CDIFF) INTERP: POSITIVE
C DIFFICILE (CDIFF) TOXIN: POSITIVE — AB
C DIFFICLE (CDIFF) ANTIGEN: POSITIVE — AB

## 2016-01-24 SURGERY — CREATION, TRACHEOSTOMY, PERCUTANEOUS
Anesthesia: General | Site: Neck

## 2016-01-24 SURGERY — EGD (ESOPHAGOGASTRODUODENOSCOPY)
Anesthesia: Moderate Sedation

## 2016-01-24 MED ORDER — LIDOCAINE-EPINEPHRINE (PF) 1.5 %-1:200000 IJ SOLN
INTRAMUSCULAR | Status: DC | PRN
  Administered 2016-01-24: 3 mL via PERINEURAL

## 2016-01-24 MED ORDER — VECURONIUM BROMIDE 10 MG IV SOLR
INTRAVENOUS | Status: AC
  Filled 2016-01-24: qty 10

## 2016-01-24 MED ORDER — MIDAZOLAM HCL 2 MG/2ML IJ SOLN
4.0000 mg | Freq: Once | INTRAMUSCULAR | Status: AC
  Administered 2016-01-24: 4 mg via INTRAVENOUS

## 2016-01-24 MED ORDER — VANCOMYCIN 50 MG/ML ORAL SOLUTION
125.0000 mg | Freq: Four times a day (QID) | ORAL | Status: DC
  Administered 2016-01-24 – 2016-02-03 (×39): 125 mg
  Filled 2016-01-24 (×45): qty 2.5

## 2016-01-24 MED ORDER — LOPERAMIDE HCL 1 MG/5ML PO LIQD: 2.0000 mg | ORAL | Status: DC | PRN

## 2016-01-24 MED ORDER — 0.9 % SODIUM CHLORIDE (POUR BTL) OPTIME
TOPICAL | Status: DC | PRN
  Administered 2016-01-24: 1000 mL

## 2016-01-24 MED ORDER — VECURONIUM BROMIDE 10 MG IV SOLR
10.0000 mg | Freq: Once | INTRAVENOUS | Status: AC
  Administered 2016-01-24: 10 mg via INTRAVENOUS

## 2016-01-24 MED ORDER — SODIUM CHLORIDE 0.9% FLUSH
10.0000 mL | INTRAVENOUS | Status: DC | PRN
  Administered 2016-01-30 – 2016-02-05 (×4): 20 mL
  Filled 2016-01-24 (×4): qty 40

## 2016-01-24 MED ORDER — MIDAZOLAM HCL 2 MG/2ML IJ SOLN
2.0000 mg | Freq: Once | INTRAMUSCULAR | Status: AC
Start: 2016-01-24 — End: 2016-01-24
  Administered 2016-01-24: 2 mg via INTRAVENOUS

## 2016-01-24 SURGICAL SUPPLY — 32 items
DRAPE PROXIMA HALF (DRAPES) ×6 IMPLANT
DRAPE UTILITY XL STRL (DRAPES) ×3 IMPLANT
ELECT CAUTERY BLADE 6.4 (BLADE) ×3 IMPLANT
ELECT REM PT RETURN 9FT ADLT (ELECTROSURGICAL) ×3
ELECTRODE REM PT RTRN 9FT ADLT (ELECTROSURGICAL) ×1 IMPLANT
GAUZE SPONGE 4X4 16PLY XRAY LF (GAUZE/BANDAGES/DRESSINGS) ×3 IMPLANT
GLOVE BIO SURGEON STRL SZ 6.5 (GLOVE) IMPLANT
GLOVE BIO SURGEON STRL SZ8 (GLOVE) IMPLANT
GLOVE BIO SURGEONS STRL SZ 6.5 (GLOVE)
GLOVE BIOGEL PI IND STRL 7.0 (GLOVE) IMPLANT
GLOVE BIOGEL PI IND STRL 8 (GLOVE) ×3 IMPLANT
GLOVE BIOGEL PI INDICATOR 7.0 (GLOVE)
GLOVE BIOGEL PI INDICATOR 8 (GLOVE) ×6
GLOVE ECLIPSE 7.5 STRL STRAW (GLOVE) ×3 IMPLANT
GLOVE SURG SS PI 7.5 STRL IVOR (GLOVE) ×3 IMPLANT
GLOVE SURG SS PI 8.0 STRL IVOR (GLOVE) ×6 IMPLANT
GOWN STRL REUS W/ TWL LRG LVL3 (GOWN DISPOSABLE) ×1 IMPLANT
GOWN STRL REUS W/ TWL XL LVL3 (GOWN DISPOSABLE) ×2 IMPLANT
GOWN STRL REUS W/TWL LRG LVL3 (GOWN DISPOSABLE) ×2
GOWN STRL REUS W/TWL XL LVL3 (GOWN DISPOSABLE) ×4
INTRODUCER TRACH BLUE RHINO 6F (TUBING) ×3 IMPLANT
INTRODUCER TRACH BLUE RHINO 8F (TUBING) IMPLANT
NS IRRIG 1000ML POUR BTL (IV SOLUTION) ×3 IMPLANT
PENCIL BUTTON HOLSTER BLD 10FT (ELECTRODE) ×3 IMPLANT
SPONGE DRAIN TRACH 4X4 STRL 2S (GAUZE/BANDAGES/DRESSINGS) ×3 IMPLANT
SPONGE INTESTINAL PEANUT (DISPOSABLE) ×3 IMPLANT
SUT SILK 2 0 SH CR/8 (SUTURE) ×3 IMPLANT
SUT VICRYL AB 3 0 TIES (SUTURE) ×3 IMPLANT
TOWEL OR 17X26 10 PK STRL BLUE (TOWEL DISPOSABLE) ×3 IMPLANT
TUBE CONNECTING 12'X1/4 (SUCTIONS) ×1
TUBE CONNECTING 12X1/4 (SUCTIONS) ×2 IMPLANT
YANKAUER SUCT BULB TIP NO VENT (SUCTIONS) ×3 IMPLANT

## 2016-01-24 NOTE — Op Note (Signed)
OPERATIVE REPORT  DATE OF OPERATION:  01/24/2016  PATIENT:  [name]  45 y.o. female  PRE-OPERATIVE DIAGNOSIS:  prolonged intubation/respiratory failure  POST-OPERATIVE DIAGNOSIS:  prolonged intubation/respiratory failure  FINDINGS:  Normal anatomy  PROCEDURE:  Procedure(s): PERCUTANEOUS TRACHEOSTOMY with #6 Shiley trachoeostomy tube  SURGEON:  Surgeon(s): Jimmye NormanJames Ambrosio Reuter, MD  ASSISTANT: Leotis ShamesJeffery, PA-C  ANESTHESIA:   local and IV sedation  COMPLICATIONS:  Mild intermittent hypoxemia  EBL: <10 ml  BLOOD ADMINISTERED: none  DRAINS: Urinary Catheter (Foley), Gastrostomy Tube and Trach   SPECIMEN:  No Specimen  COUNTS CORRECT:  YES  PROCEDURE DETAILS: The procedure was performed at the patient's bedside in the [facility] [place] intensive care [facility].  A towel roll was placed underneath the patient's upper shoulders hyperextending her neck. She was prepped and draped in the usual sterile manner.  A proper timeout was performed identifying the patient and the procedure being performed. The area of the incision to be made was marked. We injected 1% Xylocaine with epinephrine transversely approximately 2 cm above the sternal notch. We made a transverse incision using a #15 blade and subsequently used electrocautery to control hemostasis and dissect down to the pretracheal fascia. Blunt dissection was also performed using a hemostat clamp down to the tracheal fascia and pretracheal fascia. Once this was identified we palpated the endotracheal tube as it was pulled back proximally. An angiocatheter with an attached syringe and saline was passed into the hollow lumen of the trachea and air was aspirated.  We removed the needle of the angiocatheter then subsequently using the Seldinger technique passed a green wire into the distal trachea. A short blue stumpy dilator followed by the serial blue rhino dilator were passed over the wire enlarging the tracheotomy. We subsequently used a 26 French  introducer and a #6 tracheostomy tube and passed it over the introducer and wire into the tracheotomy securing it in place. Subsequently we confirmed position using bronchoscopy.  The tracheostomy tube was secured in place with 3-0 Prolene sutures. A drain sponge was placed underneath the flanges of the tracheostomy tube. All needle counts, sponge counts, and instrument counts were correct. Excellent volume return was demonstrated on the ventilator. The patient had very short moments of mild hypoxemia with oxygen saturations into the low 80% area that lasted 20 seconds or less.  PATIENT DISPOSITION:  The patient remained in her bed with the procedure was done at the bedside in the intensive care unit.   [name] 6/7/20176:26 PM

## 2016-01-24 NOTE — Progress Notes (Signed)
Follow up - Trauma and Critical Care  Patient Details:    [name] is an 45 y.o. female.  Lines/tubes : Airway 7.5 mm (Active)  Secured at (cm) 22 cm 01/24/2016  7:35 AM  Measured From Lips 01/24/2016  7:35 AM  Secured Location Center 01/24/2016  7:35 AM  Secured By [employer] 01/24/2016  7:35 AM  Tube Holder Repositioned Yes 01/24/2016  7:35 AM  Cuff Pressure (cm H2O) 25 cm H2O 01/24/2016  7:35 AM  Site Condition Dry 01/24/2016  7:35 AM     CVC Triple Lumen 01/11/16 Left Subclavian (Active)  Indication for Insertion or Continuance of Line Prolonged intravenous therapies 01/24/2016  7:18 AM  Site Assessment Clean;Dry;Intact 01/24/2016  7:18 AM  Proximal Lumen Status Infusing;Flushed 01/24/2016  7:18 AM  Medial Flushed;Saline locked 01/24/2016  7:18 AM  Distal Lumen Status Flushed;Saline locked 01/24/2016  7:18 AM  Dressing Type Transparent 01/24/2016  7:18 AM  Dressing Status Clean;Dry;Intact;Antimicrobial disc in place 01/24/2016  7:18 AM  Line Care Connections checked and tightened 01/24/2016  7:18 AM  Dressing Intervention Dressing changed 01/24/2016 12:00 AM  Dressing Change Due 01/31/16 01/24/2016  7:18 AM     Rectal Tube/Pouch (Active)  Output (mL) 100 mL 01/24/2016  6:00 AM  Intake (mL) 0 mL 01/18/2016  8:00 PM     Urethral Catheter [name], EMT Non-latex;Temperature probe 14 Fr. (Active)  Indication for Insertion or Continuance of Catheter Aggressive IV diuresis 01/23/2016  7:30 PM  Site Assessment Intact;Swelling;Red 01/23/2016  7:30 PM  Catheter Maintenance Bag below level of bladder;Catheter secured;Drainage bag/tubing not touching floor;No dependent loops;Seal intact 01/23/2016  7:30 PM  Collection Container Standard drainage bag 01/23/2016  7:30 PM  Securement Method Securing device (Describe) 01/23/2016  7:30 PM  Urinary Catheter Interventions Unclamped 01/23/2016  7:30 PM  Input (mL) 30 mL 01/13/2016  4:00 AM  Output (mL) 350 mL 01/24/2016  6:00 AM    Microbiology/Sepsis markers: Results  for orders placed or performed during the hospital encounter of 01/06/16  MRSA PCR Screening     Status: None   Collection Time: 01/06/16  1:45 PM  Result Value Ref Range Status   MRSA by PCR NEGATIVE NEGATIVE Final    Comment:        The GeneXpert MRSA Assay (FDA approved for NASAL specimens only), is one component of a comprehensive MRSA colonization surveillance program. It is not intended to diagnose MRSA infection nor to guide or monitor treatment for MRSA infections.   Culture, respiratory (NON-Expectorated)     Status: None   Collection Time: 01/10/16  7:59 PM  Result Value Ref Range Status   Specimen Description TRACHEAL ASPIRATE  Final   Special Requests NONE  Final   Gram Stain   Final    MODERATE WBC PRESENT,BOTH PMN AND MONONUCLEAR NO ORGANISMS SEEN    Culture MODERATE ENTEROBACTER AEROGENES  Final   Report Status 01/13/2016 FINAL  Final   Organism ID, Bacteria ENTEROBACTER AEROGENES  Final      Susceptibility   Enterobacter aerogenes - MIC*    CEFAZOLIN >=64 RESISTANT Resistant     CEFEPIME <=1 SENSITIVE Sensitive     CEFTAZIDIME <=1 SENSITIVE Sensitive     CEFTRIAXONE <=1 SENSITIVE Sensitive     CIPROFLOXACIN <=0.25 SENSITIVE Sensitive     GENTAMICIN <=1 SENSITIVE Sensitive     IMIPENEM 1 SENSITIVE Sensitive     TRIMETH/SULFA <=20 SENSITIVE Sensitive     PIP/TAZO 8 SENSITIVE Sensitive     *  MODERATE ENTEROBACTER AEROGENES  Culture, blood (routine x 2)     Status: Abnormal   Collection Time: 01/11/16  9:57 AM  Result Value Ref Range Status   Specimen Description BLOOD RIGHT ARM  Final   Special Requests IN PEDIATRIC BOTTLE 3CC  Final   Culture  Setup Time   Final    GRAM POSITIVE COCCI IN CLUSTERS AEROBIC BOTTLE ONLY CRITICAL RESULT CALLED TO, READ BACK BY AND VERIFIED WITH: ANDREW MEYER,PHARMD @0744  01/12/16 MKELLY    Culture (A)  Final    STAPHYLOCOCCUS SPECIES (COAGULASE NEGATIVE) SUSCEPTIBILITIES PERFORMED ON PREVIOUS CULTURE WITHIN THE LAST 5  DAYS.    Report Status 01/15/2016 FINAL  Final  Blood Culture ID Panel (Reflexed)     Status: Abnormal   Collection Time: 01/11/16  9:57 AM  Result Value Ref Range Status   Enterococcus species NOT DETECTED NOT DETECTED Final   Vancomycin resistance NOT DETECTED NOT DETECTED Final   Listeria monocytogenes NOT DETECTED NOT DETECTED Final   Staphylococcus species DETECTED (A) NOT DETECTED Final    Comment: CRITICAL RESULT CALLED TO, READ BACK BY AND VERIFIED WITH: ANDREW MEYER,PHARMD @0744  01/12/16 MKELLY    Staphylococcus aureus NOT DETECTED NOT DETECTED Final   Methicillin resistance DETECTED (A) NOT DETECTED Final    Comment: CRITICAL RESULT CALLED TO, READ BACK BY AND VERIFIED WITH: ANDREW MEYER,PHARMD @0744  01/12/16 MKELLY    Streptococcus species NOT DETECTED NOT DETECTED Final   Streptococcus agalactiae NOT DETECTED NOT DETECTED Final   Streptococcus pneumoniae NOT DETECTED NOT DETECTED Final   Streptococcus pyogenes NOT DETECTED NOT DETECTED Final   Acinetobacter baumannii NOT DETECTED NOT DETECTED Final   Enterobacteriaceae species NOT DETECTED NOT DETECTED Final   Enterobacter cloacae complex NOT DETECTED NOT DETECTED Final   Escherichia coli NOT DETECTED NOT DETECTED Final   Klebsiella oxytoca NOT DETECTED NOT DETECTED Final   Klebsiella pneumoniae NOT DETECTED NOT DETECTED Final   Proteus species NOT DETECTED NOT DETECTED Final   Serratia marcescens NOT DETECTED NOT DETECTED Final   Carbapenem resistance NOT DETECTED NOT DETECTED Final   Haemophilus influenzae NOT DETECTED NOT DETECTED Final   Neisseria meningitidis NOT DETECTED NOT DETECTED Final   Pseudomonas aeruginosa NOT DETECTED NOT DETECTED Final   Candida albicans NOT DETECTED NOT DETECTED Final   Candida glabrata NOT DETECTED NOT DETECTED Final   Candida krusei NOT DETECTED NOT DETECTED Final   Candida parapsilosis NOT DETECTED NOT DETECTED Final   Candida tropicalis NOT DETECTED NOT DETECTED Final  Culture,  blood (routine x 2)     Status: Abnormal   Collection Time: 01/11/16 10:00 AM  Result Value Ref Range Status   Specimen Description BLOOD RIGHT HAND  Final   Special Requests IN PEDIATRIC BOTTLE 1.5CC  Final   Culture  Setup Time   Final    GRAM POSITIVE COCCI IN CLUSTERS AEROBIC BOTTLE ONLY CRITICAL RESULT CALLED TO, READ BACK BY AND VERIFIED WITH: ANDREW MEYER,PHARMD @0744  01/12/16 MKELLY    Culture STAPHYLOCOCCUS SPECIES (COAGULASE NEGATIVE) (A)  Final   Report Status 01/15/2016 FINAL  Final   Organism ID, Bacteria STAPHYLOCOCCUS SPECIES (COAGULASE NEGATIVE)  Final      Susceptibility   Staphylococcus species (coagulase negative) - MIC*    CIPROFLOXACIN <=0.5 SENSITIVE Sensitive     ERYTHROMYCIN >=8 RESISTANT Resistant     GENTAMICIN <=0.5 SENSITIVE Sensitive     OXACILLIN >=4 RESISTANT Resistant     TETRACYCLINE >=16 RESISTANT Resistant     VANCOMYCIN 2 SENSITIVE Sensitive  TRIMETH/SULFA <=10 SENSITIVE Sensitive     CLINDAMYCIN <=0.25 RESISTANT Resistant     RIFAMPIN <=0.5 SENSITIVE Sensitive     Inducible Clindamycin POSITIVE Resistant     * STAPHYLOCOCCUS SPECIES (COAGULASE NEGATIVE)  Culture, blood (routine x 2)     Status: None   Collection Time: 01/13/16  2:05 AM  Result Value Ref Range Status   Specimen Description BLOOD BLOOD RIGHT HAND  Final   Special Requests IN PEDIATRIC BOTTLE  1CC  Final   Culture NO GROWTH 5 DAYS  Final   Report Status 01/18/2016 FINAL  Final  Culture, blood (routine x 2)     Status: None   Collection Time: 01/13/16  2:10 AM  Result Value Ref Range Status   Specimen Description BLOOD BLOOD RIGHT HAND  Final   Special Requests IN PEDIATRIC BOTTLE  [facility]  Final   Culture NO GROWTH 5 DAYS  Final   Report Status 01/18/2016 FINAL  Final    Anti-infectives:  Anti-infectives    Start     Dose/Rate Route Frequency Ordered Stop   01/20/16 0815  fluconazole (DIFLUCAN) IVPB 200 mg     200 mg 100 mL/hr over 60 Minutes Intravenous  Once 01/20/16  0804 01/20/16 1000   01/16/16 1600  levofloxacin (LEVAQUIN) IVPB 750 mg     750 mg 100 mL/hr over 90 Minutes Intravenous Every 24 hours 01/16/16 0953 01/27/16 0908   01/13/16 1200  vancomycin (VANCOCIN) 1,250 mg in sodium chloride 0.9 % 250 mL IVPB  Status:  Discontinued     1,250 mg 166.7 mL/hr over 90 Minutes Intravenous Every 8 hours 01/13/16 1109 01/16/16 0953   01/11/16 1800  vancomycin (VANCOCIN) IVPB 1000 mg/200 mL premix  Status:  Discontinued     1,000 mg 200 mL/hr over 60 Minutes Intravenous Every 8 hours 01/11/16 0932 01/13/16 1109   01/11/16 1600  imipenem-cilastatin (PRIMAXIN) 500 mg in sodium chloride 0.9 % 100 mL IVPB  Status:  Discontinued     500 mg 200 mL/hr over 30 Minutes Intravenous Every 6 hours 01/11/16 0941 01/16/16 0953   01/11/16 1000  vancomycin (VANCOCIN) 1,500 mg in sodium chloride 0.9 % 500 mL IVPB     1,500 mg 250 mL/hr over 120 Minutes Intravenous  Once 01/11/16 0932 01/11/16 1247   01/11/16 1000  imipenem-cilastatin (PRIMAXIN) 250 mg in sodium chloride 0.9 % 100 mL IVPB     250 mg 200 mL/hr over 30 Minutes Intravenous  Once 01/11/16 0941 01/11/16 1052      Best Practice/Protocols:  VTE Prophylaxis: Lovenox (prophylaxtic dose) and Mechanical GI Prophylaxis: Proton Pump Inhibitor Intermittent Sedation Continous Sedation  Consults: Treatment Team:  Trauma Md, MD Sheral Apley, MD Hilda Lias, MD    Events:  Subjective:    Overnight Issues: Patietn starting spike a fever.    Objective:  Vital signs for last 24 hours: Temp:  [99.1 F (37.3 C)-101.1 F (38.4 C)] 101.1 F (38.4 C) (06/07 0800) Pulse Rate:  [93-125] 109 (06/07 0800) Resp:  [8-21] 18 (06/07 0800) BP: (100-132)/(54-83) 132/78 mmHg (06/07 0800) SpO2:  [92 %-100 %] 100 % (06/07 0800) FiO2 (%):  [40 %] 40 % (06/07 0800) Weight:  [73 kg (160 lb 15 oz)] 73 kg (160 lb 15 oz) (06/07 0500)  Hemodynamic parameters for last 24 hours:    Intake/Output from previous  day: 06/06 0701 - 06/07 0700 In: 1325.4 [I.V.:305.4; NG/GT:1020] Out: 2755 [Urine:2625; Stool:130]  Intake/Output this shift: Total I/O In: 15 [I.V.:15] Out: -  Vent settings for last 24 hours: Vent Mode:  [-] PRVC FiO2 (%):  [40 %] 40 % Set Rate:  [18 bmp] 18 bmp Vt Set:  [490 mL] 490 mL PEEP:  [5 cmH20] 5 cmH20 Plateau Pressure:  [21 cmH20-23 cmH20] 23 cmH20  Physical Exam:  General: no respiratory distress and no apparent distress Neuro: nonfocal exam, RASS 0, RASS -1 and Opens eyes to verbal stimulus only. Resp: clear to auscultation bilaterally CVS: regular rate and rhythm, S1, S2 normal, no murmur, click, rub or gallop GI: soft, nontender, BS WNL, no r/g and had been tolerating tube feedings well at goal.  NPO since midnight. Extremities: no edema, no erythema, pulses WNL and edema 1+  Results for orders placed or performed during the hospital encounter of 01/06/16 (from the past 24 hour(s))  Glucose, capillary     Status: Abnormal   Collection Time: 01/23/16 11:45 AM  Result Value Ref Range   Glucose-Capillary 148 (H) 65 - 99 mg/dL   Comment 1 Notify RN    Comment 2 Document in Chart   Glucose, capillary     Status: Abnormal   Collection Time: 01/23/16  3:43 PM  Result Value Ref Range   Glucose-Capillary 170 (H) 65 - 99 mg/dL   Comment 1 Notify RN    Comment 2 Document in Chart   Glucose, capillary     Status: Abnormal   Collection Time: 01/23/16  7:46 PM  Result Value Ref Range   Glucose-Capillary 138 (H) 65 - 99 mg/dL  Glucose, capillary     Status: Abnormal   Collection Time: 01/23/16 11:24 PM  Result Value Ref Range   Glucose-Capillary 163 (H) 65 - 99 mg/dL  Glucose, capillary     Status: Abnormal   Collection Time: 01/24/16  7:37 AM  Result Value Ref Range   Glucose-Capillary 109 (H) 65 - 99 mg/dL   Comment 1 Document in Chart      Assessment/Plan:   NEURO  Altered Mental Status:  agitation, change in mental status, delirium and sedation   Plan:  Adjust medications appropriately  PULM  Atelectasis/collapse (focal and mostly LLL)   Plan: Tracheostomy today.  CARDIO  No significant issues   Plan: CPM  RENAL  No significant issue   Plan: CPM.  Urine output good  GI  No specific issues   Plan: Resume TFs tomorrow after PEG placed  ID  Pneumonia (hospital acquired (not ventilator-associated) Being treated for pneumonia appropriately, butnow has a fever of 101.1.  Has old left subclavian central l ine in place)   Plan: PICC line, and remove left subclavian line  HEME  Anemia acute blood loss anemia)   Plan: Stable  ENDO No known issues.   Plan: CPM  Global Issues  Patient for tracheostomy and PEG today.  Will also discontinue her left subclavian line and get PICC for IV access.  Adjust medications as needed.    LOS: 18 days   Additional comments:All tests are pending.  Critical Care Total Time*: 30 Minutes  Jacquie Lukes 01/24/2016  *Care during the described time interval was provided by me and/or other providers on the critical care team.  I have reviewed this patient's available data, including medical history, events of note, physical examination and test results as part of my evaluation.

## 2016-01-24 NOTE — Progress Notes (Signed)
Peripherally Inserted Central Catheter/Midline Placement  The IV Nurse has discussed with the patient and/or persons authorized to consent for the patient, the purpose of this procedure and the potential benefits and risks involved with this procedure.  The benefits include less needle sticks, lab draws from the catheter and patient may be discharged home with the catheter.  Risks include, but not limited to, infection, bleeding, blood clot (thrombus formation), and puncture of an artery; nerve damage and irregular heat beat.  Alternatives to this procedure were also discussed.  PICC/Midline Placement Documentation    Information given to Brunswick Corporationmother,Mary Williamson .    Franne Gripewman, Sami Froh Renee 01/24/2016, 10:27 AM

## 2016-01-25 ENCOUNTER — Encounter (HOSPITAL_COMMUNITY): Payer: Self-pay | Admitting: General Surgery

## 2016-01-25 LAB — BASIC METABOLIC PANEL
ANION GAP: 6 (ref 5–15)
BUN: 25 mg/dL — ABNORMAL HIGH (ref 6–20)
CALCIUM: 8.9 mg/dL (ref 8.9–10.3)
CO2: 28 mmol/L (ref 22–32)
CREATININE: 0.6 mg/dL (ref 0.44–1.00)
Chloride: 108 mmol/L (ref 101–111)
GLUCOSE: 104 mg/dL — AB (ref 65–99)
Potassium: 3.5 mmol/L (ref 3.5–5.1)
Sodium: 142 mmol/L (ref 135–145)

## 2016-01-25 LAB — CBC WITH DIFFERENTIAL/PLATELET
BASOS ABS: 0 10*3/uL (ref 0.0–0.1)
BASOS PCT: 0 %
EOS ABS: 0.3 10*3/uL (ref 0.0–0.7)
Eosinophils Relative: 3 %
HCT: 29.6 % — ABNORMAL LOW (ref 36.0–46.0)
Hemoglobin: 8.9 g/dL — ABNORMAL LOW (ref 12.0–15.0)
Lymphocytes Relative: 20 %
Lymphs Abs: 1.9 10*3/uL (ref 0.7–4.0)
MCH: 29.1 pg (ref 26.0–34.0)
MCHC: 30.1 g/dL (ref 30.0–36.0)
MCV: 96.7 fL (ref 78.0–100.0)
MONO ABS: 0.6 10*3/uL (ref 0.1–1.0)
MONOS PCT: 6 %
NEUTROS ABS: 6.4 10*3/uL (ref 1.7–7.7)
NEUTROS PCT: 71 %
Platelets: 227 10*3/uL (ref 150–400)
RBC: 3.06 MIL/uL — ABNORMAL LOW (ref 3.87–5.11)
RDW: 16.2 % — AB (ref 11.5–15.5)
WBC: 9.1 10*3/uL (ref 4.0–10.5)

## 2016-01-25 LAB — GLUCOSE, CAPILLARY
GLUCOSE-CAPILLARY: 106 mg/dL — AB (ref 65–99)
GLUCOSE-CAPILLARY: 107 mg/dL — AB (ref 65–99)
GLUCOSE-CAPILLARY: 125 mg/dL — AB (ref 65–99)
GLUCOSE-CAPILLARY: 144 mg/dL — AB (ref 65–99)
GLUCOSE-CAPILLARY: 166 mg/dL — AB (ref 65–99)
GLUCOSE-CAPILLARY: 182 mg/dL — AB (ref 65–99)

## 2016-01-25 MED ORDER — AMANTADINE HCL 50 MG/5ML PO SYRP
100.0000 mg | ORAL_SOLUTION | Freq: Two times a day (BID) | ORAL | Status: DC
  Administered 2016-01-25 – 2016-02-05 (×23): 100 mg
  Filled 2016-01-25 (×26): qty 10

## 2016-01-25 MED ORDER — CLONAZEPAM 1 MG PO TABS
2.0000 mg | ORAL_TABLET | Freq: Two times a day (BID) | ORAL | Status: DC
  Administered 2016-01-25: 2 mg
  Filled 2016-01-25: qty 2

## 2016-01-25 MED ORDER — SACCHAROMYCES BOULARDII 250 MG PO CAPS
250.0000 mg | ORAL_CAPSULE | Freq: Two times a day (BID) | ORAL | Status: DC
  Administered 2016-01-25 – 2016-02-05 (×23): 250 mg via ORAL
  Filled 2016-01-25 (×23): qty 1

## 2016-01-25 NOTE — Progress Notes (Signed)
Patient ID: [name], female   DOB: 10/12/1970, 45 y.o.   MRN: [id number] Neuro  Moves all 4 extremities. Overall unchanged
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
## 2016-01-26 LAB — GLUCOSE, CAPILLARY
GLUCOSE-CAPILLARY: 147 mg/dL — AB (ref 65–99)
GLUCOSE-CAPILLARY: 149 mg/dL — AB (ref 65–99)
GLUCOSE-CAPILLARY: 125 mg/dL — AB (ref 65–99)
Glucose-Capillary: 120 mg/dL — ABNORMAL HIGH (ref 65–99)
Glucose-Capillary: 122 mg/dL — ABNORMAL HIGH (ref 65–99)
Glucose-Capillary: 122 mg/dL — ABNORMAL HIGH (ref 65–99)

## 2016-01-26 MED ORDER — ERYTHROMYCIN 5 MG/GM OP OINT
TOPICAL_OINTMENT | Freq: Four times a day (QID) | OPHTHALMIC | Status: DC
  Administered 2016-01-26 (×2): via OPHTHALMIC
  Administered 2016-01-26: 1 via OPHTHALMIC
  Administered 2016-01-27: 05:00:00 via OPHTHALMIC
  Filled 2016-01-26: qty 3.5

## 2016-01-26 MED ORDER — CLONAZEPAM 1 MG PO TABS
1.0000 mg | ORAL_TABLET | Freq: Three times a day (TID) | ORAL | Status: DC
  Administered 2016-01-26 – 2016-01-29 (×9): 1 mg
  Filled 2016-01-26 (×9): qty 1
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
## 2016-01-26 NOTE — Progress Notes (Signed)
Inpatient Rehabilitation  Patient was screened by Fae PippinMelissa Josean Lycan for appropriateness for an Inpatient Acute Rehab consult.  At this time we are recommending an Inpatient Rehab consult once patient is weaned or trached.  Will follow along at a distance for timing of consult.  Please call with questions.      Charlane FerrettiMelissa Alexandre Lightsey, M.A., CCC/SLP Admission Coordinator  [facility]  Cell [phone]
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
## 2016-01-27 LAB — GLUCOSE, CAPILLARY
GLUCOSE-CAPILLARY: 118 mg/dL — AB (ref 65–99)
GLUCOSE-CAPILLARY: 130 mg/dL — AB (ref 65–99)
Glucose-Capillary: 131 mg/dL — ABNORMAL HIGH (ref 65–99)
Glucose-Capillary: 110 mg/dL — ABNORMAL HIGH (ref 65–99)

## 2016-01-27 MED ORDER — PNEUMOCOCCAL VAC POLYVALENT 25 MCG/0.5ML IJ INJ
0.5000 mL | INJECTION | INTRAMUSCULAR | Status: AC
  Administered 2016-01-28: 0.5 mL via INTRAMUSCULAR
  Filled 2016-01-27: qty 0.5

## 2016-01-27 MED ORDER — ERYTHROMYCIN 5 MG/GM OP OINT
TOPICAL_OINTMENT | Freq: Four times a day (QID) | OPHTHALMIC | Status: DC
  Administered 2016-01-27: 1 via OPHTHALMIC
  Administered 2016-01-27 – 2016-01-28 (×3): via OPHTHALMIC
  Administered 2016-01-28 (×2): 1 via OPHTHALMIC
  Administered 2016-01-29: 17:00:00 via OPHTHALMIC
  Administered 2016-01-29: 1 via OPHTHALMIC
  Administered 2016-01-29 (×2): via OPHTHALMIC
  Administered 2016-01-30 (×3): 1 via OPHTHALMIC
  Administered 2016-01-30 – 2016-01-31 (×5): via OPHTHALMIC
  Administered 2016-01-31 – 2016-02-01 (×2): 1 via OPHTHALMIC
  Administered 2016-02-01: 18:00:00 via OPHTHALMIC
  Administered 2016-02-01: 1 via OPHTHALMIC
  Administered 2016-02-01: 05:00:00 via OPHTHALMIC
  Administered 2016-02-02: 1 via OPHTHALMIC
  Administered 2016-02-02 (×2): via OPHTHALMIC
  Administered 2016-02-03 (×2): 1 via OPHTHALMIC
  Administered 2016-02-03 – 2016-02-05 (×7): via OPHTHALMIC
  Filled 2016-01-27: qty 3.5

## 2016-01-27 NOTE — Progress Notes (Signed)
Patient ID: [name], female   DOB: 07/27/1971, 45 y.o.   MRN: [id number] BP 107/58 mmHg  Pulse 102  Temp(Src) 98.8 F (37.1 C) (Axillary)  Resp 18  Ht 5\' 7"  (1.702 m)  Wt 75.1 kg (165 lb 9.1 oz)  BMI 25.93 kg/m2  SpO2 98%  LMP  (LMP Unknown) Alert, does not follow commands Perrl,  Moving all extremities spontaneously Collar in place No neurologic changes.
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
## 2016-01-28 LAB — CBC
HEMATOCRIT: 33 % — AB (ref 36.0–46.0)
HEMOGLOBIN: 9.9 g/dL — AB (ref 12.0–15.0)
MCH: 28.3 pg (ref 26.0–34.0)
MCHC: 30 g/dL (ref 30.0–36.0)
MCV: 94.3 fL (ref 78.0–100.0)
Platelets: 175 10*3/uL (ref 150–400)
RBC: 3.5 MIL/uL — ABNORMAL LOW (ref 3.87–5.11)
RDW: 14.9 % (ref 11.5–15.5)
WBC: 8.2 10*3/uL (ref 4.0–10.5)

## 2016-01-28 LAB — GLUCOSE, CAPILLARY
GLUCOSE-CAPILLARY: 125 mg/dL — AB (ref 65–99)
Glucose-Capillary: 131 mg/dL — ABNORMAL HIGH (ref 65–99)
Glucose-Capillary: 132 mg/dL — ABNORMAL HIGH (ref 65–99)
Glucose-Capillary: 100 mg/dL — ABNORMAL HIGH (ref 65–99)

## 2016-01-28 MED ORDER — HYDROCODONE-ACETAMINOPHEN 7.5-325 MG/15ML PO SOLN
15.0000 mL | ORAL | Status: DC | PRN
  Administered 2016-01-28 – 2016-01-29 (×2): 15 mL via ORAL
  Filled 2016-01-28 (×2): qty 15

## 2016-01-28 MED ORDER — QUETIAPINE FUMARATE 200 MG PO TABS
200.0000 mg | ORAL_TABLET | Freq: Two times a day (BID) | ORAL | Status: DC
  Administered 2016-01-28 (×2): 200 mg
  Filled 2016-01-28 (×2): qty 1

## 2016-01-28 MED ORDER — LORAZEPAM 2 MG/ML IJ SOLN
2.0000 mg | Freq: Once | INTRAMUSCULAR | Status: AC
  Administered 2016-01-28: 2 mg via INTRAVENOUS

## 2016-01-28 MED ORDER — FENTANYL CITRATE (PF) 100 MCG/2ML IJ SOLN: 50.0000 ug | INTRAMUSCULAR | Status: DC | PRN

## 2016-01-28 MED ORDER — FENTANYL BOLUS VIA INFUSION
50.0000 ug | INTRAVENOUS | Status: DC | PRN
  Filled 2016-01-28: qty 50

## 2016-01-28 MED ORDER — LORAZEPAM 2 MG/ML IJ SOLN
INTRAMUSCULAR | Status: AC
  Filled 2016-01-28: qty 1

## 2016-01-28 MED ORDER — FENTANYL CITRATE (PF) 100 MCG/2ML IJ SOLN
50.0000 ug | INTRAMUSCULAR | Status: DC | PRN
  Administered 2016-01-28 – 2016-01-29 (×3): 100 ug via INTRAVENOUS
  Filled 2016-01-28 (×3): qty 2

## 2016-01-28 NOTE — Progress Notes (Signed)
Fentanyl gtt stopped per MD order. 200 ml of Fentanyl, at a concentration of 2500 mcg in 250 ml, was wasted in sink and witnessed by Tammy B., RN.
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
## 2016-01-28 NOTE — Progress Notes (Signed)
Patient ID: [name], female   DOB: 12/20/1970, 45 y.o.   MRN: [id number] BP 114/60 mmHg  Pulse 96  Temp(Src) 100.5 F (38.1 C) (Axillary)  Resp 13  Ht 5\' 7"  (1.702 m)  Wt 74.4 kg (164 lb 0.4 oz)  BMI 25.68 kg/m2  SpO2 93%  LMP  (LMP Unknown) Alert, will follow some commands Collar in place. Moving all extremities Continue with current care.

## 2016-01-29 LAB — GLUCOSE, CAPILLARY
GLUCOSE-CAPILLARY: 100 mg/dL — AB (ref 65–99)
GLUCOSE-CAPILLARY: 106 mg/dL — AB (ref 65–99)
GLUCOSE-CAPILLARY: 126 mg/dL — AB (ref 65–99)
GLUCOSE-CAPILLARY: 128 mg/dL — AB (ref 65–99)
GLUCOSE-CAPILLARY: 129 mg/dL — AB (ref 65–99)
GLUCOSE-CAPILLARY: 150 mg/dL — AB (ref 65–99)
Glucose-Capillary: 146 mg/dL — ABNORMAL HIGH (ref 65–99)
Glucose-Capillary: 140 mg/dL — ABNORMAL HIGH (ref 65–99)
Glucose-Capillary: 130 mg/dL — ABNORMAL HIGH (ref 65–99)

## 2016-01-29 MED ORDER — LORAZEPAM 2 MG/ML IJ SOLN
2.0000 mg | Freq: Once | INTRAMUSCULAR | Status: AC
  Administered 2016-01-29: 2 mg via INTRAVENOUS

## 2016-01-29 MED ORDER — QUETIAPINE FUMARATE 100 MG PO TABS
200.0000 mg | ORAL_TABLET | Freq: Three times a day (TID) | ORAL | Status: DC
Start: 2016-01-29 — End: 2016-02-05
  Administered 2016-01-29 – 2016-02-05 (×22): 200 mg
  Filled 2016-01-29: qty 2
  Filled 2016-01-29: qty 4
  Filled 2016-01-29 (×2): qty 2
  Filled 2016-01-29: qty 4
  Filled 2016-01-29 (×2): qty 2
  Filled 2016-01-29: qty 1
  Filled 2016-01-29: qty 2
  Filled 2016-01-29: qty 4
  Filled 2016-01-29: qty 2
  Filled 2016-01-29 (×2): qty 1
  Filled 2016-01-29: qty 4
  Filled 2016-01-29 (×2): qty 2
  Filled 2016-01-29 (×2): qty 4
  Filled 2016-01-29 (×2): qty 2
  Filled 2016-01-29 (×2): qty 4
  Filled 2016-01-29: qty 2
  Filled 2016-01-29: qty 8
  Filled 2016-01-29: qty 4

## 2016-01-29 MED ORDER — CLONAZEPAM 1 MG PO TABS
1.0000 mg | ORAL_TABLET | Freq: Two times a day (BID) | ORAL | Status: DC
  Administered 2016-01-29 (×2): 1 mg
  Filled 2016-01-29: qty 1

## 2016-01-29 MED ORDER — ALBUTEROL SULFATE (2.5 MG/3ML) 0.083% IN NEBU
2.5000 mg | INHALATION_SOLUTION | RESPIRATORY_TRACT | Status: DC | PRN
  Administered 2016-01-30 – 2016-02-02 (×3): 2.5 mg via RESPIRATORY_TRACT
  Filled 2016-01-29: qty 3

## 2016-01-29 MED ORDER — MORPHINE SULFATE (PF) 2 MG/ML IV SOLN
2.0000 mg | INTRAVENOUS | Status: DC | PRN
  Administered 2016-01-29 – 2016-02-03 (×15): 2 mg via INTRAVENOUS
  Filled 2016-01-29 (×15): qty 1

## 2016-01-29 MED ORDER — LORAZEPAM 2 MG/ML IJ SOLN
INTRAMUSCULAR | Status: AC
  Filled 2016-01-29: qty 1

## 2016-01-29 MED ORDER — IPRATROPIUM-ALBUTEROL 0.5-2.5 (3) MG/3ML IN SOLN
3.0000 mL | Freq: Three times a day (TID) | RESPIRATORY_TRACT | Status: DC
  Administered 2016-01-29 – 2016-01-31 (×4): 3 mL via RESPIRATORY_TRACT
  Filled 2016-01-29 (×5): qty 3

## 2016-01-29 MED ORDER — CLONAZEPAM 1 MG PO TABS
1.0000 mg | ORAL_TABLET | Freq: Two times a day (BID) | ORAL | Status: DC
  Filled 2016-01-29: qty 1

## 2016-01-29 MED ORDER — HYDROCODONE-ACETAMINOPHEN 7.5-325 MG/15ML PO SOLN
10.0000 mL | ORAL | Status: DC | PRN
  Administered 2016-01-29 (×2): 20 mL via ORAL
  Administered 2016-01-30 (×2): 15 mL via ORAL
  Administered 2016-01-30 (×2): 20 mL via ORAL
  Administered 2016-01-31 – 2016-02-01 (×5): 15 mL via ORAL
  Administered 2016-02-01: 20 mL via ORAL
  Administered 2016-02-02 (×2): 15 mL via ORAL
  Administered 2016-02-02: 20 mL via ORAL
  Administered 2016-02-03 (×3): 15 mL via ORAL
  Administered 2016-02-03: 20 mL via ORAL
  Administered 2016-02-04 – 2016-02-05 (×4): 15 mL via ORAL
  Filled 2016-01-29: qty 15
  Filled 2016-01-29 (×2): qty 30
  Filled 2016-01-29 (×2): qty 15
  Filled 2016-01-29 (×2): qty 30
  Filled 2016-01-29: qty 15
  Filled 2016-01-29: qty 30
  Filled 2016-01-29: qty 15
  Filled 2016-01-29: qty 30
  Filled 2016-01-29: qty 15
  Filled 2016-01-29: qty 30
  Filled 2016-01-29 (×10): qty 15
  Filled 2016-01-29: qty 30

## 2016-01-29 MED ORDER — ANTISEPTIC ORAL RINSE SOLUTION (CORINZ)
7.0000 mL | Freq: Four times a day (QID) | OROMUCOSAL | Status: DC
  Administered 2016-01-29 (×2): 7 mL via OROMUCOSAL
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
## 2016-01-29 NOTE — Progress Notes (Signed)
Dr. Janee Mornhompson made aware that patient has increased agitation again, despite reorienting, repositioning, and pain medication. New orders given and carried out. Will continue to monitor. Dicie BeamFrazier, Luccia Reinheimer RN BSN.
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
## 2016-01-29 NOTE — Progress Notes (Signed)
Dr. Janee Mornhompson notified of patients increased agitation, pulling at tubes, and lines. New order for ativan and posey belt  Restraint ordered. Will continue to monitor. Dicie BeamFrazier, Eldred Sooy RN BSN

## 2016-01-30 DIAGNOSIS — J189 Pneumonia, unspecified organism: Secondary | ICD-10-CM | POA: Diagnosis not present

## 2016-01-30 DIAGNOSIS — D62 Acute posthemorrhagic anemia: Secondary | ICD-10-CM

## 2016-01-30 DIAGNOSIS — A0472 Enterocolitis due to Clostridium difficile, not specified as recurrent: Secondary | ICD-10-CM | POA: Diagnosis not present

## 2016-01-30 DIAGNOSIS — S129XXA Fracture of neck, unspecified, initial encounter: Secondary | ICD-10-CM | POA: Diagnosis present

## 2016-01-30 DIAGNOSIS — I6200 Nontraumatic subdural hemorrhage, unspecified: Secondary | ICD-10-CM

## 2016-01-30 DIAGNOSIS — S32401A Unspecified fracture of right acetabulum, initial encounter for closed fracture: Secondary | ICD-10-CM | POA: Diagnosis present

## 2016-01-30 DIAGNOSIS — S0101XA Laceration without foreign body of scalp, initial encounter: Secondary | ICD-10-CM | POA: Diagnosis present

## 2016-01-30 DIAGNOSIS — S270XXD Traumatic pneumothorax, subsequent encounter: Secondary | ICD-10-CM

## 2016-01-30 DIAGNOSIS — S2242XA Multiple fractures of ribs, left side, initial encounter for closed fracture: Secondary | ICD-10-CM | POA: Diagnosis present

## 2016-01-30 DIAGNOSIS — S065X9A Traumatic subdural hemorrhage with loss of consciousness of unspecified duration, initial encounter: Secondary | ICD-10-CM | POA: Insufficient documentation

## 2016-01-30 DIAGNOSIS — S270XXA Traumatic pneumothorax, initial encounter: Secondary | ICD-10-CM | POA: Diagnosis present

## 2016-01-30 DIAGNOSIS — J939 Pneumothorax, unspecified: Secondary | ICD-10-CM | POA: Insufficient documentation

## 2016-01-30 DIAGNOSIS — H109 Unspecified conjunctivitis: Secondary | ICD-10-CM | POA: Diagnosis not present

## 2016-01-30 DIAGNOSIS — S22009D Unspecified fracture of unspecified thoracic vertebra, subsequent encounter for fracture with routine healing: Secondary | ICD-10-CM

## 2016-01-30 DIAGNOSIS — T148 Other injury of unspecified body region: Secondary | ICD-10-CM

## 2016-01-30 DIAGNOSIS — S22009A Unspecified fracture of unspecified thoracic vertebra, initial encounter for closed fracture: Secondary | ICD-10-CM | POA: Diagnosis present

## 2016-01-30 DIAGNOSIS — S32501G Unspecified fracture of right pubis, subsequent encounter for fracture with delayed healing: Secondary | ICD-10-CM

## 2016-01-30 DIAGNOSIS — S2242XD Multiple fractures of ribs, left side, subsequent encounter for fracture with routine healing: Secondary | ICD-10-CM

## 2016-01-30 DIAGNOSIS — S129XXD Fracture of neck, unspecified, subsequent encounter: Secondary | ICD-10-CM

## 2016-01-30 DIAGNOSIS — S134XXD Sprain of ligaments of cervical spine, subsequent encounter: Secondary | ICD-10-CM

## 2016-01-30 DIAGNOSIS — A047 Enterocolitis due to Clostridium difficile: Secondary | ICD-10-CM

## 2016-01-30 DIAGNOSIS — S069X9D Unspecified intracranial injury with loss of consciousness of unspecified duration, subsequent encounter: Secondary | ICD-10-CM

## 2016-01-30 DIAGNOSIS — J9600 Acute respiratory failure, unspecified whether with hypoxia or hypercapnia: Secondary | ICD-10-CM | POA: Insufficient documentation

## 2016-01-30 DIAGNOSIS — T07XXXA Unspecified multiple injuries, initial encounter: Secondary | ICD-10-CM

## 2016-01-30 DIAGNOSIS — Z930 Tracheostomy status: Secondary | ICD-10-CM

## 2016-01-30 DIAGNOSIS — S065XAA Traumatic subdural hemorrhage with loss of consciousness status unknown, initial encounter: Secondary | ICD-10-CM | POA: Insufficient documentation

## 2016-01-30 DIAGNOSIS — S3210XA Unspecified fracture of sacrum, initial encounter for closed fracture: Secondary | ICD-10-CM | POA: Diagnosis present

## 2016-01-30 LAB — GLUCOSE, CAPILLARY
GLUCOSE-CAPILLARY: 132 mg/dL — AB (ref 65–99)
Glucose-Capillary: 117 mg/dL — ABNORMAL HIGH (ref 65–99)
Glucose-Capillary: 125 mg/dL — ABNORMAL HIGH (ref 65–99)
Glucose-Capillary: 154 mg/dL — ABNORMAL HIGH (ref 65–99)
Glucose-Capillary: 116 mg/dL — ABNORMAL HIGH (ref 65–99)
Glucose-Capillary: 134 mg/dL — ABNORMAL HIGH (ref 65–99)

## 2016-01-30 MED ORDER — CHLORHEXIDINE GLUCONATE 0.12 % MT SOLN: 15.0000 mL | Freq: Two times a day (BID) | OROMUCOSAL | Status: DC

## 2016-01-30 MED ORDER — CHLORHEXIDINE GLUCONATE 0.12 % MT SOLN
15.0000 mL | Freq: Two times a day (BID) | OROMUCOSAL | Status: DC
Start: 2016-01-30 — End: 2016-02-05
  Administered 2016-01-30 – 2016-02-05 (×13): 15 mL via OROMUCOSAL
  Filled 2016-01-30 (×12): qty 15

## 2016-01-30 MED ORDER — CETYLPYRIDINIUM CHLORIDE 0.05 % MT LIQD
7.0000 mL | Freq: Two times a day (BID) | OROMUCOSAL | Status: DC
  Administered 2016-01-30 – 2016-02-05 (×12): 7 mL via OROMUCOSAL

## 2016-01-30 MED ORDER — CETYLPYRIDINIUM CHLORIDE 0.05 % MT LIQD: 7.0000 mL | Freq: Two times a day (BID) | OROMUCOSAL | Status: DC

## 2016-01-30 MED ORDER — CLONAZEPAM 0.5 MG PO TABS
0.5000 mg | ORAL_TABLET | Freq: Three times a day (TID) | ORAL | Status: DC
  Administered 2016-01-30 – 2016-02-01 (×9): 0.5 mg
  Filled 2016-01-30 (×10): qty 1
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
## 2016-01-30 NOTE — Progress Notes (Signed)
SLP Cancellation Note  Patient Details Name: [name] MRN: [id number] DOB: 01/01/1971   Cancelled treatment:        Pt sleeping and has gotten very little sleep. Will defer tx today to allow pt to rest for increase in ability to participate in the future. Check next date.   Royce MacadamiaLitaker, Terren Haberle Willis 01/30/2016, 2:19 PM   Breck CoonsLisa Willis Lonell FaceLitaker M.Ed [employer] Pager [phone]

## 2016-01-30 NOTE — Progress Notes (Signed)
Chest tube sutures removed, area left open to air.

## 2016-01-31 LAB — GLUCOSE, CAPILLARY
GLUCOSE-CAPILLARY: 143 mg/dL — AB (ref 65–99)
GLUCOSE-CAPILLARY: 156 mg/dL — AB (ref 65–99)
Glucose-Capillary: 102 mg/dL — ABNORMAL HIGH (ref 65–99)
Glucose-Capillary: 142 mg/dL — ABNORMAL HIGH (ref 65–99)
Glucose-Capillary: 146 mg/dL — ABNORMAL HIGH (ref 65–99)
Glucose-Capillary: 146 mg/dL — ABNORMAL HIGH (ref 65–99)

## 2016-01-31 MED ORDER — ALTEPLASE 2 MG IJ SOLR: 2.0000 mg | Freq: Once | INTRAMUSCULAR | Status: DC

## 2016-01-31 MED ORDER — IPRATROPIUM-ALBUTEROL 0.5-2.5 (3) MG/3ML IN SOLN: 3.0000 mL | Freq: Four times a day (QID) | RESPIRATORY_TRACT | Status: DC | PRN
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
## 2016-01-31 NOTE — Progress Notes (Signed)
Admissions coordinator to follow up.  Note pt. now Rancho IV to V with agitation and requiring Recruitment consultantsafety sitter.    Weldon PickingSusan Zyler Hyson PT Inpatient Rehab Admissions Coordinator Cell [phone] Office [phone]

## 2016-01-31 NOTE — Progress Notes (Signed)
RT Note: Patient has been maintaining all day on her aerosol trach collar well with no issues. RT will continue to maintain and monitor patient.
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
## 2016-02-01 LAB — GLUCOSE, CAPILLARY
GLUCOSE-CAPILLARY: 142 mg/dL — AB (ref 65–99)
Glucose-Capillary: 106 mg/dL — ABNORMAL HIGH (ref 65–99)
Glucose-Capillary: 120 mg/dL — ABNORMAL HIGH (ref 65–99)
Glucose-Capillary: 134 mg/dL — ABNORMAL HIGH (ref 65–99)
Glucose-Capillary: 142 mg/dL — ABNORMAL HIGH (ref 65–99)
Glucose-Capillary: 163 mg/dL — ABNORMAL HIGH (ref 65–99)

## 2016-02-01 MED ORDER — PRAMIPEXOLE DIHYDROCHLORIDE 0.25 MG PO TABS
0.2500 mg | ORAL_TABLET | Freq: Three times a day (TID) | ORAL | Status: DC
  Administered 2016-02-01 – 2016-02-05 (×13): 0.25 mg via ORAL
  Filled 2016-02-01 (×16): qty 1

## 2016-02-01 NOTE — Progress Notes (Signed)
Rehab admissions - Rounded on patient today.  She had a sitter this am and mittens on, a bit restless in bed.  I called her mother.  Patient does not live with her mother.  Patient has gone from place to place per mom for the past year or more.  Mom says she would like to take patient home after rehab, but she is limited in what she can do.  Mom does not work, but mom cannot give physical assistance.  Patient has no insurance and Mom/Daughter have applied for medicaid and disability for patient.  Daughter is 44 yo, works, and has a 5 yo child.  Son works Pharmacist, [place].  Anticipate very minimal support after a potential rehab stay and suspect that patient will need SNF after a potential inpatient rehab stay.  I will have my partner follow up further tomorrow.  #[phone]
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
## 2016-02-02 LAB — GLUCOSE, CAPILLARY
GLUCOSE-CAPILLARY: 132 mg/dL — AB (ref 65–99)
GLUCOSE-CAPILLARY: 137 mg/dL — AB (ref 65–99)
GLUCOSE-CAPILLARY: 138 mg/dL — AB (ref 65–99)
GLUCOSE-CAPILLARY: 156 mg/dL — AB (ref 65–99)
Glucose-Capillary: 114 mg/dL — ABNORMAL HIGH (ref 65–99)
Glucose-Capillary: 119 mg/dL — ABNORMAL HIGH (ref 65–99)

## 2016-02-02 MED ORDER — CLONAZEPAM 0.5 MG PO TABS
0.5000 mg | ORAL_TABLET | Freq: Two times a day (BID) | ORAL | Status: DC
  Administered 2016-02-02 – 2016-02-04 (×5): 0.5 mg
  Filled 2016-02-02 (×5): qty 1

## 2016-02-02 MED ORDER — HALOPERIDOL LACTATE 5 MG/ML IJ SOLN
5.0000 mg | Freq: Four times a day (QID) | INTRAMUSCULAR | Status: DC | PRN
  Administered 2016-02-02 – 2016-02-05 (×5): 5 mg via INTRAMUSCULAR
  Filled 2016-02-02 (×6): qty 1
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
## 2016-02-02 NOTE — Progress Notes (Signed)
Inpatient Rehabilitation  I visited pt. In her room briefly.  She mouthed several short responses to my questions.  I have discussed with pt's RN Rakita that in order for pt. to be ready for CIR, pt. will need to be up in recliner ideally BID for 1-1/2 hours at a time.  Kenyon AnaRakita says she will initiate increasing sitting activity tolerance by having pt. OOB in chair.  Pt. still with sitter and in mittens.   Have updated Macario GoldsJesse Scinto, CSW.  Will follow up on Monday.    Weldon PickingSusan Anas Reister PT Inpatient Rehab Admissions Coordinator Cell [phone] Office [phone]

## 2016-02-03 LAB — GLUCOSE, CAPILLARY
GLUCOSE-CAPILLARY: 119 mg/dL — AB (ref 65–99)
Glucose-Capillary: 120 mg/dL — ABNORMAL HIGH (ref 65–99)
Glucose-Capillary: 133 mg/dL — ABNORMAL HIGH (ref 65–99)
Glucose-Capillary: 134 mg/dL — ABNORMAL HIGH (ref 65–99)
Glucose-Capillary: 140 mg/dL — ABNORMAL HIGH (ref 65–99)
Glucose-Capillary: 141 mg/dL — ABNORMAL HIGH (ref 65–99)

## 2016-02-03 NOTE — Progress Notes (Signed)
Placed on Enteric per protocol for the rest of the hospitalization for positive CDiff.
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
## 2016-02-04 LAB — GLUCOSE, CAPILLARY
GLUCOSE-CAPILLARY: 133 mg/dL — AB (ref 65–99)
GLUCOSE-CAPILLARY: 142 mg/dL — AB (ref 65–99)
GLUCOSE-CAPILLARY: 120 mg/dL — AB (ref 65–99)
GLUCOSE-CAPILLARY: 133 mg/dL — AB (ref 65–99)
Glucose-Capillary: 137 mg/dL — ABNORMAL HIGH (ref 65–99)
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
## 2016-02-04 NOTE — Progress Notes (Signed)
Patient placed in chair at 1030. Patient is a two person assist.

## 2016-02-04 NOTE — Progress Notes (Signed)
Patient sat up in chair from 1030 in the morning until 3pm in the afternoon. Patient appears more comfortable and less restless when up to the chair.

## 2016-02-05 ENCOUNTER — Encounter (HOSPITAL_COMMUNITY): Payer: Self-pay | Admitting: *Deleted

## 2016-02-05 ENCOUNTER — Inpatient Hospital Stay (HOSPITAL_COMMUNITY)
Admission: RE | Admit: 2016-02-05 | Discharge: 2016-02-19 | DRG: 949 | Disposition: A | Discharge status: Home Health | Payer: Medicaid Other | Source: Intra-hospital | Attending: Physical Medicine & Rehabilitation | Admitting: Physical Medicine & Rehabilitation

## 2016-02-05 DIAGNOSIS — R000 Tachycardia, unspecified: Secondary | ICD-10-CM | POA: Diagnosis present

## 2016-02-05 DIAGNOSIS — F1010 Alcohol abuse, uncomplicated: Secondary | ICD-10-CM | POA: Insufficient documentation

## 2016-02-05 DIAGNOSIS — S3210XD Unspecified fracture of sacrum, subsequent encounter for fracture with routine healing: Secondary | ICD-10-CM

## 2016-02-05 DIAGNOSIS — D62 Acute posthemorrhagic anemia: Secondary | ICD-10-CM | POA: Diagnosis present

## 2016-02-05 DIAGNOSIS — Z9189 Other specified personal risk factors, not elsewhere classified: Secondary | ICD-10-CM | POA: Insufficient documentation

## 2016-02-05 DIAGNOSIS — S270XXD Traumatic pneumothorax, subsequent encounter: Secondary | ICD-10-CM

## 2016-02-05 DIAGNOSIS — S2249XD Multiple fractures of ribs, unspecified side, subsequent encounter for fracture with routine healing: Secondary | ICD-10-CM | POA: Diagnosis not present

## 2016-02-05 DIAGNOSIS — F319 Bipolar disorder, unspecified: Secondary | ICD-10-CM | POA: Diagnosis present

## 2016-02-05 DIAGNOSIS — S3210XA Unspecified fracture of sacrum, initial encounter for closed fracture: Secondary | ICD-10-CM | POA: Insufficient documentation

## 2016-02-05 DIAGNOSIS — A047 Enterocolitis due to Clostridium difficile: Secondary | ICD-10-CM | POA: Diagnosis present

## 2016-02-05 DIAGNOSIS — S32591D Other specified fracture of right pubis, subsequent encounter for fracture with routine healing: Secondary | ICD-10-CM | POA: Diagnosis not present

## 2016-02-05 DIAGNOSIS — S32501S Unspecified fracture of right pubis, sequela: Secondary | ICD-10-CM

## 2016-02-05 DIAGNOSIS — F1410 Cocaine abuse, uncomplicated: Secondary | ICD-10-CM | POA: Diagnosis present

## 2016-02-05 DIAGNOSIS — S2242XS Multiple fractures of ribs, left side, sequela: Secondary | ICD-10-CM

## 2016-02-05 DIAGNOSIS — S134XXS Sprain of ligaments of cervical spine, sequela: Secondary | ICD-10-CM

## 2016-02-05 DIAGNOSIS — R451 Restlessness and agitation: Secondary | ICD-10-CM

## 2016-02-05 DIAGNOSIS — F4321 Adjustment disorder with depressed mood: Secondary | ICD-10-CM | POA: Diagnosis present

## 2016-02-05 DIAGNOSIS — F419 Anxiety disorder, unspecified: Secondary | ICD-10-CM | POA: Insufficient documentation

## 2016-02-05 DIAGNOSIS — R1310 Dysphagia, unspecified: Secondary | ICD-10-CM | POA: Diagnosis present

## 2016-02-05 DIAGNOSIS — Z931 Gastrostomy status: Secondary | ICD-10-CM | POA: Diagnosis not present

## 2016-02-05 DIAGNOSIS — R609 Edema, unspecified: Secondary | ICD-10-CM | POA: Insufficient documentation

## 2016-02-05 DIAGNOSIS — F4323 Adjustment disorder with mixed anxiety and depressed mood: Secondary | ICD-10-CM | POA: Insufficient documentation

## 2016-02-05 DIAGNOSIS — Z930 Tracheostomy status: Secondary | ICD-10-CM | POA: Diagnosis not present

## 2016-02-05 DIAGNOSIS — R0602 Shortness of breath: Secondary | ICD-10-CM | POA: Insufficient documentation

## 2016-02-05 DIAGNOSIS — Z79899 Other long term (current) drug therapy: Secondary | ICD-10-CM | POA: Diagnosis not present

## 2016-02-05 DIAGNOSIS — S065X0D Traumatic subdural hemorrhage without loss of consciousness, subsequent encounter: Principal | ICD-10-CM

## 2016-02-05 DIAGNOSIS — I10 Essential (primary) hypertension: Secondary | ICD-10-CM | POA: Diagnosis present

## 2016-02-05 DIAGNOSIS — S12600D Unspecified displaced fracture of seventh cervical vertebra, subsequent encounter for fracture with routine healing: Secondary | ICD-10-CM | POA: Diagnosis not present

## 2016-02-05 DIAGNOSIS — S3210XS Unspecified fracture of sacrum, sequela: Secondary | ICD-10-CM

## 2016-02-05 DIAGNOSIS — S22009S Unspecified fracture of unspecified thoracic vertebra, sequela: Secondary | ICD-10-CM

## 2016-02-05 DIAGNOSIS — S061X3A Traumatic cerebral edema with loss of consciousness of 1 hour to 5 hours 59 minutes, initial encounter: Secondary | ICD-10-CM | POA: Diagnosis present

## 2016-02-05 DIAGNOSIS — S129XXS Fracture of neck, unspecified, sequela: Secondary | ICD-10-CM

## 2016-02-05 DIAGNOSIS — S0101XD Laceration without foreign body of scalp, subsequent encounter: Secondary | ICD-10-CM

## 2016-02-05 LAB — CREATININE, SERUM: Creatinine, Ser: 0.51 mg/dL (ref 0.44–1.00)

## 2016-02-05 LAB — CBC
HCT: 41.4 % (ref 36.0–46.0)
Hemoglobin: 12.6 g/dL (ref 12.0–15.0)
MCH: 28.6 pg (ref 26.0–34.0)
MCHC: 30.4 g/dL (ref 30.0–36.0)
MCV: 94.1 fL (ref 78.0–100.0)
PLATELETS: 252 10*3/uL (ref 150–400)
RBC: 4.4 MIL/uL (ref 3.87–5.11)
RDW: 15.3 % (ref 11.5–15.5)
WBC: 10.2 10*3/uL (ref 4.0–10.5)

## 2016-02-05 LAB — GLUCOSE, CAPILLARY
GLUCOSE-CAPILLARY: 107 mg/dL — AB (ref 65–99)
GLUCOSE-CAPILLARY: 131 mg/dL — AB (ref 65–99)
GLUCOSE-CAPILLARY: 135 mg/dL — AB (ref 65–99)
GLUCOSE-CAPILLARY: 151 mg/dL — AB (ref 65–99)

## 2016-02-05 MED ORDER — POTASSIUM CHLORIDE 20 MEQ/15ML (10%) PO SOLN
40.0000 meq | Freq: Two times a day (BID) | ORAL | Status: DC
  Administered 2016-02-05 – 2016-02-19 (×28): 40 meq
  Filled 2016-02-05 (×29): qty 30

## 2016-02-05 MED ORDER — CLONAZEPAM 0.5 MG PO TABS: 0.5000 mg | ORAL_TABLET | Freq: Every day | ORAL | Status: DC

## 2016-02-05 MED ORDER — CLONAZEPAM 0.5 MG PO TABS
0.5000 mg | ORAL_TABLET | Freq: Every day | ORAL | Status: DC
  Administered 2016-02-05 – 2016-02-16 (×11): 0.5 mg
  Filled 2016-02-05 (×11): qty 1

## 2016-02-05 MED ORDER — SACCHAROMYCES BOULARDII 250 MG PO CAPS
250.0000 mg | ORAL_CAPSULE | Freq: Two times a day (BID) | ORAL | Status: DC
  Administered 2016-02-05 – 2016-02-19 (×27): 250 mg via ORAL
  Filled 2016-02-05 (×28): qty 1

## 2016-02-05 MED ORDER — PIVOT 1.5 CAL PO LIQD
1000.0000 mL | ORAL | Status: DC
  Administered 2016-02-05: 1000 mL
  Filled 2016-02-05 (×3): qty 1000

## 2016-02-05 MED ORDER — NYSTATIN 100000 UNIT/GM EX OINT
TOPICAL_OINTMENT | Freq: Two times a day (BID) | CUTANEOUS | Status: DC
  Administered 2016-02-05 – 2016-02-19 (×14): via TOPICAL
  Filled 2016-02-05 (×2): qty 15

## 2016-02-05 MED ORDER — PANTOPRAZOLE SODIUM 40 MG PO PACK
40.0000 mg | PACK | Freq: Every day | ORAL | Status: DC
  Administered 2016-02-06 – 2016-02-18 (×13): 40 mg
  Filled 2016-02-05 (×8): qty 20

## 2016-02-05 MED ORDER — INSULIN ASPART 100 UNIT/ML ~~LOC~~ SOLN
0.0000 [IU] | SUBCUTANEOUS | Status: DC
  Administered 2016-02-06 (×3): 2 [IU] via SUBCUTANEOUS
  Administered 2016-02-06: 3 [IU] via SUBCUTANEOUS
  Administered 2016-02-07 – 2016-02-08 (×4): 2 [IU] via SUBCUTANEOUS

## 2016-02-05 MED ORDER — ARTIFICIAL TEARS OP OINT
TOPICAL_OINTMENT | OPHTHALMIC | Status: DC | PRN
  Filled 2016-02-05: qty 3.5

## 2016-02-05 MED ORDER — PRAMIPEXOLE DIHYDROCHLORIDE 0.25 MG PO TABS
0.2500 mg | ORAL_TABLET | Freq: Three times a day (TID) | ORAL | Status: DC
  Administered 2016-02-05 – 2016-02-08 (×10): 0.25 mg via ORAL
  Filled 2016-02-05 (×12): qty 1

## 2016-02-05 MED ORDER — ENOXAPARIN SODIUM 40 MG/0.4ML ~~LOC~~ SOLN: 40.0000 mg | SUBCUTANEOUS | Status: DC

## 2016-02-05 MED ORDER — ACETAMINOPHEN 160 MG/5ML PO SOLN
650.0000 mg | ORAL | Status: DC | PRN
  Administered 2016-02-10 – 2016-02-18 (×11): 650 mg
  Filled 2016-02-05 (×11): qty 20.3

## 2016-02-05 MED ORDER — QUETIAPINE FUMARATE 100 MG PO TABS
200.0000 mg | ORAL_TABLET | Freq: Three times a day (TID) | ORAL | Status: DC
  Administered 2016-02-05: 200 mg
  Filled 2016-02-05: qty 2

## 2016-02-05 MED ORDER — ALBUTEROL SULFATE (2.5 MG/3ML) 0.083% IN NEBU: 2.5000 mg | INHALATION_SOLUTION | RESPIRATORY_TRACT | Status: DC | PRN

## 2016-02-05 MED ORDER — IPRATROPIUM-ALBUTEROL 0.5-2.5 (3) MG/3ML IN SOLN
3.0000 mL | Freq: Four times a day (QID) | RESPIRATORY_TRACT | Status: DC | PRN
  Administered 2016-02-16: 3 mL via RESPIRATORY_TRACT
  Filled 2016-02-05: qty 3

## 2016-02-05 MED ORDER — AMANTADINE HCL 50 MG/5ML PO SYRP
100.0000 mg | ORAL_SOLUTION | Freq: Two times a day (BID) | ORAL | Status: DC
Start: 2016-02-05 — End: 2016-02-09
  Administered 2016-02-05 – 2016-02-08 (×7): 100 mg
  Filled 2016-02-05 (×9): qty 10

## 2016-02-05 MED ORDER — ENOXAPARIN SODIUM 40 MG/0.4ML ~~LOC~~ SOLN
40.0000 mg | SUBCUTANEOUS | Status: DC
  Administered 2016-02-06 – 2016-02-18 (×13): 40 mg via SUBCUTANEOUS
  Filled 2016-02-05 (×14): qty 0.4

## 2016-02-05 MED ORDER — HYDROCODONE-ACETAMINOPHEN 7.5-325 MG/15ML PO SOLN
10.0000 mL | ORAL | Status: DC | PRN
  Administered 2016-02-07: 20 mL via ORAL
  Administered 2016-02-07 – 2016-02-10 (×8): 15 mL via ORAL
  Administered 2016-02-10: 20 mL via ORAL
  Administered 2016-02-10: 10 mL via ORAL
  Administered 2016-02-10 – 2016-02-11 (×2): 20 mL via ORAL
  Administered 2016-02-11 – 2016-02-12 (×3): 15 mL via ORAL
  Administered 2016-02-12: 10 mL via ORAL
  Administered 2016-02-12 – 2016-02-13 (×4): 15 mL via ORAL
  Administered 2016-02-13 (×2): 20 mL via ORAL
  Administered 2016-02-13 – 2016-02-17 (×17): 15 mL via ORAL
  Administered 2016-02-18: 20 mL via ORAL
  Administered 2016-02-18 – 2016-02-19 (×4): 15 mL via ORAL
  Filled 2016-02-05: qty 15
  Filled 2016-02-05: qty 30
  Filled 2016-02-05 (×9): qty 15
  Filled 2016-02-05: qty 30
  Filled 2016-02-05: qty 15
  Filled 2016-02-05: qty 30
  Filled 2016-02-05: qty 15
  Filled 2016-02-05: qty 30
  Filled 2016-02-05 (×5): qty 15
  Filled 2016-02-05: qty 30
  Filled 2016-02-05 (×3): qty 15
  Filled 2016-02-05: qty 30
  Filled 2016-02-05 (×5): qty 15
  Filled 2016-02-05: qty 30
  Filled 2016-02-05 (×4): qty 15
  Filled 2016-02-05: qty 30
  Filled 2016-02-05 (×10): qty 15

## 2016-02-05 NOTE — Progress Notes (Signed)
Patient ID: [name], female   DOB: 12/31/1970, 45 y.o.   MRN: [id number] Patient arrived from [facility] via wheelchair with RN, NT, and belongings. Patient placed in bed, soft waist belt in place and bed alarm on. Will continue admission education paperwork when family arrives on 02/06/16 as patient is unable to answer so of the questions and understand some of the admission papers.
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
## 2016-02-05 NOTE — Interval H&P Note (Signed)
[name] was admitted today to Inpatient Rehabilitation with the diagnosis of TBI/SDH/left frontal ICC/IVH/transverse process fracture C7 and T1.  The patient's history has been reviewed, patient examined, and there is no change in status.  Patient continues to be appropriate for intensive inpatient rehabilitation.  I have reviewed the patient's chart and labs.  Questions were answered to the patient's satisfaction. The PAPE has been reviewed and assessment remains appropriate.  Yahya Boldman Karis Jubanil Kieffer Blatz 02/05/2016, 9:31 PM

## 2016-02-05 NOTE — Progress Notes (Signed)
Inpatient Rehabilitation  I have received medical clearance from Charma IgoMichael Jeffery, trauma PA to admit pt. to IP Rehab today.  I noted pt. was able to tolerate sitting up in recliner several hours over the weekend and is now prepared to transition to CIR.  I   visited pt. at the bedside  then spoke with her mom Reina FuseMary Williamson over the phone. I  Have updated pt. and her mom that we will plan to admit pt. today.  Pt. gave a "thumbs up" when I informed her.  I have updated pt's RN Danford BadKristie of the plan as well as Macario GoldsJesse Scinto CSW and Sidney AceJulie Amerson, RNCM.  I plan to meet with pt.s mom in pt's room at 1:30 to go over the admission paperwork.  Please call if questions.  Weldon PickingSusan Trev Boley PT Inpatient Rehab Admissions Coordinator Cell [phone] Office [phone]
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
## 2016-02-05 NOTE — Care Management Note (Signed)
Case Management Note  Patient Details  Name: [name] MRN: [id number] Date of Birth: 09/21/1970  Subjective/Objective:  Pt medically stable for dc today.                    Action/Plan: Plan dc to [facility] later today.    Expected Discharge Date:   02/05/2016               Expected Discharge Plan:  IP Rehab Facility  In-House Referral:  Clinical Social Work  Discharge planning Services  CM Consult  Post Acute Care Choice:  IP Rehab Choice offered to:     DME Arranged:    DME Agency:     HH Arranged:    HH Agency:     Status of Service:  Completed, signed off  Medicare Important Message Given:    Date Medicare IM Given:    Medicare IM give by:    Date Additional Medicare IM Given:    Additional Medicare Important Message give by:     If discussed at Long Length of Stay Meetings, dates discussed:    Additional Comments:  Quintella BatonJulie W. Azion Centrella, RN, BSN  Trauma/Neuro ICU Case Manager [phone]

## 2016-02-05 NOTE — Progress Notes (Signed)
Report given to 4W
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
## 2016-02-06 ENCOUNTER — Inpatient Hospital Stay (HOSPITAL_COMMUNITY): Payer: Medicaid Other

## 2016-02-06 ENCOUNTER — Inpatient Hospital Stay (HOSPITAL_COMMUNITY): Payer: Medicaid Other | Admitting: Speech Pathology

## 2016-02-06 ENCOUNTER — Inpatient Hospital Stay (HOSPITAL_COMMUNITY): Payer: Medicaid Other | Admitting: Occupational Therapy

## 2016-02-06 ENCOUNTER — Inpatient Hospital Stay (HOSPITAL_COMMUNITY): Payer: Medicaid Other | Admitting: Physical Therapy

## 2016-02-06 DIAGNOSIS — R609 Edema, unspecified: Secondary | ICD-10-CM

## 2016-02-06 DIAGNOSIS — S069X3S Unspecified intracranial injury with loss of consciousness of 1 hour to 5 hours 59 minutes, sequela: Secondary | ICD-10-CM

## 2016-02-06 LAB — GLUCOSE, CAPILLARY
GLUCOSE-CAPILLARY: 122 mg/dL — AB (ref 65–99)
GLUCOSE-CAPILLARY: 119 mg/dL — AB (ref 65–99)
Glucose-Capillary: 119 mg/dL — ABNORMAL HIGH (ref 65–99)
Glucose-Capillary: 123 mg/dL — ABNORMAL HIGH (ref 65–99)
Glucose-Capillary: 127 mg/dL — ABNORMAL HIGH (ref 65–99)
Glucose-Capillary: 167 mg/dL — ABNORMAL HIGH (ref 65–99)

## 2016-02-06 LAB — CBC WITH DIFFERENTIAL/PLATELET
BASOS ABS: 0 10*3/uL (ref 0.0–0.1)
BASOS PCT: 0 %
Eosinophils Absolute: 0.6 10*3/uL (ref 0.0–0.7)
Eosinophils Relative: 6 %
HEMATOCRIT: 41.9 % (ref 36.0–46.0)
HEMOGLOBIN: 13 g/dL (ref 12.0–15.0)
Lymphocytes Relative: 32 %
Lymphs Abs: 3.2 10*3/uL (ref 0.7–4.0)
MCH: 29 pg (ref 26.0–34.0)
MCHC: 31 g/dL (ref 30.0–36.0)
MCV: 93.3 fL (ref 78.0–100.0)
Monocytes Absolute: 0.5 10*3/uL (ref 0.1–1.0)
Monocytes Relative: 5 %
NEUTROS ABS: 5.7 10*3/uL (ref 1.7–7.7)
NEUTROS PCT: 57 %
Platelets: 277 10*3/uL (ref 150–400)
RBC: 4.49 MIL/uL (ref 3.87–5.11)
RDW: 15.4 % (ref 11.5–15.5)
WBC: 10.1 10*3/uL (ref 4.0–10.5)

## 2016-02-06 LAB — COMPREHENSIVE METABOLIC PANEL
ALBUMIN: 3.8 g/dL (ref 3.5–5.0)
ALK PHOS: 285 U/L — AB (ref 38–126)
ALT: 110 U/L — ABNORMAL HIGH (ref 14–54)
AST: 72 U/L — AB (ref 15–41)
Anion gap: 11 (ref 5–15)
BILIRUBIN TOTAL: 0.6 mg/dL (ref 0.3–1.2)
BUN: 24 mg/dL — AB (ref 6–20)
CALCIUM: 10.3 mg/dL (ref 8.9–10.3)
CO2: 26 mmol/L (ref 22–32)
Chloride: 102 mmol/L (ref 101–111)
Creatinine, Ser: 0.54 mg/dL (ref 0.44–1.00)
GFR calc Af Amer: >60 mL/min (ref >60)
GFR calc non Af Amer: >60 mL/min (ref >60)
GLUCOSE: 126 mg/dL — AB (ref 65–99)
POTASSIUM: 3.9 mmol/L (ref 3.5–5.1)
Sodium: 139 mmol/L (ref 135–145)
TOTAL PROTEIN: 8.3 g/dL — AB (ref 6.5–8.1)

## 2016-02-06 MED ORDER — QUETIAPINE FUMARATE 50 MG PO TABS
150.0000 mg | ORAL_TABLET | Freq: Three times a day (TID) | ORAL | Status: DC
  Administered 2016-02-06 (×2): 150 mg
  Filled 2016-02-06 (×2): qty 1

## 2016-02-06 MED ORDER — PRO-STAT SUGAR FREE PO LIQD
30.0000 mL | Freq: Two times a day (BID) | ORAL | Status: DC
  Administered 2016-02-06 – 2016-02-08 (×6): 30 mL
  Filled 2016-02-06 (×5): qty 30

## 2016-02-06 MED ORDER — FREE WATER
200.0000 mL | Freq: Four times a day (QID) | Status: DC
  Administered 2016-02-06 – 2016-02-08 (×11): 200 mL

## 2016-02-06 MED ORDER — JEVITY 1.5 CAL/FIBER PO LIQD
1000.0000 mL | ORAL | Status: DC
  Administered 2016-02-06 – 2016-02-07 (×2): 1000 mL
  Filled 2016-02-06 (×6): qty 1000
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
## 2016-02-06 NOTE — Progress Notes (Signed)
Patient information reviewed and entered into eRehab system by Delorean Knutzen, RN, CRRN, PPS Coordinator.  Information including medical coding and functional independence measure will be reviewed and updated through discharge.    

## 2016-02-06 NOTE — Progress Notes (Signed)
*  Preliminary Results* Bilateral lower extremity venous duplex completed. Bilateral lower extremities are negative for deep vein thrombosis. There is no evidence of Baker's cyst bilaterally.  02/06/2016  Gertie FeyMichelle Deryk Bozman, RVT, RDCS, RDMS
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
## 2016-02-07 ENCOUNTER — Inpatient Hospital Stay (HOSPITAL_COMMUNITY): Payer: Medicaid Other | Admitting: Speech Pathology

## 2016-02-07 ENCOUNTER — Inpatient Hospital Stay (HOSPITAL_COMMUNITY): Payer: Self-pay | Admitting: Physical Therapy

## 2016-02-07 ENCOUNTER — Inpatient Hospital Stay (HOSPITAL_COMMUNITY): Payer: Self-pay

## 2016-02-07 ENCOUNTER — Inpatient Hospital Stay (HOSPITAL_COMMUNITY): Payer: Medicaid Other | Admitting: Physical Therapy

## 2016-02-07 DIAGNOSIS — Z930 Tracheostomy status: Secondary | ICD-10-CM

## 2016-02-07 DIAGNOSIS — S061X3S Traumatic cerebral edema with loss of consciousness of 1 hour to 5 hours 59 minutes, sequela: Secondary | ICD-10-CM

## 2016-02-07 DIAGNOSIS — R1310 Dysphagia, unspecified: Secondary | ICD-10-CM

## 2016-02-07 LAB — GLUCOSE, CAPILLARY
GLUCOSE-CAPILLARY: 100 mg/dL — AB (ref 65–99)
GLUCOSE-CAPILLARY: 114 mg/dL — AB (ref 65–99)
GLUCOSE-CAPILLARY: 120 mg/dL — AB (ref 65–99)
GLUCOSE-CAPILLARY: 125 mg/dL — AB (ref 65–99)
GLUCOSE-CAPILLARY: 134 mg/dL — AB (ref 65–99)
Glucose-Capillary: 129 mg/dL — ABNORMAL HIGH (ref 65–99)
Glucose-Capillary: 114 mg/dL — ABNORMAL HIGH (ref 65–99)

## 2016-02-07 MED ORDER — QUETIAPINE FUMARATE 100 MG PO TABS
100.0000 mg | ORAL_TABLET | Freq: Three times a day (TID) | ORAL | Status: DC
  Administered 2016-02-07 (×2): 100 mg
  Filled 2016-02-07 (×2): qty 1

## 2016-02-07 MED ORDER — ONDANSETRON HCL 4 MG PO TABS
4.0000 mg | ORAL_TABLET | Freq: Three times a day (TID) | ORAL | Status: DC | PRN
  Administered 2016-02-07 – 2016-02-18 (×5): 4 mg
  Filled 2016-02-07 (×5): qty 1
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
## 2016-02-07 NOTE — Progress Notes (Signed)
Speech Language Pathology Daily Session Note  Patient Details  Name: [name] MRN: [id number] Date of Birth: 01/06/1971  Today's Date: 02/07/2016 SLP Individual Time: 1000-1100 SLP Individual Time Calculation (min): 60 min  Short Term Goals: Week 1: SLP Short Term Goal 1 (Week 1): Patient will tolerate PMSV during all waking hours with vitals remaining WFL with Mod I.  SLP Short Term Goal 2 (Week 1): Patient will maximize speech intelligibility to ~75% at the sentence level with Min verbal cues for use of over-articulation and an increased vocal intensity.  SLP Short Term Goal 3 (Week 1): Patient will consume trials of thin liquids with minimal overt s/s of aspiration to determine readiness for MBS SLP Short Term Goal 4 (Week 1): Patient will demonstrate sustained attention to functional tasks for 30 minutes with Min A verbal cues for redirection.  SLP Short Term Goal 5 (Week 1): Patient will identify 2 cognitive and 2 physical deficits with Mod A multimodal cues.  SLP Short Term Goal 6 (Week 1): Patient will utilize call bell to request assistance in 25% of opportunities with Max A multimodal cues.   Skilled Therapeutic Interventions: Skilled treatment session focused on speech and swallowing goals. SLP facilitated the session by donning the PMSV. Patient tolerated the PMSV for ~1 hour with all vitals remaining WFL and required Mod-Max A verbal cues for use of an increased vocal intensity to maximize intelligibility to ~50-75% at the phrase and sentence level. SLP also facilitated session by providing trials of ice chips and thin liquids. Patient consumed without overt s/s of aspiration. However, recommend MBS prior to diet initiation due to risk of silent aspiration secondary to prolonged intubation, trach, etc. Patient verbalized understanding. Patient left upright in recliner with NT present. Continue with current plan of care.    Function:  Eating Eating   Modified Consistency  Diet: Yes Eating Assist Level: Helper feeds patient (with trials from SLP )           Cognition Comprehension Comprehension assist level: Understands basic 75 - 89% of the time/ requires cueing 10 - 24% of the time  Expression Expression assistive device: Talk trach valve Expression assist level: Expresses basic 50 - 74% of the time/requires cueing 25 - 49% of the time. Needs to repeat parts of sentences.  Social Interaction Social Interaction assist level: Interacts appropriately 75 - 89% of the time - Needs redirection for appropriate language or to initiate interaction.  Problem Solving Problem solving assist level: Solves basic 25 - 49% of the time - needs direction more than half the time to initiate, plan or complete simple activities  Memory Memory assist level: Recognizes or recalls 25 - 49% of the time/requires cueing 50 - 75% of the time    Pain Pain Assessment Pain Assessment: No/denies pain  Therapy/Group: Individual Therapy  Berk Pilot 02/07/2016, 3:41 PM

## 2016-02-07 NOTE — Progress Notes (Signed)
Orthopedic Tech Progress Note Patient Details:  [name] 01/22/1971 [id number]  Ortho Devices Type of Ortho Device: Abdominal binder Ortho Device/Splint Location: Pt needed a bigger abdominal binder Ortho Device/Splint Interventions: Application   April FordyceJennifer C Kaitlyne Wong 02/07/2016, 2:41 PM

## 2016-02-07 NOTE — Progress Notes (Signed)
Orthopedic Tech Progress Note Patient Details:  [name] 01/04/1971 [id number]  Ortho Devices Type of Ortho Device: Abdominal binder Ortho Device/Splint Location: abdomen Ortho Device/Splint Interventions: Freeman CaldronOrdered   [name] 02/07/2016, 11:45 AM

## 2016-02-07 NOTE — Progress Notes (Signed)
Occupational Therapy Note  Patient Details  Name: [name] MRN: [id number] Date of Birth: 09/08/1970  Today's Date: 02/07/2016 OT Individual Time: 1130-1200 OT Individual Time Calculation (min): 30 min   Pt stated her headache was much better than previous session Individual Therapy  Pt resting in recliner upon arrival.  Focus on discharge planning, NWB precautions, OT goals, and purpose of goal.  Pt verbalized understanding and agreement.    April NeriLanier, [name] [facility] HospitalChappell 02/07/2016, 12:10 PM
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
## 2016-02-07 NOTE — Progress Notes (Signed)
Physical Therapy Session Note  Patient Details  Name: [name] MRN: [id number] Date of Birth: 11/13/1970  Today's Date: 02/07/2016 PT Individual Time: 1415-1430 PT Individual Time Calculation (min): 15 min   Short Term Goals: Week 1:  PT Short Term Goal 1 (Week 1): Patient will transfer bed <> wheelchair using LRAD with consistent mod A while maintaining NWB LLE.  PT Short Term Goal 2 (Week 1): Patient will ambulate 15 ft using RW with mod A while maintaining NWB LLE.  PT Short Term Goal 3 (Week 1): Patient will transfer sit <> stand using LRAD with consistent min A while maintaining NWB LLE.  PT Short Term Goal 4 (Week 1): Patient will sustain attention to functional task x 30 min with min cues.  PT Short Term Goal 5 (Week 1): Patient will identify 1 physical and 1 cognitive deficit with mod cues.   Skilled Therapeutic Interventions/Progress Updates:   Patient sitting in wheelchair, handoff from PT. Previous clinician reporting patient with high BP and pulled out trach, respiratory present to re-insert trach. Patient instructed in wheelchair propulsion using BUE with max multimodal cues for initiating/maintaining LUE due to ?inattention x 50 ft with supervision-min A. Assessed seated BP 171/111, RN aware. Patient returned to room with family present, family instructed to notify nursing staff when they plan to leave and to monitor patient to prevent her from pulling out trach/PEG, family verbalized understanding. Patient left sitting in wheelchair with family providing supervision.   Therapy Documentation Precautions:  Precautions Precautions: Fall, Cervical Cervical Brace: At all times, Hard collar Restrictions Weight Bearing Restrictions: Yes LLE Weight Bearing: Non weight bearing General: PT Amount of Missed Time (min): 60 Minutes PT Missed Treatment Reason: Other (Comment) (elevated BP) Vital Signs: Therapy Vitals Temp: 99.5 F (37.5 C) Temp Source: Oral Pulse Rate:  90 Resp: 16 BP: (!) 171/111 mmHg (RN notified) Patient Position (if appropriate): Sitting Oxygen Therapy SpO2: 99 % O2 Device: Not Delivered Pulse Oximetry Type: Continuous Pain: Pain Assessment Pain Assessment: No/denies pain   See Function Navigator for Current Functional Status.   Therapy/Group: Individual Therapy  Kerney ElbeVarner, Franck Vinal A 02/07/2016, 2:41 PM

## 2016-02-07 NOTE — Progress Notes (Signed)
Physical Therapy Session Note  Patient Details  Name: [name] MRN: [id number] Date of Birth: 12/10/1970  Today's Date: 02/07/2016 PT Individual Time: 1330-1400 PT Individual Time Calculation (min): 30 min   Short Term Goals: Week 1:  PT Short Term Goal 1 (Week 1): Patient will transfer bed <> wheelchair using LRAD with consistent mod A while maintaining NWB LLE.  PT Short Term Goal 2 (Week 1): Patient will ambulate 15 ft using RW with mod A while maintaining NWB LLE.  PT Short Term Goal 3 (Week 1): Patient will transfer sit <> stand using LRAD with consistent min A while maintaining NWB LLE.  PT Short Term Goal 4 (Week 1): Patient will sustain attention to functional task x 30 min with min cues.  PT Short Term Goal 5 (Week 1): Patient will identify 1 physical and 1 cognitive deficit with mod cues.   Skilled Therapeutic Interventions/Progress Updates:   Pt received in recliner with RN present.  Pt received abdominal binder but still too small.  PMSV applied and pt able to verbalize need to use toilet.  Performed stand pivot with min-mod A with therapist assisting with maintaining LLE NWB.  Pt transported into bathroom with w/c; pt required +2 for safe stand pivot w/c <> toilet with UE support on grab bar and therapist maintaining NWB LLE due to pt with sudden lateral LOB.  Pt able to perform hygiene on toilet but required +2 to don clean brief and maintain balance in standing with UE support on grab bar.  Returned to w/c and performed hand hygiene at sink with max verbal cues to sequence.  NT performed vitals assessment with BP elevated.  Also while in sitting pt pulling at trach collar with trach pulling out slightly.  RN made aware of vitals and trach; respiratory call to assess placement.  Pt handed off to primary PT.  Therapy Documentation Precautions:  Precautions Precautions: Fall, Cervical Cervical Brace: At all times, Hard collar Restrictions Weight Bearing Restrictions:  Yes LLE Weight Bearing: Non weight bearing General: PT Amount of Missed Time (min): 60 Minutes PT Missed Treatment Reason: Other (Comment) (elevated BP) Vital Signs: Therapy Vitals Temp: 99 F (37.2 C) Temp Source: Oral Pulse Rate: 94 Resp: 16 BP: (!) 181/106 mmHg Patient Position (if appropriate): Sitting Oxygen Therapy SpO2: 99 % O2 Device: Not Delivered Pulse Oximetry Type: Continuous Pain: Pain Assessment Pain Assessment: No/denies pain Pain Score: 6   See Function Navigator for Current Functional Status.   Therapy/Group: Individual Therapy  [name], Lidiya Reise [facility] 02/07/2016, 5:35 PM

## 2016-02-07 NOTE — Progress Notes (Signed)
Social Work Patient ID: [name], female   DOB: 02/28/1971, 45 y.o.   MRN: [id number]   Have reviewed team conference with pt and mother.  Aware of targeted 3 wk LOS, however, explained that goals are still being worked through as this was pt's first day of therapies.  Plan to follow up with them end of week to update on goals.  Shemekia Patane, LCSW

## 2016-02-07 NOTE — Progress Notes (Signed)
RN called d/t pt pulled trach out.  Obturator used and trach was re-inserted.  + easy cap color change (purple to yellow), + BBSH=, no distress noted. Pt tol well, family and Physical therapy at bedside s/p placing trach back in .

## 2016-02-07 NOTE — Progress Notes (Signed)
Occupational Therapy Session Note  Patient Details  Name: [name] MRN: [id number] Date of Birth: 09/01/1970  Today's Date: 02/07/2016 OT Individual Time: 0900-1000 OT Individual Time Calculation (min): 60 min    Short Term Goals: Week 1:  OT Short Term Goal 1 (Week 1): Pt will perform toileting with min A in order to increase I with functional tasks.  OT Short Term Goal 2 (Week 1): Pt will perform LB dressing with min A in order to increase I with self care.  OT Short Term Goal 3 (Week 1): Pt will maintain NWB in L LE during functional transfer with min verbal cues for safety. OT Short Term Goal 4 (Week 1): Pt will perform grooming at sink with supervision and min cues for tasks.   Skilled Therapeutic Interventions/Progress Updates:    Pt engaged in BADL retraining including bathing at EOB and donning hospital gown.  Pt does not currently have any clothing.  Pt initiated all tasks when presented with supplies.  Pt stood from EOB X 4 with +2 to assure pt maintains NWB of LLE.  Pt requires max multimodal cues to hold LLE off ground.  Pt requires more than a reasonable amount of time to complete all tasks.  Pt transferred to recliner with mod +2.  Pt remained in recliner with soft waist belt in place and all needs within reach.   Therapy Documentation Precautions:  Precautions Precautions: Fall, Cervical Cervical Brace: At all times, Hard collar Restrictions Weight Bearing Restrictions: Yes LLE Weight Bearing: Non weight bearing Pain: Pain Assessment Pain Assessment: Faces Faces Pain Scale: Hurts even more Pain Type: Acute pain Pain Location: Head Pain Orientation: Anterior;Posterior Pain Descriptors / Indicators: Headache Pain Frequency: Intermittent Pain Onset: Gradual Patients Stated Pain Goal: 3 Pain Intervention(s):RN aware; Medication  See Function Navigator for Current Functional Status.   Therapy/Group: Individual Therapy  Rich BraveLanier, Kordel Leavy Chappell 02/07/2016,  11:28 AM

## 2016-02-07 NOTE — Progress Notes (Signed)
Trach changed to 4 Shiley cuffless per MD order.  + easy cap color change (purple to yellow), + BBSH rhonchi = t/o.  Sat 97% on room air.    S/p trach change- pt coughing/gag then vomited yellow bile looking vomit.  Suctioned trach for bloody secretions (d/t trach change) no yellow vomit noted while suctioning trach.  RN notified and she is notifying MD.

## 2016-02-08 ENCOUNTER — Inpatient Hospital Stay (HOSPITAL_COMMUNITY): Payer: Medicaid Other

## 2016-02-08 ENCOUNTER — Inpatient Hospital Stay (HOSPITAL_COMMUNITY): Payer: Self-pay | Admitting: Physical Therapy

## 2016-02-08 ENCOUNTER — Inpatient Hospital Stay (HOSPITAL_COMMUNITY): Payer: Medicaid Other | Admitting: Speech Pathology

## 2016-02-08 ENCOUNTER — Inpatient Hospital Stay (HOSPITAL_COMMUNITY): Payer: Medicaid Other | Admitting: Physical Therapy

## 2016-02-08 LAB — GLUCOSE, CAPILLARY
GLUCOSE-CAPILLARY: 133 mg/dL — AB (ref 65–99)
GLUCOSE-CAPILLARY: 82 mg/dL (ref 65–99)
Glucose-Capillary: 105 mg/dL — ABNORMAL HIGH (ref 65–99)
Glucose-Capillary: 100 mg/dL — ABNORMAL HIGH (ref 65–99)
Glucose-Capillary: 99 mg/dL (ref 65–99)

## 2016-02-08 MED ORDER — QUETIAPINE FUMARATE 100 MG PO TABS
100.0000 mg | ORAL_TABLET | Freq: Two times a day (BID) | ORAL | Status: DC
  Administered 2016-02-08 – 2016-02-09 (×4): 100 mg
  Filled 2016-02-08 (×4): qty 1

## 2016-02-08 MED ORDER — CLONIDINE HCL 0.1 MG PO TABS
0.1000 mg | ORAL_TABLET | ORAL | Status: DC | PRN
  Administered 2016-02-08: 0.1 mg via ORAL
  Filled 2016-02-08: qty 1

## 2016-02-08 MED ORDER — CLONIDINE HCL 0.1 MG PO TABS
0.1000 mg | ORAL_TABLET | ORAL | Status: DC | PRN
  Administered 2016-02-09 – 2016-02-19 (×5): 0.1 mg via ORAL
  Filled 2016-02-08 (×4): qty 1

## 2016-02-08 NOTE — Progress Notes (Signed)
Pt's trach capped. Inner Cannula cleaned and placed in bag at head of bed. ATC turned off. RT will continue to monitor.
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
## 2016-02-08 NOTE — Progress Notes (Signed)
Patient tolerating trach being capped on RA with a spo2 of 97%. No suctioned needed at this time.
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
## 2016-02-08 NOTE — Progress Notes (Signed)
Speech Language Pathology Daily Session Note  Patient Details  Name: [name] MRN: [id number] Date of Birth: 11/12/1970  Today's Date: 02/08/2016 SLP Individual Time: 1230-1300 SLP Individual Time Calculation (min): 30 min    MBSS complete. Full report located under chart review in imaging section.  Short Term Goals: Week 1: SLP Short Term Goal 1 (Week 1): Patient will tolerate PMSV during all waking hours with vitals remaining WFL with Mod I.  SLP Short Term Goal 2 (Week 1): Patient will maximize speech intelligibility to ~75% at the sentence level with Min verbal cues for use of over-articulation and an increased vocal intensity.  SLP Short Term Goal 3 (Week 1): Patient will consume trials of thin liquids with minimal overt s/s of aspiration to determine readiness for MBS SLP Short Term Goal 4 (Week 1): Patient will demonstrate sustained attention to functional tasks for 30 minutes with Min A verbal cues for redirection.  SLP Short Term Goal 5 (Week 1): Patient will identify 2 cognitive and 2 physical deficits with Mod A multimodal cues.  SLP Short Term Goal 6 (Week 1): Patient will utilize call bell to request assistance in 25% of opportunities with Max A multimodal cues.   Skilled Therapeutic Interventions: Pt seen for followup of MBSS. Dys 2 diet with thin liquids provided. Pt required MAX verbal/tactile cueing to achieve adequate alertness for PO. Pt consistently responded with "That tastes like shit" to all PO offered- solids and liquids. No overt s/s aspiration, however limited intake due to pt refusal. Reviewed aspiration precautions with RN and posted in pt's room.   Function:  Eating Eating   Modified Consistency Diet: Yes Eating Assist Level: More than reasonable amount of time;Set up assist for;Supervision or verbal cues;Helper checks for pocketed food;Helper scoops food on utensil   Eating Set Up Assist For: Opening containers;Cutting food Helper Scoops Food on  Utensil: Occasionally     Cognition Comprehension Comprehension assist level: Understands basic 50 - 74% of the time/ requires cueing 25 - 49% of the time  Expression Expression assistive device: Talk trach valve Expression assist level: Expresses basic 50 - 74% of the time/requires cueing 25 - 49% of the time. Needs to repeat parts of sentences.  Social Interaction Social Interaction assist level: Interacts appropriately less than 25% of the time. May be withdrawn or combative.  Problem Solving Problem solving assist level: Solves basic less than 25% of the time - needs direction nearly all the time or does not effectively solve problems and may need a restraint for safety  Memory Memory assist level: Recognizes or recalls less than 25% of the time/requires cueing greater than 75% of the time    Pain Pain Assessment Pain Assessment: 0-10 Pain Score: Asleep  Therapy/Group: Individual Therapy  Rocky CraftsKara E Briani Maul 02/08/2016, 4:29 PM

## 2016-02-08 NOTE — Care Management Note (Signed)
[facility] Individual Statement of Services  Patient Name:  Frederich ChickCrystal D Alatorre  Date:  02/08/2016  Welcome [facility] the [facility].  Our goal is to provide you with an individualized program based on your diagnosis and situation, designed to meet your specific needs.  With this comprehensive rehabilitation program, you will be expected to participate in at least 3 hours of rehabilitation therapies Monday-Friday, with modified therapy programming on the weekends.  Your rehabilitation program will include the following services:  Physical Therapy (PT), Occupational Therapy (OT), Speech Therapy (ST), 24 hour per day rehabilitation nursing, Therapeutic Recreaction (TR), Neuropsychology, Case Management (Social Worker), Rehabilitation Medicine, Nutrition Services and Pharmacy Services  Weekly team conferences will be held on Tuesdays to discuss your progress.  Your Social Worker will talk with you frequently to get your input and to update you on team discussions.  Team conferences with you and your family in attendance may also be held.  Expected length of stay: 3 weeks  Overall anticipated outcome: minimal assistance  Depending on your progress and recovery, your program may change. Your Social Worker will coordinate services and will keep you informed of any changes. Your Social Worker's name and contact numbers are listed  below.  The following services may also be recommended but are not provided by the [facility]:   Driving Evaluations  Home Health Rehabiltiation Services  Outpatient Rehabilitation Services  Vocational Rehabilitation   Arrangements will be made to provide these services after discharge if needed.  Arrangements include referral to agencies that provide these services.  Your insurance has been verified to be:  None (Medicaid application pending) Your primary doctor is:  None  Pertinent information will be shared  with your doctor and your insurance company.  Social Worker:  HolcombLucy Evangelia Whitaker, [place] [phone] or ([phone]) [phone]   Information discussed with and copy given to patient by: Amada JupiterHOYLE, Edana Aguado, 02/08/2016, 10:02 AM
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
## 2016-02-08 NOTE — Progress Notes (Signed)
Pts B/P elevated throughout shift, see flowsheet. [name] aware, orders received. Pt c/o 10/10 headache x2, Hycet effective. Trach capped with sat's sustained at 93-98% at rest, with exertion and when asleep.   Continue to monitor

## 2016-02-08 NOTE — Progress Notes (Signed)
Occupational Therapy Session Note  Patient Details  Name: April ChickCrystal D Fitterer MRN: [id number] Date of Birth: 08/11/1971  Today's Date: 02/08/2016 OT Individual Time: 1100-1140 OT Individual Time Calculation (min): 40 min    Short Term Goals: Week 1:  OT Short Term Goal 1 (Week 1): Pt will perform toileting with min A in order to increase I with functional tasks.  OT Short Term Goal 2 (Week 1): Pt will perform LB dressing with min A in order to increase I with self care.  OT Short Term Goal 3 (Week 1): Pt will maintain NWB in L LE during functional transfer with min verbal cues for safety. OT Short Term Goal 4 (Week 1): Pt will perform grooming at sink with supervision and min cues for tasks.   Skilled Therapeutic Interventions/Progress Updates:    Pt resting in bed upon arrival with RN completing trach care.  Pt c/o 10/10 headache and verbalized concern about high BP. Pt c/o 10/10 headache.  Pt followed one step simple commands to sit EOB, stand, fold linens, etc.  Pt stood with mod A +2 (to keep LLE off floor). Pt perseverated on headache and high BP and exhibited difficulty sustaining attention to complete tasks.  Pt returned to bed with soft waist belt and wrist restraints secured.   Therapy Documentation Precautions:  Precautions Precautions: Fall, Cervical Cervical Brace: At all times, Hard collar Restrictions Weight Bearing Restrictions: Yes LLE Weight Bearing: Non weight bearing General: General OT Amount of Missed Time: 20 Minutes Pain: Pain Assessment Pain Assessment: 0-10 Pain Score: 10-Worst pain ever Pain Type: Acute pain Pain Location: Head Pain Orientation: Right Pain Descriptors / Indicators: Headache Pain Onset: On-going Pain Intervention(s): RN made aware  See Function Navigator for Current Functional Status.   Therapy/Group: Individual Therapy  Rich BraveLanier, Ander Wamser Chappell 02/08/2016, 11:47 AM

## 2016-02-09 ENCOUNTER — Inpatient Hospital Stay (HOSPITAL_COMMUNITY): Payer: Medicaid Other | Admitting: Physical Therapy

## 2016-02-09 ENCOUNTER — Inpatient Hospital Stay (HOSPITAL_COMMUNITY): Payer: Self-pay | Admitting: Occupational Therapy

## 2016-02-09 ENCOUNTER — Inpatient Hospital Stay (HOSPITAL_COMMUNITY): Payer: Medicaid Other | Admitting: Speech Pathology

## 2016-02-09 ENCOUNTER — Inpatient Hospital Stay (HOSPITAL_COMMUNITY): Payer: Self-pay | Admitting: Physical Therapy

## 2016-02-09 DIAGNOSIS — I10 Essential (primary) hypertension: Secondary | ICD-10-CM

## 2016-02-09 LAB — GLUCOSE, CAPILLARY
GLUCOSE-CAPILLARY: 93 mg/dL (ref 65–99)
Glucose-Capillary: 120 mg/dL — ABNORMAL HIGH (ref 65–99)
Glucose-Capillary: 96 mg/dL (ref 65–99)
Glucose-Capillary: 89 mg/dL (ref 65–99)
Glucose-Capillary: 100 mg/dL — ABNORMAL HIGH (ref 65–99)

## 2016-02-09 MED ORDER — FREE WATER
200.0000 mL | Freq: Three times a day (TID) | Status: DC
  Administered 2016-02-09 – 2016-02-16 (×19): 200 mL

## 2016-02-09 MED ORDER — ENSURE ENLIVE PO LIQD
237.0000 mL | Freq: Three times a day (TID) | ORAL | Status: DC
  Administered 2016-02-09 – 2016-02-16 (×13): 237 mL via ORAL

## 2016-02-09 MED ORDER — INSULIN ASPART 100 UNIT/ML ~~LOC~~ SOLN
0.0000 [IU] | Freq: Three times a day (TID) | SUBCUTANEOUS | Status: DC
  Administered 2016-02-10 – 2016-02-12 (×3): 2 [IU] via SUBCUTANEOUS

## 2016-02-09 MED ORDER — PRAMIPEXOLE DIHYDROCHLORIDE 0.125 MG PO TABS
0.1250 mg | ORAL_TABLET | Freq: Three times a day (TID) | ORAL | Status: DC
  Administered 2016-02-09 – 2016-02-13 (×14): 0.125 mg via ORAL
  Filled 2016-02-09 (×16): qty 1

## 2016-02-09 MED ORDER — AMANTADINE HCL 50 MG/5ML PO SYRP
100.0000 mg | ORAL_SOLUTION | Freq: Every day | ORAL | Status: DC
  Administered 2016-02-10 – 2016-02-13 (×4): 100 mg
  Filled 2016-02-09 (×5): qty 10
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
## 2016-02-09 NOTE — Progress Notes (Signed)
Pt sitting up in chair, site in good condition. Pt states breathing has been good. SPO2 99%.
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
## 2016-02-09 NOTE — Progress Notes (Addendum)
Occupational Therapy Session Note  Patient Details  Name: [name] MRN: [id number] Date of Birth: 12/29/1970  Today's Date: 02/09/2016 OT Individual Time: 1105-1200 OT Individual Time Calculation (min): 55 min    Short Term Goals: Week 1:  OT Short Term Goal 1 (Week 1): Pt will perform toileting with min A in order to increase I with functional tasks.  OT Short Term Goal 2 (Week 1): Pt will perform LB dressing with min A in order to increase I with self care.  OT Short Term Goal 3 (Week 1): Pt will maintain NWB in L LE during functional transfer with min verbal cues for safety. OT Short Term Goal 4 (Week 1): Pt will perform grooming at sink with supervision and min cues for tasks.   Skilled Therapeutic Interventions/Progress Updates:    1:1 self care retraining at shower level. Pt very preservative and upset her trach was still in after she recalled MD said it could come out today. Pt internally distracted but this and difficult at times to redirect and easily frustrated. Pt required max to total verbal and tactile cues for adhere to her NWB precautions on left LE during transfers to toilet and into shower onto shower chair. Pt's head very sensitive to touch due to knots and scalp wound. Respiratory therapy came in to decannulate  pt in bed. Education, demonstration and return practice on occluding trach stoma when coughing or talking to prevent airflow to encourage healing. Pt left in bed with 4 rails without other restraints- safety plan updated.   Therapy Documentation Precautions:  Precautions Precautions: Fall, Cervical Cervical Brace: At all times, Hard collar Restrictions Weight Bearing Restrictions: Yes LLE Weight Bearing: Weight bearing as tolerated Pain: No c/o in session  See Function Navigator for Current Functional Status.   Therapy/Group: Individual Therapy  Roney MansSmith, [name] 02/09/2016, 3:52 PM

## 2016-02-09 NOTE — Progress Notes (Signed)
RT entered room and bandage has been removed from stoma. RT replaced bandage. Pt tolerating well.Pt reminded not to remove bandage.  RN notified

## 2016-02-09 NOTE — Progress Notes (Signed)
Pt trach removed per MD order. Stoma covered with guaze and pink tape. Pt able to verbalize. Sat, 97% on room air. No complications noted.
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
## 2016-02-09 NOTE — Progress Notes (Signed)
Physical Therapy Session Note  Patient Details  Name: [name] MRN: [id number] Date of Birth: 09/07/1970  Today's Date: 02/09/2016 PT Individual Time: [id number] PT Individual Time Calculation (min): 35 min   Short Term Goals: Week 1:  PT Short Term Goal 1 (Week 1): Patient will transfer bed <> wheelchair using LRAD with consistent mod A while maintaining NWB LLE.  PT Short Term Goal 2 (Week 1): Patient will ambulate 15 ft using RW with mod A while maintaining NWB LLE.  PT Short Term Goal 3 (Week 1): Patient will transfer sit <> stand using LRAD with consistent min A while maintaining NWB LLE.  PT Short Term Goal 4 (Week 1): Patient will sustain attention to functional task x 30 min with min cues.  PT Short Term Goal 5 (Week 1): Patient will identify 1 physical and 1 cognitive deficit with mod cues.   Skilled Therapeutic Interventions/Progress Updates:   Pt received in bed asleep; required increased time and total A to arouse/awaken through name calling, touch, turning on lights and raising HOB.  Pt able to transition to EOB with rails, HOB elevated, extra time and min-mod A but kept her eyes closed.  Performed stand pivot bed > recliner with pt UE support on arm rests and therapist providing min A for balance and to maintain NWB LLE.  Once seated in recliner pt reporting urinating; pt with incontinent episode of urine in recliner without a brief on.  Performed multiple sit <> stands with UE support on sink and therapist providing min A to maintain NWB LLE while NT assisted with doffing soiled clothing and donning clean brief and clothing. Pt transitioned to w/c to allow NT to clean recliner.  Once in w/c pt reporting feeling hot and having a headache; RN notified of HA and incontinence.  Pt left in w/c with trial use of quick release belt; RN aware and to assess BP.  Therapy Documentation Precautions:  Precautions Precautions: Fall, Cervical Cervical Brace: At all times, Hard  collar Restrictions Weight Bearing Restrictions: Yes LLE Weight Bearing: Non weight bearing Other Position/Activity Restrictions: LLE NWB 6 weeks Vital Signs: Therapy Vitals Temp: 98.7 F (37.1 C) Temp Source: Oral Pulse Rate: 93 Resp: 16 BP: 138/84 mmHg Patient Position (if appropriate): Lying Oxygen Therapy SpO2: 99 % O2 Device: Not Delivered Pain: Pain Assessment Pain Assessment: Faces Faces Pain Scale: Hurts a little bit Pain Intervention(s): Repositioned;Distraction  See Function Navigator for Current Functional Status.   Therapy/Group: Individual Therapy  [name], Aud[facility] Peak[facility]ette 02/09/2016, 4:29 PM

## 2016-02-09 NOTE — Plan of Care (Signed)
Problem: RH SAFETY Goal: RH STG ADHERE TO SAFETY PRECAUTIONS W/ASSISTANCE/DEVICE STG Adhere to Safety Precautions With Assistance/Device.  Outcome: Not Progressing Impulsive. Soft waist belt while in bed continued for safety.

## 2016-02-10 ENCOUNTER — Inpatient Hospital Stay (HOSPITAL_COMMUNITY): Payer: Self-pay | Admitting: Occupational Therapy

## 2016-02-10 ENCOUNTER — Encounter (HOSPITAL_COMMUNITY): Payer: Self-pay | Admitting: Speech Pathology

## 2016-02-10 DIAGNOSIS — S32810S Multiple fractures of pelvis with stable disruption of pelvic ring, sequela: Secondary | ICD-10-CM

## 2016-02-10 LAB — GLUCOSE, CAPILLARY
GLUCOSE-CAPILLARY: 99 mg/dL (ref 65–99)
Glucose-Capillary: 68 mg/dL (ref 65–99)
Glucose-Capillary: 82 mg/dL (ref 65–99)
Glucose-Capillary: 122 mg/dL — ABNORMAL HIGH (ref 65–99)
Glucose-Capillary: 118 mg/dL — ABNORMAL HIGH (ref 65–99)
Glucose-Capillary: 116 mg/dL — ABNORMAL HIGH (ref 65–99)

## 2016-02-10 MED ORDER — QUETIAPINE FUMARATE 50 MG PO TABS
150.0000 mg | ORAL_TABLET | Freq: Two times a day (BID) | ORAL | Status: DC
  Administered 2016-02-10 – 2016-02-15 (×10): 150 mg
  Filled 2016-02-10 (×11): qty 1

## 2016-02-10 NOTE — Progress Notes (Signed)
SLP Cancellation Note  Patient Details Name: Frederich ChickCrystal D Monsour MRN: [id number] DOB: 11/05/1970   Cancelled treatment:        Pt missed 60 minutes of therapy today secondary to increased agitation and refusal to participate. Will follow.                                                                                                Rocky CraftsKara E Eston Heslin 02/10/2016, 1:12 PM

## 2016-02-10 NOTE — Progress Notes (Signed)
Patient turned off bed alarm, crawled out of soft waist belt, and ambulated to bathroom.  This nurse found patient in shower, bathing self.  Patient states she is going to get the doctor to let her go home today, or she is leaving anyway.  Patient had removed gauze dressing to stoma site.  Supervised patient with shower.  Patient dressed in hospital gown and then refused to get back into bed opting to sit in recliner.  Pink belt applied.  Call bell on table in front of her.  Abdominal binder placed back on patient.    Kelli HopeBarber, Thoams Siefert M

## 2016-02-10 NOTE — Progress Notes (Signed)
Patient up in wheelchair refusing to stay in her room.  Patient states she is waiting for MD to make rounds as she is going to go home today.  Unable to redirect.  Patient states she fell on purpose yesterday "Just to piss y'all off" and " I know you can't keep me here.  I have rights".  Refusing any medications at this time.  States her mother is coming to pick her up this morning.    Kelli HopeBarber, Loreta Blouch M

## 2016-02-10 NOTE — Significant Event (Signed)
Hypoglycemic Event  CBG: 68  Treatment: 15 GM carbohydrate snack  Symptoms: None  Follow-up CBG: WUJW:[id number]:[id number] CBG Result:82  Possible Reasons for Event: Inadequate meal intake  Comments/MD notified: Will let MD know on morning rounds     Kelli HopeBarber, [name]
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
## 2016-02-11 ENCOUNTER — Inpatient Hospital Stay (HOSPITAL_COMMUNITY): Payer: Medicaid Other | Admitting: Physical Therapy

## 2016-02-11 ENCOUNTER — Inpatient Hospital Stay (HOSPITAL_COMMUNITY): Payer: Medicaid Other | Admitting: Occupational Therapy

## 2016-02-11 DIAGNOSIS — S32811S Multiple fractures of pelvis with unstable disruption of pelvic ring, sequela: Secondary | ICD-10-CM

## 2016-02-11 LAB — GLUCOSE, CAPILLARY
GLUCOSE-CAPILLARY: 122 mg/dL — AB (ref 65–99)
GLUCOSE-CAPILLARY: 93 mg/dL (ref 65–99)
GLUCOSE-CAPILLARY: 126 mg/dL — AB (ref 65–99)
Glucose-Capillary: 100 mg/dL — ABNORMAL HIGH (ref 65–99)
Glucose-Capillary: 102 mg/dL — ABNORMAL HIGH (ref 65–99)
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
## 2016-02-11 NOTE — Progress Notes (Signed)
Occupational Therapy Session Note  Patient Details  Name: April ChickCrystal D Jon MRN: [id number] Date of Birth: 06/06/1971  Today's Date: 02/11/2016 OT Individual Time: 1330-1415 OT Individual Time Calculation (min): 45 min    Short Term Goals: Week 1:  OT Short Term Goal 1 (Week 1): Pt will perform toileting with min A in order to increase I with functional tasks.  OT Short Term Goal 2 (Week 1): Pt will perform LB dressing with min A in order to increase I with self care.  OT Short Term Goal 3 (Week 1): Pt will maintain NWB in L LE during functional transfer with min verbal cues for safety. OT Short Term Goal 4 (Week 1): Pt will perform grooming at sink with supervision and min cues for tasks.   Skilled Therapeutic Interventions/Progress Updates: patient participated as follows:     Tub/shower in ADL apt= mod A with patient adhering to LLE NWB precaution;   20 minutes with rest breaks level 2 then 1 on armitron bike.    Patient's significant other Chase PicketHerman present for the session.     Therapy Documentation Precautions:  Precautions Precautions: Fall, Cervical Cervical Brace: At all times, Hard collar Restrictions Weight Bearing Restrictions: Yes LLE Weight Bearing: Non weight bearing Other Position/Activity Restrictions: LLE NWB 6 weeks  Paindenied  See Function Navigator for Current Functional Status.   Therapy/Group: Individual Therapy  Bud Faceickett, Lavanya Roa [facility] 02/11/2016, 3:26 PM

## 2016-02-11 NOTE — Progress Notes (Addendum)
Occupational Therapy Session Note  Patient Details  Name: April ChickCrystal D Hase MRN: [id number] Date of Birth: 01/28/1971  Today's Date: 02/11/2016 OT Individual Time: [id number] OT Individual Time Calculation (min): 60 min    Short Term Goals: Week 1:  OT Short Term Goal 1 (Week 1): Pt will perform toileting with min A in order to increase I with functional tasks.  OT Short Term Goal 2 (Week 1): Pt will perform LB dressing with min A in order to increase I with self care.  OT Short Term Goal 3 (Week 1): Pt will maintain NWB in L LE during functional transfer with min verbal cues for safety. OT Short Term Goal 4 (Week 1): Pt will perform grooming at sink with supervision and min cues for tasks.   Skilled Therapeutic Interventions/Progress Updates: Nursing combing out tangles iat nursing station upon approach for therapy.   She completed bathing and dressing and toileting with min A for transfers and donning 2 hospital gowns, but though she stated, "I am not supposed to walk,"   She walked and was unable to maintain NWB in L LE   patient was left in recliner at nurses station with safety belt in place at end of session.  Patient complains that she is hypersensitve to all touch.   She says anyone touching her hurts.     Therapy Documentation Precautions:  Precautions Precautions: Fall, Cervical Cervical Brace: At all times, Hard collar Restrictions Weight Bearing Restrictions: Yes LLE Weight Bearing: Non weight bearing Other Position/Activity Restrictions: LLE NWB 6 weeks Pain: patient stated, "It hurts for anybody to touch me anywhere.   And them combing out my tangles are hurting too bad.   I need a break from that."  See Function Navigator for Current Functional Status.   Therapy/Group: Individual Therapy  Bud Faceickett, Vincenta Steffey [facility] 02/11/2016, 9:07 AM

## 2016-02-11 NOTE — Progress Notes (Signed)
Physical Therapy Session Note  Patient Details  Name: [name] MRN: [id number] Date of Birth: 12/09/1970  Today's Date: 02/11/2016 PT Individual Time: 1000-1100 and 1300-1330 PT Individual Time Calculation (min): 60 min and 30 min  Short Term Goals: Week 1:  PT Short Term Goal 1 (Week 1): Patient will transfer bed <> wheelchair using LRAD with consistent mod A while maintaining NWB LLE.  PT Short Term Goal 2 (Week 1): Patient will ambulate 15 ft using RW with mod A while maintaining NWB LLE.  PT Short Term Goal 3 (Week 1): Patient will transfer sit <> stand using LRAD with consistent min A while maintaining NWB LLE.  PT Short Term Goal 4 (Week 1): Patient will sustain attention to functional task x 30 min with min cues.  PT Short Term Goal 5 (Week 1): Patient will identify 1 physical and 1 cognitive deficit with mod cues.   Skilled Therapeutic Interventions/Progress Updates:   Treatment 1: Session focused on safety awareness, intellectual > emergent awareness, attention to task, command following, and relaxation techniques with functional mobility training. Patient in recliner at RN station. Patient hyperverbal and tangential throughout session, perseverative on parents leaving country, needing to help son, and husband coming to visit, not being able to eat salad, as well as need to leave hospital. Reinforced education regarding current diet and swallowing precautions, maintaining NWB precautions, safety in room, and following commands in order to meet goals and DC home safely. Patient required max-total multimodal cues for safety and compliance with NWB LLE with transfers and short distance ambulation due to impulsivity. Patient instructed in sit <> stand using RW with max cues for safe hand placement and technique and gait training using RW x 5 ft + 10 ft with min A overall and max verbal cues to maintain precautions. Patient c/o neck and shoulder pain, provided supine rest break due to  increased anxiety and instructed in deep breathing techniques. Patient remained seated for rest of session due to pain. Performed 2 mildly complex pipetree puzzles with max verbal/visual cues for attention, sequencing, problem solving, and error recognition and correction with increased time. Patient easily frustrated with task requiring max encouragement. Engaged in tabletop task with focus on attention and command following with min multimodal cues overall. Patient left sitting in recliner with quick release belt on at RN station.   Treatment 2:  Patient in day room with visitor present who patient introduces to therapist as her husband. Session focused on safety awareness for current diet, PEG removal, and NWB LLE status x 6 weeks as patient requesting an "electric" wheelchair, sit <> stand transfers using RW with focus on hand placement and NWB LLE, gait training using RW x 20 ft + 30 ft with min A overall and patient able to maintain NWB status, and stand pivot transfer wheelchair <> commode using grab bar with patient non-compliant with WB status. Patient washed hands and brushed teeth from wheelchair with supervision and left sitting in wheelchair with chair belt engaged with visitor in day room to await OT session.   Therapy Documentation Precautions:  Precautions Precautions: Fall, Cervical Cervical Brace: At all times, Hard collar Restrictions Weight Bearing Restrictions: Yes LLE Weight Bearing: Non weight bearing Other Position/Activity Restrictions: LLE NWB 6 weeks Pain: Pain Assessment Pain Assessment: 0-10 Pain Score: 10-Worst pain ever Pain Type: Acute pain Pain Location: Shoulder Pain Orientation: Right;Left Pain Descriptors / Indicators: Aching Pain Onset: On-going Pain Intervention(s): Repositioned;Rest   See Function Navigator for Current Functional Status.  Therapy/Group: Individual Therapy  Kerney ElbeVarner, Ahmani Daoud A 02/11/2016, 7:46 AM

## 2016-02-11 NOTE — Progress Notes (Signed)
Attempting mat removal from patients hair.  While combing, patient felt lump in head and was concerned it was a tick.  RN examined to find a small piece of glass approximately pea sized sticking into scalp.  RN removed glass with tweezers, no bleeding noted from extraction area.  Dani Gobbleeardon, Dore Oquin J, RN

## 2016-02-12 ENCOUNTER — Encounter (HOSPITAL_COMMUNITY): Payer: Self-pay

## 2016-02-12 ENCOUNTER — Inpatient Hospital Stay (HOSPITAL_COMMUNITY): Payer: Medicaid Other | Admitting: Speech Pathology

## 2016-02-12 ENCOUNTER — Inpatient Hospital Stay (HOSPITAL_COMMUNITY): Payer: Self-pay | Admitting: Physical Therapy

## 2016-02-12 ENCOUNTER — Inpatient Hospital Stay (HOSPITAL_COMMUNITY): Payer: Self-pay | Admitting: Occupational Therapy

## 2016-02-12 DIAGNOSIS — R451 Restlessness and agitation: Secondary | ICD-10-CM | POA: Insufficient documentation

## 2016-02-12 DIAGNOSIS — S3210XA Unspecified fracture of sacrum, initial encounter for closed fracture: Secondary | ICD-10-CM | POA: Insufficient documentation

## 2016-02-12 DIAGNOSIS — R609 Edema, unspecified: Secondary | ICD-10-CM

## 2016-02-12 DIAGNOSIS — R450 Nervousness: Secondary | ICD-10-CM

## 2016-02-12 DIAGNOSIS — R000 Tachycardia, unspecified: Secondary | ICD-10-CM

## 2016-02-12 DIAGNOSIS — S3210XS Unspecified fracture of sacrum, sequela: Secondary | ICD-10-CM

## 2016-02-12 LAB — GLUCOSE, CAPILLARY
GLUCOSE-CAPILLARY: 101 mg/dL — AB (ref 65–99)
Glucose-Capillary: 107 mg/dL — ABNORMAL HIGH (ref 65–99)
Glucose-Capillary: 124 mg/dL — ABNORMAL HIGH (ref 65–99)
Glucose-Capillary: 104 mg/dL — ABNORMAL HIGH (ref 65–99)

## 2016-02-12 MED ORDER — ALUM & MAG HYDROXIDE-SIMETH 200-200-20 MG/5ML PO SUSP
30.0000 mL | Freq: Four times a day (QID) | ORAL | Status: DC | PRN
  Administered 2016-02-13 – 2016-02-15 (×6): 30 mL via ORAL
  Filled 2016-02-12 (×9): qty 30

## 2016-02-12 MED ORDER — PROPRANOLOL HCL 10 MG PO TABS
10.0000 mg | ORAL_TABLET | Freq: Three times a day (TID) | ORAL | Status: DC
  Administered 2016-02-12 – 2016-02-13 (×4): 10 mg via ORAL
  Filled 2016-02-12 (×4): qty 1

## 2016-02-12 NOTE — Progress Notes (Signed)
Physical Therapy Session Note  Patient Details  Name: [name] MRN: [id number] Date of Birth: 03/08/1971  Today's Date: 02/12/2016 PT Individual Time: 0900-1000 PT Individual Time Calculation (min): 60 min   Short Term Goals: Week 1:  PT Short Term Goal 1 (Week 1): Patient will transfer bed <> wheelchair using LRAD with consistent mod A while maintaining NWB LLE.  PT Short Term Goal 2 (Week 1): Patient will ambulate 15 ft using RW with mod A while maintaining NWB LLE.  PT Short Term Goal 3 (Week 1): Patient will transfer sit <> stand using LRAD with consistent min A while maintaining NWB LLE.  PT Short Term Goal 4 (Week 1): Patient will sustain attention to functional task x 30 min with min cues.  PT Short Term Goal 5 (Week 1): Patient will identify 1 physical and 1 cognitive deficit with mod cues.   Skilled Therapeutic Interventions/Progress Updates:   Pt hyper-verbal today and restless.  Pt upset about enclosure bed, getting her breakfast late, why she can't walk with her LLE and not having a bath yet.  Pt noted to be confabulating on aspects of situation; "everyone else got their breakfast 6:30, mine wasn't here until 10 am!" and "I don't think my leg is broken, it doesn't even hurt, I could hop on it and it wouldn't hurt!".   Due to pt being very restless, assessed vitals: 152/112, RN notified.  Attempted to re-orient, re-direct pt by noting current time and writing her schedule down for her so she would know when to expect OT while awaiting RN to provide medication for elevated BP.  Pt reporting need to urinate.  Transferred from arm chair > w/c <> stand pivot with RW and grab bars and min A with verbal cues to maintain NWB LLE; pt able to keep LLE elevated during pivot.  Pt performed clothing doffing and hygiene with toileting but required assistance to don clothing.  Throughout toileting and hand hygiene pt continued to be restless and upset that she has to stay in the hospital, that  she is ready to go home, she can do everything for herself, she is going to her mom's house where her family can take care of her and her ex-husband will also be helping and will carry her into the house.  Pt also requesting to speak to MD or social worker to find out when she can leave.  Attempted to calm pt by discussing areas to continue to practice and focus on to determine if she is ready to D/C.  Pt agreeable to practice car and regular bed transfers.  Pt reports her ex-husband has bought all her equipment for, "$6,000."  Pt transported to gym in w/c.  Performed stand pivot transfers w/c <> car simulator with UE support on door, min A and verbal cues to sequence.  Also performed ambulation in ADL apartment 10' bed <> w/c with RW and min A over carpet with verbal cues to safely negotiate around furniture with RW; pt able to maintain NWB.  Pt performed supine <> sit on flat bed with supervision.  Returned to w/c and to room in w/c.  Continued to attempt to calm pt by re-assuring her that the team was working towards her D/C and that before she could go home with family they would have to agree to come in and practice all mobility skills with therapy.  Pt able to be re-directed for short amount of time but then would become focused on leaving again.  Pt still presents with impaired awareness of deficits and safety awareness.  Pt left in w/c with alarm belt in place still requesting to speak with her social worker about leaving.  Alerted Child psychotherapistsocial worker to pt's request; social worker to discuss D/C plan with pt.    Therapy Documentation Precautions:  Precautions Precautions: Fall, Cervical Cervical Brace: At all times, Hard collar Restrictions Weight Bearing Restrictions: Yes LLE Weight Bearing: Non weight bearing Other Position/Activity Restrictions: LLE NWB 6 weeks Vital Signs: Therapy Vitals Pulse Rate: (!) 101 BP: (!) 146/92 mmHg Patient Position (if appropriate): Sitting Pain:  No c/o  pain   See Function Navigator for Current Functional Status.   Therapy/Group: Individual Therapy  [name], [name] 02/12/2016, 12:19 PM

## 2016-02-12 NOTE — Progress Notes (Signed)
Occupational Therapy Note  Patient Details  Name: April ChickCrystal D Loren MRN: [id number] Date of Birth: 12/02/1970  Today's Date: 02/12/2016 OT Individual Time: 1300-1330 OT Individual Time Calculation (min): 30 min   Pt denied pain Individual therapy  Pt resting in w/c with QRB/alarm in place, talking on telephone (the cord was wrapped around the w/c and pt). Focus on cognitive remediation including safety precautions, WBing precautions, purpose of rehab, and overall safety awareness.  RN entered room to admin meds and pt engaged Charity fundraiserN in conversation regarding QRB.  RN had earlier entered into agreement that she would allow pt to remain in w/c without QRB if she would call for assistance.  RN entered room approx 15 mins later and pt had returned to bed.  Pt stated that she forgot.  Pt stated that if "you guys dont' let me go home soon I'm going to walk out of here." Pt stated she didn't know why she couldn't put weight through her LLE since it didn't hurt and "nothing is broken." Reeducated pt on medical status.  Pt verbalized understanding but continued to verbalize that she didn't understand why she couldn't stand on her LLE.  Pt remained in w/c with QRB/alarm in place.    Lavone NeriLanier, Regino Fournet [facility] 02/12/2016, 2:40 PM

## 2016-02-12 NOTE — Progress Notes (Signed)
Speech Language Pathology Daily Session Note  Patient Details  Name: [name] MRN: [id number] Date of Birth: 04/10/1971  Today's Date: 02/12/2016 SLP Individual Time: [id number] SLP Individual Time Calculation (min): 45 min  Short Term Goals: Week 1: SLP Short Term Goal 1 (Week 1): Patient will tolerate PMSV during all waking hours with vitals remaining WFL with Mod I.  SLP Short Term Goal 2 (Week 1): Patient will maximize speech intelligibility to ~75% at the sentence level with Min verbal cues for use of over-articulation and an increased vocal intensity.  SLP Short Term Goal 3 (Week 1): Patient will consume trials of thin liquids with minimal overt s/s of aspiration to determine readiness for MBS SLP Short Term Goal 4 (Week 1): Patient will demonstrate sustained attention to functional tasks for 30 minutes with Min A verbal cues for redirection.  SLP Short Term Goal 5 (Week 1): Patient will identify 2 cognitive and 2 physical deficits with Mod A multimodal cues.  SLP Short Term Goal 6 (Week 1): Patient will utilize call bell to request assistance in 25% of opportunities with Max A multimodal cues.   Skilled Therapeutic Interventions: Skilled treatment session focused on dysphagia and cognition goals. Pt was verbally preseverative on being discharged and people lying to her about her discharge plans and her current precautions for C Diff. SLP attempted to provide education on plan of care, daily schedule/plan for shower this day and status of C Diff. Pt unable to redirected. Pt consumed trials of regular with impulsive rate and multiple sips per thins. Pt given mod A verbal cues but unable to carry over strategies. Pt consumed without s/s of aspiration. Pt impulsively picked up phone twice during session and called her mother, making agitated requests for her mother to come get her. Pt was not responsive to cues for redirection tot therapy tasks. Pt required Mod A verbal cues for sequencing  tasks and Mod A verbal cues to use call bell on two opportunities. Pt unable to recall physical deficits (nonweight bearing on left leg) and unable to implement with Max A verbal and physical cues when transferring to toilet. PT asked ST if she was safe to go home, ST provided education to pt that pt was not safe to go home and further therapy was indicated. Pt left with safety belt in place, nursing entering room and all needs within reach. Continue current plan of care.   Function:  Eating Eating   Modified Consistency Diet: No Eating Assist Level: Supervision or verbal cues           Cognition Comprehension Comprehension assist level: Understands basic 75 - 89% of the time/ requires cueing 10 - 24% of the time  Expression   Expression assist level: Expresses basic 90% of the time/requires cueing < 10% of the time.  Social Interaction Social Interaction assist level: Interacts appropriately 25 - 49% of time - Needs frequent redirection.  Problem Solving Problem solving assist level: Solves basic 50 - 74% of the time/requires cueing 25 - 49% of the time  Memory Memory assist level: Recognizes or recalls 25 - 49% of the time/requires cueing 50 - 75% of the time    Pain    Therapy/Group: Individual Therapy  [name] 02/12/2016, 12:29 PM

## 2016-02-12 NOTE — Progress Notes (Signed)
Occupational Therapy Session Note  Patient Details  Name: [name] MRN: [id number] Date of Birth: 06/24/1971  Today's Date: 02/12/2016 OT Individual Time: 1400-1500 OT Individual Time Calculation (min): 60 min   Short Term Goals: Week 1:  OT Short Term Goal 1 (Week 1): Pt will perform toileting with min A in order to increase I with functional tasks.  OT Short Term Goal 2 (Week 1): Pt will perform LB dressing with min A in order to increase I with self care.  OT Short Term Goal 3 (Week 1): Pt will maintain NWB in L LE during functional transfer with min verbal cues for safety. OT Short Term Goal 4 (Week 1): Pt will perform grooming at sink with supervision and min cues for tasks.   Skilled Therapeutic Interventions/Progress Updates:  Upon entering room, pt found seated in w/c with family (mother, aunt, granddaughter) present. Pt with complaints of pain in back and neck, no rate given. Pt's family eager for patient to get into shower, therapist covered trach site and assisted pt onto shower seat in walk-in shower. Pt required max verbal cueing and facilitation not to weight bear through LLE. Pt completed UB/LB bathing in seated position with max cueing for safety, initiation, problem solving, attention, awareness. Pt dried seated on seat and completed UB/LB dressing in sit to/from stand position from seat and required max cueing & facilitation for NWB to LLE. Therapist assisted pt out of shower and aunt assisted pt with hair care, pt with decreased awareness and attention to complete hair care at this time. Pt stated they plan to stay with patient in room for awhile, discussed importance of them letting nursing staff know when and if they leave. Pt left seated in w/c with family present and chair alarm/belt in place. Pt with increased frustration during session, yelling at therapist and family. Pt required max re-direction to stay on task and appropriately communicate with staff and family.    Therapy Documentation Precautions:  Precautions Precautions: Fall, Cervical Cervical Brace: At all times, Hard collar Restrictions Weight Bearing Restrictions: Yes LLE Weight Bearing: Non weight bearing Other Position/Activity Restrictions: LLE NWB 6 weeks  See Function Navigator for Current Functional Status.  Therapy/Group: Individual Therapy  Edwin CapPatricia Thomson Herbers , MS, OTR/L, CLT  02/12/2016, 3:42 PM
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
## 2016-02-13 ENCOUNTER — Inpatient Hospital Stay (HOSPITAL_COMMUNITY): Payer: Self-pay | Admitting: Speech Pathology

## 2016-02-13 ENCOUNTER — Inpatient Hospital Stay (HOSPITAL_COMMUNITY): Payer: Self-pay | Admitting: Physical Therapy

## 2016-02-13 ENCOUNTER — Inpatient Hospital Stay (HOSPITAL_COMMUNITY): Payer: Self-pay | Admitting: Occupational Therapy

## 2016-02-13 DIAGNOSIS — F419 Anxiety disorder, unspecified: Secondary | ICD-10-CM

## 2016-02-13 DIAGNOSIS — Z9189 Other specified personal risk factors, not elsewhere classified: Secondary | ICD-10-CM

## 2016-02-13 DIAGNOSIS — R451 Restlessness and agitation: Secondary | ICD-10-CM

## 2016-02-13 LAB — GLUCOSE, CAPILLARY
GLUCOSE-CAPILLARY: 173 mg/dL — AB (ref 65–99)
Glucose-Capillary: 100 mg/dL — ABNORMAL HIGH (ref 65–99)
Glucose-Capillary: 92 mg/dL (ref 65–99)
Glucose-Capillary: 81 mg/dL (ref 65–99)

## 2016-02-13 MED ORDER — PROPRANOLOL HCL 20 MG PO TABS
20.0000 mg | ORAL_TABLET | Freq: Three times a day (TID) | ORAL | Status: DC
  Administered 2016-02-13 – 2016-02-15 (×6): 20 mg via ORAL
  Filled 2016-02-13 (×6): qty 1

## 2016-02-13 MED ORDER — NICOTINE 14 MG/24HR TD PT24
14.0000 mg | MEDICATED_PATCH | Freq: Every day | TRANSDERMAL | Status: DC
  Administered 2016-02-13 – 2016-02-19 (×7): 14 mg via TRANSDERMAL
  Filled 2016-02-13 (×7): qty 1

## 2016-02-13 MED ORDER — RISPERIDONE 1 MG PO TABS
1.0000 mg | ORAL_TABLET | Freq: Two times a day (BID) | ORAL | Status: DC
  Administered 2016-02-13 – 2016-02-16 (×7): 1 mg via ORAL
  Filled 2016-02-13 (×8): qty 1

## 2016-02-13 MED ORDER — QUETIAPINE FUMARATE 50 MG PO TABS
50.0000 mg | ORAL_TABLET | Freq: Two times a day (BID) | ORAL | Status: DC | PRN
  Administered 2016-02-13 – 2016-02-14 (×2): 50 mg via ORAL
  Filled 2016-02-13 (×5): qty 1

## 2016-02-13 NOTE — Progress Notes (Signed)
Social Work Patient ID: [name], female   DOB: 03/15/1971, 45 y.o.   MRN: [id number]   Pt with increasing agitation since the weekend.  Insisting yesterday to speak with me and stating that she wants to go home.  I contacted her mother who arrived on the unit in the afternoon and we attempted to talk with pt together. Of note, at time of admission to CIR, the d/c plan was for SNF.  Mother now says that she is willing to have pt come home with her "but not when she's acting like that."  Pt yelling at mother and telling her to tell me that she can come home.  Mother very unsuccessful at calming pt.   Pt eventually agrees to "stay 10 days" on CIR and we ended discussion.   Informed by RN upon my arrival today that pt highly agitated this morning and making attempts to leave unit.  Security involved and pt placed in enclosure bed with sitter present.  MD adjusting medications.  May need to have a behavioral plan meeting with team.  Continue to follow and work with team on care plan with hopes that pt's behavior improves and she can d/c home with mother.  April Cieslik, LCSW

## 2016-02-13 NOTE — Progress Notes (Signed)
SLP Cancellation Note  Patient Details Name: [name] MRN: [id number] DOB: 11/12/1970   Cancelled treatment:    Pt's treatment cancelled as pt with increased agitation and unable to participate in skilled tx this day.                                                                                                [name] 02/13/2016, 11:29 AM

## 2016-02-13 NOTE — Progress Notes (Addendum)
Occupational Therapy Note  Patient Details  Name: Frederich ChickCrystal D Harshbarger MRN: [id number] Date of Birth: 12/01/1970  Today's Date: 02/13/2016 OT Missed Time: 60 Minutes Missed Time Reason: Other (comment) (Pt combative and non-compliant)  Scheduling team aware of missed time.   Edwin CapPatricia Wymon Swaney , MS, OTR/L, CLT  02/13/2016, 10:19 AM
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
## 2016-02-13 NOTE — Progress Notes (Signed)
0715:  Pt found ambulating in hall, carrying belongings in bag, states 'I'm going home, my mom is downstairs waiting." Attempted to talk to pt and guide her back to room, pt became more belligerent and aggressive, combative. Security called. Pt ambulated to elevators, attempting to leave when security arrived. Pt walked back to her room, continuing to yell out loudly. Security unable to provide assistance getting pt into enclosure bed. Pt agreed to stay in room if able to call her mother. Dr. Patel at bedside. Medications given asAllena Katz per orders.  After pt called her mother, she again attempted to elope; got on elevator and when access restricted, laid down in elevator, continuing to yell out, combative. Other pts and family members present.  Wheelchair obtained and pt assisted to room and then to enclosure bed. Pt yelling out loudly,kicking and clawing at enclosure bed mesh.  House supervisor Tana Conchon Flack aware of situation and that limited unit staff members were available to assist. Sitter arrived for immediate supervision and assistance.  Pt assisted out of enclosure bed and to  recliner as yelling out into hall was disruptive and disconcerting to other patients on unit.  Slept at intervals approx. 3-4 hours. Sitter D/Ced at 245. At 1500  pt. stated, "I'm leaving after dinner. You can't keep me here."  Pt. loud, escalating, pacing in room. Order obtained for additional PRN dose of risperdall 50mg  , given along with pain med.  Pt's. cousin arrived at [id number] to stay with patient.  Pt. verbalized with total accuracy the events of the morning. 1915.Marland Kitchen.Report to night shift. Pt quiet, visiting with cousin in room.  Smantha Boakye,RN.

## 2016-02-13 NOTE — Progress Notes (Signed)
Physical Therapy Note  Patient Details  Name: Frederich ChickCrystal D Coreas MRN: [id number] Date of Birth: 03/01/1971 Today's Date: 02/13/2016  Patient missed 90 min skilled physical therapy due to increased agitation/unwillingness to participate. Will f/u as able.    Bayard HuggerVarner, Krishang Reading A 02/13/2016, 8:13 AM

## 2016-02-14 ENCOUNTER — Inpatient Hospital Stay (HOSPITAL_COMMUNITY): Payer: Medicaid Other | Admitting: Occupational Therapy

## 2016-02-14 ENCOUNTER — Inpatient Hospital Stay (HOSPITAL_COMMUNITY): Payer: Medicaid Other | Admitting: Physical Therapy

## 2016-02-14 ENCOUNTER — Inpatient Hospital Stay (HOSPITAL_COMMUNITY): Payer: Self-pay | Admitting: Speech Pathology

## 2016-02-14 DIAGNOSIS — F3110 Bipolar disorder, current episode manic without psychotic features, unspecified: Secondary | ICD-10-CM

## 2016-02-14 LAB — CBC WITH DIFFERENTIAL/PLATELET
Basophils Absolute: 0 10*3/uL (ref 0.0–0.1)
Basophils Relative: 0 %
EOS ABS: 0.4 10*3/uL (ref 0.0–0.7)
EOS PCT: 6 %
HCT: 38.9 % (ref 36.0–46.0)
Hemoglobin: 12.2 g/dL (ref 12.0–15.0)
LYMPHS ABS: 2.2 10*3/uL (ref 0.7–4.0)
Lymphocytes Relative: 36 %
MCH: 28.6 pg (ref 26.0–34.0)
MCHC: 31.4 g/dL (ref 30.0–36.0)
MCV: 91.1 fL (ref 78.0–100.0)
MONOS PCT: 7 %
Monocytes Absolute: 0.4 10*3/uL (ref 0.1–1.0)
Neutro Abs: 3.2 10*3/uL (ref 1.7–7.7)
Neutrophils Relative %: 51 %
PLATELETS: 247 10*3/uL (ref 150–400)
RBC: 4.27 MIL/uL (ref 3.87–5.11)
RDW: 14.7 % (ref 11.5–15.5)
WBC: 6.2 10*3/uL (ref 4.0–10.5)

## 2016-02-14 LAB — BASIC METABOLIC PANEL
ANION GAP: 9 (ref 5–15)
BUN: 9 mg/dL (ref 6–20)
CALCIUM: 9.7 mg/dL (ref 8.9–10.3)
CHLORIDE: 101 mmol/L (ref 101–111)
CO2: 28 mmol/L (ref 22–32)
CREATININE: 0.5 mg/dL (ref 0.44–1.00)
GFR calc non Af Amer: >60 mL/min (ref >60)
Glucose, Bld: 117 mg/dL — ABNORMAL HIGH (ref 65–99)
Potassium: 3.9 mmol/L (ref 3.5–5.1)
SODIUM: 138 mmol/L (ref 135–145)

## 2016-02-14 LAB — GLUCOSE, CAPILLARY: GLUCOSE-CAPILLARY: 120 mg/dL — AB (ref 65–99)

## 2016-02-14 MED ORDER — DIVALPROEX SODIUM 500 MG PO DR TAB
500.0000 mg | DELAYED_RELEASE_TABLET | Freq: Two times a day (BID) | ORAL | Status: DC
  Administered 2016-02-14: 500 mg via ORAL
  Filled 2016-02-14: qty 1

## 2016-02-14 MED ORDER — DIVALPROEX SODIUM 500 MG PO DR TAB
500.0000 mg | DELAYED_RELEASE_TABLET | Freq: Three times a day (TID) | ORAL | Status: DC
  Administered 2016-02-14: 500 mg via ORAL
  Filled 2016-02-14 (×2): qty 1

## 2016-02-14 MED ORDER — DIVALPROEX SODIUM 125 MG PO CSDR
500.0000 mg | DELAYED_RELEASE_CAPSULE | Freq: Three times a day (TID) | ORAL | Status: DC
  Administered 2016-02-14 – 2016-02-19 (×13): 500 mg via ORAL
  Filled 2016-02-14 (×16): qty 4

## 2016-02-14 NOTE — Progress Notes (Signed)
Physical Therapy Weekly Progress Note  Patient Details  Name: [name] MRN: [id number] Date of Birth: 03/15/1971  Beginning of progress report period: February 06, 2016 End of progress report period: February 15, 2016  Today's Date: 02/15/2016 PT Individual Time: 1300-1415 PT Individual Time Calculation (min): 75 min   Patient has met 3 of 5 short term goals. Patient currently demonstrating behaviors consistent with [place] Level [place]. Patient is able to maintain NWB LLE for squat and scoot transfers but with decreased adherence to WB precautions with standing/clothing management despite max multimodal cues. Patient agreeable to discharge home at wheelchair level due to increased shoulder pain with use of RW. Patient requires frequent redirection and cues for decreasing pace to increase comprehension and minimize frustration. Patient and family training ongoing.   Patient continues to demonstrate the following deficits: muscle weakness pain, decreased cardiorespiratory endurance, decreased attention, decreased awareness, decreased problem solving, decreased safety awareness, decreased memory and delayed processing, decreased standing balance, decreased postural control, decreased balance strategies and difficulty maintaining precautions and therefore will continue to benefit from skilled PT intervention to enhance overall performance with activity tolerance, balance, postural control, ability to compensate for deficits, functional use of  right upper extremity, right lower extremity and left upper extremity, attention, awareness, coordination and knowledge of precautions.  Patient progressing toward long term goals.  Plan of care revisions: Ambulation/standing goals DC'd due to NWB LLE and increased shoulder pain.  PT Short Term Goals Week 1:  PT Short Term Goal 1 (Week 1): Patient will transfer bed <> wheelchair using LRAD with consistent mod A while maintaining NWB LLE.  PT Short Term Goal 1 -  Progress (Week 1): Met PT Short Term Goal 2 (Week 1): Patient will ambulate 15 ft using RW with mod A while maintaining NWB LLE.  PT Short Term Goal 2 - Progress (Week 1): Met PT Short Term Goal 3 (Week 1): Patient will transfer sit <> stand using LRAD with consistent min A while maintaining NWB LLE.  PT Short Term Goal 3 - Progress (Week 1): Met PT Short Term Goal 4 (Week 1): Patient will sustain attention to functional task x 30 min with min cues.  PT Short Term Goal 4 - Progress (Week 1): Not met PT Short Term Goal 5 (Week 1): Patient will identify 1 physical and 1 cognitive deficit with mod cues.  PT Short Term Goal 5 - Progress (Week 1): Progressing toward goal Week 2:  PT Short Term Goal 1 (Week 2): = LTGs due to anticipated LOS  Skilled Therapeutic Interventions/Progress Updates:   Patient in recliner eating sandwich with c/o acid reflux and patient's sister and nieces present for session. Session focused on squat pivot transfers x 3 with emphasis on safe wheelchair setup and maintaining NWB LLE with supervision, stand pivot transfers wheelchair <> commode x 2 using grab bar with max multimodal cues to maintain NWB LLE and to decrease pace with clothing management for safety, wheelchair propulsion using BUE throughout rehab unit with min verbal/visual cues for steering with improved turning skills and efficiency of technique with supervision, seated edge of mat alternating LE ball kicks to therapist with dual cognitive task requiring max cues for recalling next letters in alphabet and min > mod cues for naming food items beginning with letters of alphabet, and engaged in seated tabletop task to address attention and problem solving with mod verbal cues overall and encouragement due to decreased frustration tolerance. Patient left sitting in wheelchair with RN present. Patient  pleased with having DC date from therapy team and CSW and cooperative throughout session.   Therapy  Documentation Precautions:  Precautions Precautions: Fall, Cervical Cervical Brace: At all times, Hard collar Restrictions Weight Bearing Restrictions: Yes LLE Weight Bearing: Non weight bearing Other Position/Activity Restrictions: LLE NWB 6 weeks Pain: Pain Assessment Pain Assessment: 0-10 Pain Score: 10-Worst pain ever Pain Type: Acute pain Pain Location: Neck Pain Orientation: Posterior Pain Descriptors / Indicators: Aching Pain Onset: On-going Pain Intervention(s): Repositioned;Rest  See Function Navigator for Current Functional Status.  Therapy/Group: Individual Therapy  Laretta Alstrom 02/15/2016, 7:50 AM
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
## 2016-02-14 NOTE — Progress Notes (Addendum)
Physical Therapy Session Note  Patient Details  Name: [name] MRN: [id number] Date of Birth: 01/16/1971  Today's Date: 02/14/2016 PT Individual Time: [id number] PT Individual Time Calculation (min): 55 min   Short Term Goals: Week 1:  PT Short Term Goal 1 (Week 1): Patient will transfer bed <> wheelchair using LRAD with consistent mod A while maintaining NWB LLE.  PT Short Term Goal 2 (Week 1): Patient will ambulate 15 ft using RW with mod A while maintaining NWB LLE.  PT Short Term Goal 3 (Week 1): Patient will transfer sit <> stand using LRAD with consistent min A while maintaining NWB LLE.  PT Short Term Goal 4 (Week 1): Patient will sustain attention to functional task x 30 min with min cues.  PT Short Term Goal 5 (Week 1): Patient will identify 1 physical and 1 cognitive deficit with mod cues.   Skilled Therapeutic Interventions/Progress Updates:    Pt received in handoff from PT; pt reports shoulder pain but does not rate & RN aware. Pt assembled pipe tree shapes with minimal cuing to select appropriate size pieces. Pt requested to utilized BUE ergometer & did so x 6 minutes on Level 1 with rest breaks as needed 2/2 fatigue/pain. Pt self propelled w/c x 150 ft with BUE & supervision down hall & through day room. Pt returned to room & reported need to use restroom. Transported pt in bathroom via w/c total A & pt completed stand pivot transfer w/c<>toilet (+ void) with armrests & grab bars while weight bearing through BLE even with maximum multimodal cuing & education on NWB LLE.  Pt states "I'm not putting any weight through it" while standing on BLE. Pt performed hand hygiene at w/c level & PT provided total A for w/c set up by recliner. Pt completed stand pivot w/c>recliner in same manner as noted prior. Pt agreeable to BLE exercises & completed BLE hip adduction with pillow & long arc quads. Throughout session pt required encouragement to participate as pt requested multiple times  that therapist come back later to allow her to spend time with her mother but mother present throughout entire session. At end of session pt left in recliner with mother & RN present.  Therapy Documentation Precautions:  Precautions Precautions: Fall, Cervical Cervical Brace: At all times, Hard collar Restrictions Weight Bearing Restrictions: Yes LLE Weight Bearing: Non weight bearing (non-compliant) Other Position/Activity Restrictions: LLE NWB 6 weeks   See Function Navigator for Current Functional Status.   Therapy/Group: Individual Therapy  Sandi MariscalVictoria M Miller 02/14/2016, 12:57 PM
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
## 2016-02-14 NOTE — Progress Notes (Signed)
Speech Language Pathology Weekly Progress and Session Note  Patient Details  Name: [name] MRN: [id number] Date of Birth: 06-09-1971  Beginning of progress report period: February 06, 2016 End of progress report period: February 14, 2016  Today's Date: 02/14/2016 SLP Individual Time: 0800-0900 SLP Individual Time Calculation (min): 60 min  Short Term Goals: Week 1: SLP Short Term Goal 1 (Week 1): Patient will tolerate PMSV during all waking hours with vitals remaining WFL with Mod I.  SLP Short Term Goal 1 - Progress (Week 1): Met SLP Short Term Goal 2 (Week 1): Patient will maximize speech intelligibility to ~75% at the sentence level with Min verbal cues for use of over-articulation and an increased vocal intensity.  SLP Short Term Goal 2 - Progress (Week 1): Met SLP Short Term Goal 3 (Week 1): Patient will consume trials of thin liquids with minimal overt s/s of aspiration to determine readiness for MBS SLP Short Term Goal 3 - Progress (Week 1): Met SLP Short Term Goal 4 (Week 1): Patient will demonstrate sustained attention to functional tasks for 30 minutes with Min A verbal cues for redirection.  SLP Short Term Goal 4 - Progress (Week 1): Partly met SLP Short Term Goal 5 (Week 1): Patient will identify 2 cognitive and 2 physical deficits with Mod A multimodal cues.  SLP Short Term Goal 5 - Progress (Week 1): Met SLP Short Term Goal 6 (Week 1): Patient will utilize call bell to request assistance in 25% of opportunities with Max A multimodal cues.  SLP Short Term Goal 6 - Progress (Week 1): Met    New Short Term Goals: Week 2: SLP Short Term Goal 1 (Week 2): Patient will demonstrate sustained attention to functional tasks for 30 minutes with Min A verbal cues for redirection.  SLP Short Term Goal 2 (Week 2): Pt will complete functional reasoning tasks with min A. SLP Short Term Goal 3 (Week 2): Pt will complete functional math tasks with min A. SLP Short Term Goal 4 (Week 2): Pt  will tolerate regular textures without s/s aspiration to demonstrate readiness for diet upgrade.  Weekly Progress Updates:  Pt has met 4/5 STGs and is now tolerating a Dys 2 diet with thin liquids. Primary barrier to pt's ability to perform cognitive-linguistic tasks independently is pt's inability to self-monitor and slow pace to absorb all necessary information. Will continue to address and work toward diet upgrade.        Intensity: Minumum of 1-2 x/day, 30 to 90 minutes Frequency: 3 to 5 out of 7 days Duration/Length of Stay: 3 weeks  Treatment/Interventions: Cognitive remediation/compensation;Cueing hierarchy;Dysphagia/aspiration precaution training;Environmental controls;Internal/external aids;Patient/family education;Therapeutic Activities;Speech/Language facilitation;Functional tasks   Daily Session  Skilled Therapeutic Interventions: Pt seen for cognitive-linguistic therapy. Pt required multiple episodes of redirection given focus on desire to discharge. Pt responded well to calm, firm speech. Pt able to complete basic functional math word problems and reasoning tasks with min A to slow pace and intermittently for reasoning assist. Primary barrier to success is pt's inability to self-monitor and slow pace to absorb all necessary information.      Function:   Eating Eating                 Cognition Comprehension Comprehension assist level: Understands basic 90% of the time/cues < 10% of the time  Expression   Expression assist level: Expresses basic 90% of the time/requires cueing < 10% of the time.  Social Interaction Social Interaction assist level: Interacts appropriately  25 - 49% of time - Needs frequent redirection.  Problem Solving Problem solving assist level: Solves basic 25 - 49% of the time - needs direction more than half the time to initiate, plan or complete simple activities  Memory Memory assist level: Recognizes or recalls 25 - 49% of the time/requires cueing  50 - 75% of the time   General    Pain Pain Assessment Faces Pain Scale: Hurts a little bit    Therapy/Group: Individual Therapy  Vinetta Bergamo 02/14/2016, 12:22 PM
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
## 2016-02-15 ENCOUNTER — Encounter (HOSPITAL_COMMUNITY): Payer: Self-pay

## 2016-02-15 ENCOUNTER — Inpatient Hospital Stay (HOSPITAL_COMMUNITY): Payer: Medicaid Other | Admitting: Physical Therapy

## 2016-02-15 ENCOUNTER — Inpatient Hospital Stay (HOSPITAL_COMMUNITY): Payer: Medicaid Other | Admitting: Speech Pathology

## 2016-02-15 ENCOUNTER — Inpatient Hospital Stay (HOSPITAL_COMMUNITY): Payer: Self-pay | Admitting: Occupational Therapy

## 2016-02-15 DIAGNOSIS — F4321 Adjustment disorder with depressed mood: Secondary | ICD-10-CM

## 2016-02-15 MED ORDER — ALUM & MAG HYDROXIDE-SIMETH 200-200-20 MG/5ML PO SUSP
30.0000 mL | ORAL | Status: DC | PRN
Start: 2016-02-15 — End: 2016-02-19
  Administered 2016-02-15 – 2016-02-19 (×11): 30 mL via ORAL
  Filled 2016-02-15 (×12): qty 30

## 2016-02-15 MED ORDER — QUETIAPINE FUMARATE 50 MG PO TABS
150.0000 mg | ORAL_TABLET | Freq: Three times a day (TID) | ORAL | Status: DC
  Administered 2016-02-15 – 2016-02-18 (×11): 150 mg
  Filled 2016-02-15 (×4): qty 1
  Filled 2016-02-15: qty 2
  Filled 2016-02-15 (×7): qty 1

## 2016-02-15 NOTE — Progress Notes (Signed)
Speech Language Pathology Daily Session Note  Patient Details  Name: April ChickCrystal D Padmore MRN: [id number] Date of Birth: 10/02/1970  Today's Date: 02/15/2016 SLP Individual Time: 0800-0900 SLP Individual Time Calculation (min): 60 min  Short Term Goals: Week 2: SLP Short Term Goal 1 (Week 2): Patient will demonstrate sustained attention to functional tasks for 30 minutes with Min A verbal cues for redirection.  SLP Short Term Goal 2 (Week 2): Pt will complete functional reasoning tasks with min A. SLP Short Term Goal 3 (Week 2): Pt will complete functional math tasks with min A. SLP Short Term Goal 4 (Week 2): Pt will tolerate regular textures without s/s aspiration to demonstrate readiness for diet upgrade.  Skilled Therapeutic Interventions: Skilled treatment session focused on dysphagia dn cognition goals. SLP facilitated session by providing skilled observation of dysphagia 3 trials. Pt consumed without s/s of overt aspiration. Recommend diet upgrade to dysphagia 3 as a result of multiple successful trials. Pt continues to require Mod A verbal cues for safety awareness and insight into cognitive deficits, therefore she continues to have impulsivity with eating and activities of daily living. As a result, pt continues to need supervision with all PO intake. Education written down for pt regarding new diet and education provided to nursing. Pt expressed that she wanted to go off unit with sisters and SLP provided Mod A verbal assistance to problem how she could enjoy sister's visit without going off unit. Pt with decreased ability to recall solution to problem. Pt completed functional reasoning and functional math tasks with Mod I. Pt sustained attention to functional tasks within mildly distracting environment for 60% with Min A verbal cues. Pt returned to room, all needs within reach. Continue current plan of care.   Function:  Eating Eating   Modified Consistency Diet: Yes Eating Assist Level:  Supervision or verbal cues           Cognition Comprehension Comprehension assist level: Understands basic 90% of the time/cues < 10% of the time  Expression   Expression assist level: Expresses basic 90% of the time/requires cueing < 10% of the time.  Social Interaction Social Interaction assist level: Interacts appropriately less than 25% of the time. May be withdrawn or combative.  Problem Solving Problem solving assist level: Solves basic 75 - 89% of the time/requires cueing 10 - 24% of the time  Memory Memory assist level: Recognizes or recalls 75 - 89% of the time/requires cueing 10 - 24% of the time    Pain    Therapy/Group: Individual Therapy  Yoali Conry 02/15/2016, 12:08 PM

## 2016-02-15 NOTE — Progress Notes (Signed)
Occupational Therapy Session Note  Patient Details  Name: [name] MRN: [id number] Date of Birth: 1971/05/23  Today's Date: 02/15/2016 OT Individual Time: 1020-1130 OT Individual Time Calculation (min): 70 min   Short Term Goals: Week 1:  OT Short Term Goal 1 (Week 1): Pt will perform toileting with min A in order to increase I with functional tasks.  OT Short Term Goal 1 - Progress (Week 1): Met OT Short Term Goal 2 (Week 1): Pt will perform LB dressing with min A in order to increase I with self care.  OT Short Term Goal 2 - Progress (Week 1): Met OT Short Term Goal 3 (Week 1): Pt will maintain NWB in L LE during functional transfer with min verbal cues for safety. OT Short Term Goal 3 - Progress (Week 1): Met OT Short Term Goal 4 (Week 1): Pt will perform grooming at sink with supervision and min cues for tasks.  OT Short Term Goal 4 - Progress (Week 1): Met   Week 2:  OT Short Term Goal 1 (Week 2): STGs = LTGs   Skilled Therapeutic Interventions/Progress Updates:  Patient found seated in w/c anxiously waiting OT. Pt with 10/10 complaints of pain in PEG tube site, monitored this during session and notified RN of this. WIth min encouragement, pt willing to complete ADL at shower level. Therapist propelled pt into BR via w/c and pt turned on water. Pt transferred w/c to shower seat with supervision and max cueing to adhere to NWB to LLE. Pt completed UB/LB bathing in sit to/from stand position, again requiring max cueing to adhere to NWB to LLE. Pt able to complete bathing at supervision level. Pt dried seated on shower seat and got dressed in sit to/from stand position from shower seat. Pt assisted with propulsion out of BR and worked with patient on combing and drying hair, this took increased time due to multiple knots/mats in hair. Pt transferred w/c to recliner for comfort. Patient's sister present towards end of session. Pt talking with sister about getting a sub from [place]. This  therapist talked with patient's SLP regarding this. According to SLP pt able to have a sub as long as family gets it for her, pt unable to go off unit at this time. Therapist asked patient how she was able to get sub and pt accurately stated that her family would have to get her a sub. At end of session, left pt seated in recliner with all needs within reach and sister +2 children present. Told family that they need to wash hands when leaving room.   Therapy Documentation Precautions:  Precautions Precautions: Fall, Cervical Cervical Brace: At all times, Hard collar Restrictions Weight Bearing Restrictions: Yes LLE Weight Bearing: Non weight bearing (noncompliant) Other Position/Activity Restrictions: LLE NWB 6 weeks  Vital Signs: Therapy Vitals Temp: 98.6 F (37 C) Temp Source: Oral Pulse Rate: 83 Resp: 20 BP: 125/66 mmHg Patient Position (if appropriate): Sitting Oxygen Therapy SpO2: 99 % O2 Device: Not Delivered  See Function Navigator for Current Functional Status.  Therapy/Group: Individual Therapy  Chrys Racer , MS, OTR/L, CLT  02/15/2016, 11:32 AM
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
## 2016-02-15 NOTE — Progress Notes (Signed)
Social Work Patient ID: [name], female   DOB: 08/22/1970, 45 y.o.   MRN: [id number]  Have reviewed team conference with pt and mother (via phone).  Both aware and agreeable with targeted d/c date of 7/4.  Pt very pleased and mother sounds cautiously pleased to hear that pt's behavior continues to improve.  Pt participating much better today and I am hopeful that, knowing her d/c date, she will continue to be more focused on therapies.  Mother talking with pt's son about building a ramp and is aware that goals are going to be for w/c level mobility.  Will continue to follow.  Clare Casto, LCSW
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
## 2016-02-15 NOTE — Progress Notes (Signed)
Physical Therapy Session Note  Patient Details  Name: [name] MRN: [id number] Date of Birth: 1971/02/09  Today's Date: 02/15/2016 PT Individual Time: 1539-1630 PT Individual Time Calculation (min): 51 min   Short Term Goals: Week 2:  PT Short Term Goal 1 (Week 2): = LTGs due to anticipated LOS  Skilled Therapeutic Interventions/Progress Updates:    Pt received resting in recliner and agreeable to therapy.  Reports soreness in neck and shoulders but does not rate.  Squat/pivot to w/c with supervision and mod cues for NWB on LLE.  Pt propelled w/c throughout unit with supervision.  Car transfer with supervision, pt maintaining WB status through entirety of transfer with min cues.  Pt performed 10 minutes on UEB, 5 minutes forward and 5 minutes reverse for UE strengthening and activity tolerance.  Pt requesting to see x-rays of fractured pelvis, states she does not believe it is broken because it doesn't hurt.  PT showed pt fractured area on x-ray and provided extensive education regarding instability of fracture and risks associated with weight bearing.  Pt verbalized understanding.  Pt requesting to toilet and propelled w/c back to room.  Stand/pivot w/c>toilet with supervision and maintaining WB restriction with no cues.  Squat/pivot w/c<>recliner with supervision and maintaining WB restriction with no cues.  PT instructed pt in BLE therex 2x10 reps for LAQ with 3 second hold and seated hip flexion for strengthening.  Pt left seated upright in recliner with call bell in reach and needs met.   Therapy Documentation Precautions:  Precautions Precautions: Fall, Cervical Cervical Brace: At all times, Hard collar Restrictions Weight Bearing Restrictions: Yes LLE Weight Bearing: Non weight bearing (non-compliant) Other Position/Activity Restrictions: LLE NWB 6 weeks   See Function Navigator for Current Functional Status.   Therapy/Group: Individual Therapy  Earnest Conroy  Penven[place]Crew 02/15/2016, 4:56 PM

## 2016-02-15 NOTE — Progress Notes (Signed)
1000: Pt called hospital switchboard claiming to be her mother. Stated (pretending to be her mother, Corrie DandyMary )  she wanted security called to 4W as we would not allow her to take her daughter home.  Pts mother verified she did not make call; operator verified call came from pt's room.

## 2016-02-16 ENCOUNTER — Inpatient Hospital Stay (HOSPITAL_COMMUNITY): Payer: Medicaid Other | Admitting: Speech Pathology

## 2016-02-16 ENCOUNTER — Inpatient Hospital Stay (HOSPITAL_COMMUNITY): Payer: Medicaid Other | Admitting: Occupational Therapy

## 2016-02-16 ENCOUNTER — Inpatient Hospital Stay (HOSPITAL_COMMUNITY): Payer: Self-pay | Admitting: Occupational Therapy

## 2016-02-16 ENCOUNTER — Inpatient Hospital Stay (HOSPITAL_COMMUNITY): Payer: Medicaid Other | Admitting: Physical Therapy

## 2016-02-16 MED ORDER — RISPERIDONE 1 MG PO TABS
0.5000 mg | ORAL_TABLET | Freq: Two times a day (BID) | ORAL | Status: DC
  Administered 2016-02-16 – 2016-02-17 (×2): 0.5 mg via ORAL
  Filled 2016-02-16 (×2): qty 1

## 2016-02-16 MED ORDER — ENSURE ENLIVE PO LIQD
237.0000 mL | Freq: Two times a day (BID) | ORAL | Status: DC
  Administered 2016-02-17 – 2016-02-19 (×5): 237 mL via ORAL
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
## 2016-02-16 NOTE — Progress Notes (Signed)
Occupational Therapy Session Note  Patient Details  Name: April ChickCrystal D Canner MRN: [id number] Date of Birth: 04/23/1971  Today's Date: 02/16/2016 OT Individual Time: 0930-1000 OT Individual Time Calculation (min): 30 min  and Today's Date: 02/16/2016 OT Missed Time: 45 Minutes Missed Time Reason: Patient ill (comment);Patient unwilling/refused to participate without medical reason (stomach and neck pain)   Short Term Goals: Week 2:  OT Short Term Goal 1 (Week 2): STGs = LTGs   Skilled Therapeutic Interventions/Progress Updates:    Upon entering the room, pt seated in recliner chair with c/o neck pain and stomach pain. Pt stating, "I threw up." Pt continues to perseverate on being sick this morning although she does not report being nauseated at this time. Pt declined bathing and dressing this session. Pt performed stand pivot from recliner chair to wheelchair with close supervision and max cues to maintain NWB in L LE during transfer. Pt able to void and performed lateral leans on toilet for clothing management and hygiene with supervision. Pt performed grooming tasks while at sink while seated in wheelchair with min verbal cues. Pt oriented x 4 this session. Pt transferring back to recliner chair with continued max cues for NWB and supervison for task. Pt refused to engage in any other activities this session and states, "I can't. I'm sick." Call bell and all needed items within reach upon exiting the room.   Therapy Documentation Precautions:  Precautions Precautions: Fall, Cervical Cervical Brace: At all times, Hard collar Restrictions Weight Bearing Restrictions: Yes LLE Weight Bearing: Non weight bearing (does not follow precautions) Other Position/Activity Restrictions: LLE NWB 6 weeks General: General OT Amount of Missed Time: 45 Minutes Vital Signs:   Pain: Pain Assessment Pain Assessment: 0-10 Pain Score: 10-Worst pain ever Pain Type: Acute pain Pain Location: Neck Pain  Orientation: Posterior Pain Descriptors / Indicators: Aching Pain Onset: On-going Patients Stated Pain Goal: 3 Pain Intervention(s): Repositioned  See Function Navigator for Current Functional Status.   Therapy/Group: Individual Therapy  Lowella Gripittman, Gael Londo L 02/16/2016, 10:56 AM

## 2016-02-16 NOTE — Progress Notes (Signed)
Speech Language Pathology Daily Session Note  Patient Details  Name: [name] MRN: [id number] Date of Birth: 11/21/1970  Today's Date: 02/16/2016 SLP Individual Time: 1045-1200 SLP Individual Time Calculation (min): 75 min  Short Term Goals: Week 2: SLP Short Term Goal 1 (Week 2): Patient will demonstrate sustained attention to functional tasks for 30 minutes with Min A verbal cues for redirection.  SLP Short Term Goal 2 (Week 2): Pt will complete functional reasoning tasks with min A. SLP Short Term Goal 3 (Week 2): Pt will complete functional math tasks with min A. SLP Short Term Goal 4 (Week 2): Pt will tolerate regular textures without s/s aspiration to demonstrate readiness for diet upgrade.  Skilled Therapeutic Interventions: Skilled treatment session focused on dysphagia and cogntion goals. SLP facilitated session by providing skilled observation of trial regular lunch tray (Hamburger and tossed salad). Pt consumed without any s/s of aspiration/dysphagia. However pt requires Mod to Min A verbal cues for impulsive rate. Pt managed consumption well, therefore recommend continued upgrade for regular diet with full nursing supervision during intake d/t impulsivity. Pt able to recall pelvic fracture with Mod I and able to implement precautions during transfer to toilet without cues. Pt provided with picture of her pelvic x-ray and she was able to recall previous day's conversation with PT regarding precautions to protect fracture. Pt able to complete functional reasoning tasks with Min A verbal cues. Throughout session, pt with multiple complains of "feeling sick," reflux and pain from recent Lovenox shot. Nursing treated during session however pt with perseverative comments. Pt required Mod A verbal cues for redirection. Pt returned ot recliner in her room and left with all nees within reach. Nursing aware of pt's physical complaints and are treating. Continue current plan of care.    Function:  Eating Eating   Modified Consistency Diet: No Eating Assist Level: Supervision or verbal cues           Cognition Comprehension Comprehension assist level: Understands basic 90% of the time/cues < 10% of the time  Expression   Expression assist level: Expresses basic 90% of the time/requires cueing < 10% of the time.  Social Interaction Social Interaction assist level: Interacts appropriately 25 - 49% of time - Needs frequent redirection.  Problem Solving Problem solving assist level: Solves basic 75 - 89% of the time/requires cueing 10 - 24% of the time  Memory Memory assist level: Recognizes or recalls 75 - 89% of the time/requires cueing 10 - 24% of the time    Pain    Therapy/Group: Individual Therapy  Gaetana Kawahara 02/16/2016, 11:58 AM

## 2016-02-16 NOTE — Progress Notes (Signed)
Physical Therapy Session Note  Patient Details  Name: [name] MRN: [id number] Date of Birth: 12/02/1970  Today's Date: 02/16/2016 PT Individual Time: 0800-0830 and 1420-1550 PT Individual Time Calculation (min): 30 min and 90 min  Short Term Goals: Week 2:  PT Short Term Goal 1 (Week 2): = LTGs due to anticipated LOS  Skilled Therapeutic Interventions/Progress Updates:   Treatment 1: Session focused on safety and emergent awareness as patient initially attempting to transfer from unlocked recliner chair but instead of waiting for therapist to lock it, she stood on BLE and transferred via stand pivot on BLE despite max multimodal cues to wait/maintain NWB LLE, toileting via stand pivot using grab bar with supervision and able to maintain LLE NWB, hand hygiene from wheelchair level, wheelchair propulsion using BUE around day room with supervision and encouragement as patient reporting not feeling good this morning, and attention and comprehension task using newspaper article with min multimodal cues and more than reasonable time. Patient left reclined in recliner with all needs in reach.   Treatment 2: Session focused on squat pivot transfers and stand pivot transfers from variety of surfaces including to/from toilet using grab bar with supervision overall and max verbal cues for wheelchair setup and min cues for NWB LLE, engaging in tabletop task with focus on selective attention in moderately distracting environment to challenge working memory approx 20 min, negotiating up/down 2" step in parallel bars x 2 with steady assist with increased B shoulder pain due to patient's request to attempt stairs again, arm bike at patient's request x 4 min (2 min forward, 2 min backward) terminated due to increased BUE pain, seated BUE therex using 3 # dowel rod at patient's request again terminated due to increased BUE pain, wheelchair propulsion using BUE 2 x 100 ft with supervision and increased time with  min verbal cues for technique, and organizing patient's room at patient's request to decrease patient's anxiety. Patient restless and anxious throughout session, rocking back and forth in wheelchair, unwilling to stay in one spot to rest before demanding to start another task, and requiring frequent redirection for safety. Patient consumed snack of Dys 3 diet and thin liquids with supervision. Patient left sitting in recliner with all needs in reach.    Therapy Documentation Precautions:  Precautions Precautions: Fall, Cervical Cervical Brace: At all times, Hard collar Restrictions Weight Bearing Restrictions: Yes LLE Weight Bearing: Non weight bearing (does not follow precautions) Other Position/Activity Restrictions: LLE NWB 6 weeks Pain: Pain Assessment Pain Assessment: 0-10 Pain Score: 10-Worst pain ever Pain Type: Acute pain Pain Location: Neck Pain Orientation: Posterior Pain Descriptors / Indicators: Aching Pain Onset: On-going Patients Stated Pain Goal: 3 Pain Intervention(s): Repositioned  See Function Navigator for Current Functional Status.   Therapy/Group: Individual Therapy  Kerney ElbeVarner, Vernie Piet A 02/16/2016, 9:37 AM

## 2016-02-17 ENCOUNTER — Inpatient Hospital Stay (HOSPITAL_COMMUNITY): Payer: Self-pay | Admitting: Occupational Therapy

## 2016-02-17 ENCOUNTER — Inpatient Hospital Stay (HOSPITAL_COMMUNITY): Payer: Medicaid Other | Admitting: Physical Therapy

## 2016-02-17 DIAGNOSIS — S3219XA Other fracture of sacrum, initial encounter for closed fracture: Secondary | ICD-10-CM

## 2016-02-17 LAB — GLUCOSE, CAPILLARY: Glucose-Capillary: 110 mg/dL — ABNORMAL HIGH (ref 65–99)

## 2016-02-17 MED ORDER — TRAZODONE HCL 50 MG PO TABS
50.0000 mg | ORAL_TABLET | Freq: Every evening | ORAL | Status: DC | PRN
  Administered 2016-02-17 – 2016-02-18 (×2): 50 mg via ORAL
  Filled 2016-02-17 (×2): qty 1

## 2016-02-17 MED ORDER — CHLORDIAZEPOXIDE HCL 5 MG PO CAPS
5.0000 mg | ORAL_CAPSULE | Freq: Three times a day (TID) | ORAL | Status: DC
  Administered 2016-02-17 – 2016-02-19 (×8): 5 mg via ORAL
  Filled 2016-02-17 (×8): qty 1

## 2016-02-17 MED ORDER — FAMOTIDINE 20 MG PO TABS
20.0000 mg | ORAL_TABLET | Freq: Two times a day (BID) | ORAL | Status: DC
  Administered 2016-02-17 – 2016-02-19 (×4): 20 mg via ORAL
  Filled 2016-02-17 (×4): qty 1

## 2016-02-17 MED ORDER — RISPERIDONE 0.25 MG PO TABS
0.2500 mg | ORAL_TABLET | Freq: Two times a day (BID) | ORAL | Status: DC
  Administered 2016-02-17 – 2016-02-18 (×3): 0.25 mg via ORAL
  Filled 2016-02-17 (×4): qty 1

## 2016-02-17 MED ORDER — FREE WATER
100.0000 mL | Freq: Every day | Status: DC
  Administered 2016-02-18 – 2016-02-19 (×2): 100 mL

## 2016-02-17 NOTE — Progress Notes (Signed)
Physical Therapy Session Note  Patient Details  Name: April ChickCrystal D Pomeroy MRN: [id number] Date of Birth: 12/17/1970  Today's Date: 02/17/2016 PT Individual Time: 0800-0900 PT Individual Time Calculation (min): 60 min   Short Term Goals: Week 2:  PT Short Term Goal 1 (Week 2): = LTGs due to anticipated LOS  Skilled Therapeutic Interventions/Progress Updates:   Upon arrival, patient leaving room in wheelchair with one of her ex-husbands (Chava). Patient educated on need to wipe down equipment with bleach wipes due to precautions before exiting room. Patient in wrong wheelchair (still in room due to enteric precautions), performed squat pivot transfer to recliner and to correct size wheelchair with cushion with supervision and max verbal cues for safe wheelchair setup. Patient propelled wheelchair using BUE in controlled environment room <> ortho gym with supervision and verbal cues for avoiding obstacles/techniques including weaving in/out of cones with max verbal cues to propel herself as patient repeatedly asking ex-husband to push her. Performed simulated car transfer to sedan height with patient impulsively standing and stepping into car with LLE despite max-total cues for safe technique and second trial with patient demonstrating correct and safe technique via squat pivot. Patient requesting to shower, therefore returned to room and transported into bathroom in wheelchair and patient turned on water. Pt transferred wheelchair <> shower seat with supervision while maintaining NWB LLE. Pt completed UB/LB bathing on shower seat at supervision level. Pt dried seated on shower seat and performed UB/LB dressing with sit <> stand from wheelchair with setup/supervision. Patient brushed teeth from wheelchair level and therapist combed hair due to c/o B shoulder pain. Patient transferred to recliner with supervision and cues for safe wheelchair setup and left in recliner with needs in reach and ex-husband present.  Patient with continued restless and anxious behavior throughout session but appeared more calm after showering task.   Therapy Documentation Precautions:  Precautions Precautions: Fall, Cervical Cervical Brace: At all times, Hard collar Restrictions Weight Bearing Restrictions: Yes LLE Weight Bearing: Non weight bearing (noncompliant) Other Position/Activity Restrictions: LLE NWB 6 weeks Pain: Pain Assessment Pain Assessment: 0-10 Pain Score: 10-Worst pain ever Pain Type: Acute pain Pain Location: Shoulder Pain Orientation: Right;Left Pain Descriptors / Indicators: Aching Pain Onset: On-going Pain Intervention(s): Shower   See Function Navigator for Current Functional Status.   Therapy/Group: Individual Therapy  Kerney ElbeVarner, Ben Habermann A 02/17/2016, 9:01 AM

## 2016-02-17 NOTE — Progress Notes (Signed)
02/17/16 [id number] nursing Patient seen coming out of the bathrrom. Reminded patient and husband   to call when patient goes to bathroom.
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
## 2016-02-17 NOTE — Progress Notes (Signed)
02/17/16 1850 nursing Patient does not call when she goes to bathroom. Complains that Maalox does not help with her heartburn. Requesting sleep med. MD notified. New orders noted.

## 2016-02-18 ENCOUNTER — Inpatient Hospital Stay (HOSPITAL_COMMUNITY): Payer: Self-pay | Admitting: Physical Therapy

## 2016-02-18 DIAGNOSIS — F1910 Other psychoactive substance abuse, uncomplicated: Secondary | ICD-10-CM

## 2016-02-18 NOTE — Plan of Care (Signed)
Problem: RH SAFETY Goal: RH STG ADHERE TO SAFETY PRECAUTIONS W/ASSISTANCE/DEVICE STG Adhere to Safety Precautions With Assistance/Device.  Outcome: Not Progressing Patient non-compliant with safety precautions.  Refuses to sleep in enclosure bed. Ambulates in room without notification of staff.   Problem: RH PAIN MANAGEMENT Goal: RH STG PAIN MANAGED AT OR BELOW PT'S PAIN GOAL < 4 on 0-10 pain scale  Outcome: Not Progressing Continues to complain of neck and shoulder pain 7/8 on 1/10 scale.

## 2016-02-18 NOTE — Plan of Care (Signed)
Problem: RH SAFETY Goal: RH STG ADHERE TO SAFETY PRECAUTIONS W/ASSISTANCE/DEVICE STG Adhere to Safety Precautions With Assistance/Device.  Outcome: Not Progressing Noncompliant with calling for assistance and weight bearing status.   Problem: RH PAIN MANAGEMENT Goal: RH STG PAIN MANAGED AT OR BELOW PT'S PAIN GOAL < 4 on 0-10 pain scale  Outcome: Not Progressing Continues to rate neck pain as >7 on 1-10 scale

## 2016-02-18 NOTE — Progress Notes (Signed)
02/18/16 1801 nursing Patient still non compliant re: calling staff to go to  bathroom. Reminded patient.
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
## 2016-02-18 NOTE — Progress Notes (Signed)
Patient refused to sleep in enclosure bed this shift.  Slept in recliner without incident.  Remains noncompliant with NWB to LLE and calling for assistance with ambulating to bathroom.  Call bell within reach.    Kelli HopeBarber, Janki Dike M

## 2016-02-18 NOTE — Progress Notes (Signed)
Patient currently sleeping in recliner in her room.  Refusing to sleep in enclosure bed.  Call bell within reach.   [name], [name] M

## 2016-02-18 NOTE — Progress Notes (Signed)
Patient's room smelled of smoke and perfume this morning.  Room hazy.  Patient denied smoking in room when questioned.  Reviewed smoking policy with patient and patient stated, " Yeah I already know all of this shit".    Kelli HopeBarber, Donnis Phaneuf M

## 2016-02-18 NOTE — Progress Notes (Signed)
Physical Therapy Note  Patient Details  Name: [name] MRN: [id number] Date of Birth: 06/29/1971 Today's Date: 02/18/2016    Time: 1000-1043 43 minutes  1:1 Pt c/o neck and shoulder pain when propelling w/c, RN aware, rests as needed. Pt up in room walking to bathroom upon PT arrival.  PT reiterated importance of NWB for healing, pt states "it's not my legs that hurt, it's my back". PT attempted to educate pt that maintaining NWB may help her back pain, pt unable to understand and accept.  Pt performed w/c propulsion in home and controlled environments with supervision, limited to 50' at max due to c/o neck and shoulder pain. PT attempted to have pt use R LE for propulsion to relieve neck and shoulder pain, pt states "That is too hard, you can just push me".  Pt performed nu step with R LE and B UEs x 10 minutes, pt states she enjoys this activity.  Pt performed standing in parallel bars with max cuing to maintain NWB. Pt unwilling to follow wt bearing precautions. Pt asked to do laundry and was upset that she is not able to due to enteric precautions, PT and RN educated pt on need for enteric precautions, pt with better understanding. Pt left in recliner in room with needs at hand.    Victoria Euceda 02/18/2016, 10:48 AM

## 2016-02-18 NOTE — Progress Notes (Signed)
02/18/16 1847 nursing Patient came up to nurses station on her wheelchair and she said to RN that she is proud of herself that she took a shower by herself. RN said she should have called for help and she said we'll you are all busy. Patient did not call for any help at all.

## 2016-02-18 NOTE — Plan of Care (Signed)
Problem: RH SAFETY Goal: RH STG ADHERE TO SAFETY PRECAUTIONS W/ASSISTANCE/DEVICE STG Adhere to Safety Precautions With Assistance/Device.  Outcome: Not Progressing Non compliant; does not call for staff to go to bathroom

## 2016-02-19 ENCOUNTER — Inpatient Hospital Stay (HOSPITAL_COMMUNITY): Payer: Medicaid Other | Admitting: Occupational Therapy

## 2016-02-19 ENCOUNTER — Inpatient Hospital Stay (HOSPITAL_COMMUNITY): Payer: Medicaid Other | Admitting: Speech Pathology

## 2016-02-19 ENCOUNTER — Inpatient Hospital Stay (HOSPITAL_COMMUNITY): Payer: Self-pay | Admitting: Physical Therapy

## 2016-02-19 ENCOUNTER — Inpatient Hospital Stay (HOSPITAL_COMMUNITY): Payer: Self-pay

## 2016-02-19 DIAGNOSIS — S3219XS Other fracture of sacrum, sequela: Secondary | ICD-10-CM

## 2016-02-19 MED ORDER — TRAMADOL HCL 50 MG PO TABS
50.0000 mg | ORAL_TABLET | Freq: Four times a day (QID) | ORAL | Status: DC | PRN
  Administered 2016-02-19: 50 mg via ORAL
  Filled 2016-02-19: qty 1

## 2016-02-19 MED ORDER — QUETIAPINE FUMARATE 50 MG PO TABS: 150.0000 mg | ORAL_TABLET | Freq: Three times a day (TID) | ORAL | Status: DC

## 2016-02-19 MED ORDER — PANTOPRAZOLE SODIUM 40 MG PO TBEC
40.0000 mg | DELAYED_RELEASE_TABLET | Freq: Every day | ORAL | Status: DC
  Administered 2016-02-19: 40 mg via ORAL
  Filled 2016-02-19: qty 1

## 2016-02-19 MED ORDER — DIVALPROEX SODIUM 125 MG PO CSDR: 500.0000 mg | DELAYED_RELEASE_CAPSULE | Freq: Three times a day (TID) | ORAL | Status: DC

## 2016-02-19 MED ORDER — SACCHAROMYCES BOULARDII 250 MG PO CAPS: 250.0000 mg | ORAL_CAPSULE | Freq: Two times a day (BID) | ORAL | Status: AC

## 2016-02-19 MED ORDER — TRAMADOL HCL 50 MG PO TABS: 50.0000 mg | ORAL_TABLET | Freq: Four times a day (QID) | ORAL | Status: DC | PRN

## 2016-02-19 MED ORDER — TRAZODONE HCL 50 MG PO TABS: 50.0000 mg | ORAL_TABLET | Freq: Every evening | ORAL | Status: DC | PRN

## 2016-02-19 MED ORDER — QUETIAPINE FUMARATE 50 MG PO TABS
150.0000 mg | ORAL_TABLET | Freq: Three times a day (TID) | ORAL | Status: DC
  Administered 2016-02-19 (×2): 150 mg via ORAL
  Filled 2016-02-19: qty 1

## 2016-02-19 MED ORDER — NICOTINE 14 MG/24HR TD PT24: MEDICATED_PATCH | TRANSDERMAL | Status: AC

## 2016-02-19 MED ORDER — PANTOPRAZOLE SODIUM 40 MG PO TBEC: 40.0000 mg | DELAYED_RELEASE_TABLET | Freq: Every day | ORAL | Status: AC

## 2016-02-19 MED ORDER — CHLORDIAZEPOXIDE HCL 5 MG PO CAPS: 5.0000 mg | ORAL_CAPSULE | Freq: Three times a day (TID) | ORAL | Status: DC

## 2016-02-19 NOTE — Progress Notes (Signed)
Pt. Got d/c instructions and follow up appointments.Pt. Ready to go home with her mother.

## 2016-02-19 NOTE — Discharge Summary (Signed)
Discharge summary job # [id number]

## 2016-02-19 NOTE — Progress Notes (Signed)
Social Work  Discharge Note  The overall goal for the admission was met for:   Discharge location: Yes - home with parents.  Mother to provide primary support.  Length of Stay: Yes - 14 days  Discharge activity level: Yes - supervision  Home/community participation: Yes  Services provided included: MD, RD, PT, OT, SLP, RN, TR, Pharmacy, Neuropsych and SW  Financial Services: Pt has  Medicaid and SSD applications pending  Follow-up services arranged: Home Health: PT, OT, ST via [place], DME: 18x16 lightweight w/c, cushion, tub seat via [place], Other: Referred to [facility] medication assistance program and to [facility] and Patient/Family has no preference for HH/DME agencies  Comments (or additional information):  Patient/Family verbalized understanding of follow-up arrangements: Yes  Individual responsible for coordination of the follow-up plan: pt  Confirmed correct DME delivered: [name] 02/19/2016    [name]

## 2016-02-19 NOTE — Discharge Instructions (Signed)
Inpatient Rehab Discharge Instructions  [name] Discharge date and time: No discharge date for patient encounter.   Activities/Precautions/ Functional Status: Activity: Nonweightbearing left lower extremity Diet: regular diet Wound Care: keep wound clean and dry Functional status:  ___ No restrictions     ___ Walk up steps independently ___ 24/7 supervision/assistance   ___ Walk up steps with assistance ___ Intermittent supervision/assistance  ___ Bathe/dress independently ___ Walk with walker     _x__ Bathe/dress with assistance ___ Walk Independently    ___ Shower independently ___ Walk with assistance    ___ Shower with assistance ___ No alcohol     ___ Return to work/school ________     COMMUNITY REFERRALS UPON DISCHARGE:    Home Health:   PT    OT     ST                        Agency:  [facility] Phone: [phone]    Medical Equipment/Items Ordered: wheelchair, cushion, tub seat                                                     Agency/Supplier:  [facility] [phone]    GENERAL COMMUNITY RESOURCES FOR PATIENT/FAMILY:   Mental Health:  [facility] - Call [phone] to arrange follow up evaluation and services.  This # can also be called in case of a mental health crisis.    Special Instructions: No driving alcohol smoking or illicit drug products   My questions have been answered and I understand these instructions. I will adhere to these goals and the provided educational materials after my discharge from the hospital.  Patient/Caregiver Signature _______________________________ Date __________  Clinician Signature _______________________________________ Date __________  Please bring this form and your medication list with you to all your follow-up doctor's appointments.

## 2016-02-19 NOTE — Progress Notes (Signed)
Speech Language Pathology Discharge Summary  Patient Details  Name: [name] MRN: [id number] Date of Birth: Nov 08, 1970  Today's Date: 02/19/2016 SLP Individual Time: 0900-1000 SLP Individual Time Calculation (min): 60 min   Skilled Therapeutic Interventions:  Skilled treatment session focused on cognition goals. Pt's door closed when ST approached room, it is always open. Upon entering room, air was hazy with heavy smell of cigarette smoke. Pt was standing at the sink spraying perfume. Education provided on NWB on right leg. Pt stated that she knew it but was up at the sink without wheelchair nearby. SLP further facilitated the session by providing Min A to Mod A verbal cues for sustaining attention to functional taks for 45 minutes and for completing functional reasoning tasks. Pt with impulsive comments regarding nursing staff and pain in various locations on her body. MOCA version 7.1 administered and pt received score of 25/30 with 26 or higher being average score. Education provided to pt and her mother on Min A fluctuating to Mod A support when she discharges home.     Patient has met 4 of 7 long term goals.  Patient to discharge at overall Min;Mod level.  Reasons goals not met: Pt's varies between Min A and Mod A verbal cues d/t impulsivity and perseveration on pain medication.    Clinical Impression/Discharge Summary: Pt has made progress in skilled ST sessions as evidenced by meeting 4 of 7 LTGs. Pt continues to struggle with impulsive behaviors and perseveration of topics such as receiving pain medication.  Impulsivity impacts pt's ability to process safety information and demonstrate emergent awareness as well as demonstrate selective attention. On last day of therapy pt socred a 25/30 on MOCA 7.1 with 26 or higher being within the average range. Pt would benefit from at least Min A  fluctuating to Mod A when she discharges home.   Care Partner:  Caregiver Able to Provide  Assistance: Yes  Type of Caregiver Assistance: Cognitive;Physical  Recommendation:  24 hour supervision/assistance  Rationale for SLP Follow Up: Maximize cognitive function and independence;Reduce caregiver burden   Equipment: N/A   Reasons for discharge: Treatment goals met   Patient/Family Agrees with Progress Made and Goals Achieved: Yes   Function:   Cognition Comprehension Comprehension assist level: Understands complex 90% of the time/cues 10% of the time  Expression   Expression assist level: Expresses basic 90% of the time/requires cueing < 10% of the time.  Social Interaction Social Interaction assist level: Interacts appropriately 25 - 49% of time - Needs frequent redirection.  Problem Solving Problem solving assist level: Solves basic 75 - 89% of the time/requires cueing 10 - 24% of the time  Memory Memory assist level: Recognizes or recalls 75 - 89% of the time/requires cueing 10 - 24% of the time   [name] 02/19/2016, 1:51 PM

## 2016-02-19 NOTE — Progress Notes (Signed)
Physical Therapy Discharge Summary  Patient Details  Name: [name] MRN: [id number] Date of Birth: [birth year]  Today's Date: 02/19/2016 PT Individual Time: 1340-1420 PT Individual Time Calculation (min): 40 min  Pt missed 20 minutes due to refusal; agitation  Patient has met 9 of 9 long term goals due to improved activity tolerance, improved balance, decreased pain, ability to compensate for deficits, functional use of  left lower extremity,(NWBing) improved attention and improved awareness.  Patient to discharge at a wheelchair level Supervision.   Patient's care partner is independent to provide the necessary cognitive assistance at discharge. Pt was unable to maintain NWBing LLE during this stay, so ambulation LTGs were not pursued.  Reasons goals not met: n/a  Recommendation:  Patient will benefit from ongoing skilled PT services in home health setting to continue to advance safe functional mobility, address ongoing impairments in cognition, ambulation, steps, strength,  and minimize fall risk.  Equipment: rental w/c with regular footrests and seat cushion; RW  Reasons for discharge: treatment goals met and discharge from hospital  Patient/family agrees with progress made and goals achieved: Yes  PT Discharge  CSW requested PT see this pt ASAP due to pt agitated and very ready to go home.  Pt markedly anxious and easily agitated, but willing to participate for family ed.  Pt's mother here for family ed.  Family ed with mother for w/c parts mgt; simulated car transfer to sedan height seat, folding w/c; pushing w/c up/down ramp; supervision for stand pivot transfer NWBing LLE; cueing and guarding pt appropriately. Pt's mother needed multiple cues for w/c parts mgt, where to stand to guard pt for transfers and how to push w/c up/down ramp.  She improved with practice and is safe to supervise pt for d/c. Pt and mother stated that ramp "should be finished" by the time they get home.   PT instructed mother in how 2 strong, able- bodied people bump w/c up 5 steps to enter home in case ramp is not completed. Pt left sitting in w/c with mother and granddaughter in room.  PT notified RN that family ed was completed; RN stated she would be with pt in 5 minutes; PT conveyed to pt.  Precautions/Restrictions Precautions Precautions: Fall;Cervical Precaution Comments: no longer has flexiseal Restrictions Weight Bearing Restrictions: Yes LLE Weight Bearing: Non weight bearing Other Position/Activity Restrictions: LLE NWB 6 weeks Pain Pain Assessment Pain Score: 3  Faces Pain Scale: No hurt Vision/Perception - no change; at baseline    Cognition Overall Cognitive Status: Impaired/Different from baseline Arousal/Alertness: Awake/alert Orientation Level: Oriented X4 Attention: Sustained Focused Attention: Appears intact Sustained Attention: Impaired Sustained Attention Impairment: Verbal complex;Functional complex Memory: Impaired Memory Impairment: Storage deficit;Retrieval deficit;Decreased recall of new information;Decreased short term memory Decreased Short Term Memory: Verbal complex;Functional complex Awareness: Impaired Awareness Impairment: Intellectual impairment Problem Solving: Impaired Problem Solving Impairment: Verbal complex;Functional complex Reasoning: Impaired Behaviors: Impulsive;Restless;Verbal agitation;Perseveration Safety/Judgment: Impaired [employer] Scales of Cognitive Functioning: Confused/appropriate Sensation Sensation Light Touch: Appears Intact Hot/Cold: Appears Intact Proprioception: Appears Intact Coordination Gross Motor Movements are Fluid and Coordinated: No Fine Motor Movements are Fluid and Coordinated: No Heel Shin Test: NT Motor  Motor Motor: Within Functional Limits Motor - Skilled Clinical Observations: generalized weakness and deconditioning Motor - Discharge Observations: generalized weakness and deconditioning   Mobility Bed Mobility Bed Mobility: Not assessed (pt refused to participate in bed mobility) Rolling Left: Not tested (comment) Supine to Sit: Not tested (comment) Transfers Transfers: Yes Stand Pivot Transfers: 5: Supervision;With  armrests Stand Pivot Transfer Details: Verbal cues for precautions/safety Stand Pivot Transfer Details (indicate cue type and reason): cues occasionally to NWB LLE Locomotion  Ambulation Ambulation: No (pt unable to maintain NWBing LLE; unsafe) Gait Gait: No Stairs / Additional Locomotion Stairs: No Wheelchair Mobility Wheelchair Mobility: Yes Wheelchair Assistance: 5: Careers information officer: Both upper extremities Wheelchair Parts Management: Supervision/cueing Distance: 150  Trunk/Postural Assessment  Cervical Assessment Cervical Assessment: Exceptions to [place] (cervical precautions, no hard collar at d/c) Thoracic Assessment Thoracic Assessment: Exceptions to [facility] (rounded shoulders/forward flexed; self limits movement) Lumbar Assessment Lumbar Assessment: Within Functional Limits (posterior pelvic tilt) Postural Control Postural Control: Deficits on evaluation Protective Responses: delayed/impaired  Balance Balance Balance Assessed: Yes Static Sitting Balance Static Sitting - Balance Support: Feet unsupported Static Sitting - Level of Assistance: 6: Modified independent (Device/Increase time) Dynamic Sitting Balance Dynamic Sitting - Balance Support: During functional activity Dynamic Sitting - Level of Assistance: 5: Stand by assistance Dynamic Sitting - Balance Activities: Reaching for objects (w/c footrests mgt) Sitting balance - Comments: supervision for w/c brakes; impulsive Static Standing Balance Static Standing - Level of Assistance: 5: Stand by assistance (briefly during stand pivot transfer) Dynamic Standing Balance Dynamic Standing - Level of Assistance: 5: Stand by assistance (stand pivot transfer, NWBing  LLE) Extremity Assessment      RLE Assessment RLE Assessment: Within Functional Limits LLE Assessment LLE Assessment: Not tested (pelvic fx; not tested)   See Function Navigator for Current Functional Status.  Elihu Milstein 02/19/2016, 2:36 PM

## 2016-02-19 NOTE — Plan of Care (Signed)
Problem: RH Attention Goal: LTG Patient will demonstrate focused/sustained (OT) LTG: Patient will demonstrate focused/sustained/selective/alternating/divided attention during functional activities in specific environment with assist for # of minutes (OT)  Outcome: Not Met (add Reason) Pt with sustained attention at this time     Problem: RH Awareness Goal: LTG: Patient will demonstrate intellectual/emergent (OT) LTG: Patient will demonstrate intellectual/emergent/anticipatory awareness with assist during a functional activity (OT)  Outcome: Not Met (add Reason) Pt demonstrating intellectual awareness at this time

## 2016-02-19 NOTE — Progress Notes (Signed)
Occupational Therapy Discharge Summary  Patient Details  Name: [name] MRN: [id number] Date of Birth: 08-Aug-1971  Today's Date: 02/19/2016 OT Individual Time: 1000-1100 OT Individual Time Calculation (min): 60 min    Patient has met 10 of 12 long term goals due to improved activity tolerance, improved balance, ability to compensate for deficits, improved attention, improved awareness and improved coordination.  Patient to discharge at overall Supervision level.  Patient's care partner is independent to provide the necessary physical and cognitive assistance at discharge.    Reasons goals not met: Pt did not meet goals in regards to awareness and attention.   Recommendation:  Patient will benefit from ongoing skilled OT services in home health setting to continue to advance functional skills in the area of BADL and iADL.  Equipment: shower chair  Reasons for discharge: discharge from hospital  Patient/family agrees with progress made and goals achieved: Yes   OT Intervention: Upon entering the room, pt seated in recliner chair awaiting therapist with no c/o pain this session. Pt is oriented x 4 this session. Pt is perseverating on the fact that she may be going home today. Pt requesting multiple times this session to speak to social worker and physician regarding discharge with pt needing to be redirected with mod cues this session. Pt declined bathing and dressing this session. Pt propelled wheelchair with B UE's and R LE 150' with supervision over inclined surface. Pt's mother arriving with 15 minutes left of session. OT educated caregiver on OT recommendation in home for safety with supervision. OT demonstrated toilet transfer with min cues for pt to be NWB on L LE. Pt compliant with weight bearing during toilet transfer. Caregiver then providing close supervision for toilet transfer. Pt able to void and performing clothing management with supervision before returning to room. Pt  remained in wheelchair at end of session with caregivers stating they had no further questions. OT exiting the room with call bell and all needed items within reach.  OT Discharge Precautions/Restrictions  Precautions Precautions: Fall;Cervical Precaution Comments: no longer has flexiseal Restrictions Weight Bearing Restrictions: Yes LLE Weight Bearing: Non weight bearing Other Position/Activity Restrictions: LLE NWB 6 weeks General PT Missed Treatment Reason: Increased agitation;Patient unwilling to participate Vital Signs  Pain Pain Assessment Pain Assessment: No/denies pain Faces Pain Scale: No hurt ADL   Vision/Perception  Vision- History Baseline Vision/History: No visual deficits Patient Visual Report: No change from baseline Vision- Assessment Vision Assessment?: No apparent visual deficits  Cognition Overall Cognitive Status: Impaired/Different from baseline Arousal/Alertness: Awake/alert Orientation Level: Oriented X4 Attention: Sustained Focused Attention: Appears intact Sustained Attention: Impaired Sustained Attention Impairment: Verbal complex;Functional complex Memory: Impaired Memory Impairment: Storage deficit;Retrieval deficit;Decreased recall of new information;Decreased short term memory Decreased Short Term Memory: Verbal complex;Functional complex Awareness: Impaired Awareness Impairment: Intellectual impairment Problem Solving: Impaired Problem Solving Impairment: Verbal complex;Functional complex Reasoning: Impaired Behaviors: Impulsive;Restless;Verbal agitation;Perseveration Safety/Judgment: Impaired [employer] Scales of Cognitive Functioning: Confused/appropriate Sensation Sensation Light Touch: Appears Intact Stereognosis: Not tested Hot/Cold: Appears Intact Proprioception: Appears Intact Coordination Gross Motor Movements are Fluid and Coordinated: No Fine Motor Movements are Fluid and Coordinated: No Heel Shin Test: NT Motor   Motor Motor: Within Functional Limits Motor - Skilled Clinical Observations: generalized weakness and deconditioning Motor - Discharge Observations: generalized weakness and deconditioning Mobility  Bed Mobility Bed Mobility: Not assessed (pt refused to participate in bed mobility) Rolling Left: Not tested (comment) Supine to Sit: Not tested (comment) Transfers Transfers: Sit to Stand Sit to Stand:  5: Supervision  Trunk/Postural Assessment  Cervical Assessment Cervical Assessment: Exceptions to [facility] (cervical precautions) Thoracic Assessment Thoracic Assessment: Exceptions to [facility] (rounded shoulders) Lumbar Assessment Lumbar Assessment: Within Functional Limits (posterior pelvic tilt) Postural Control Postural Control: Deficits on evaluation Protective Responses: delayed/impaired  Balance Balance Balance Assessed: Yes Static Sitting Balance Static Sitting - Balance Support: Feet unsupported Static Sitting - Level of Assistance: 6: Modified independent (Device/Increase time) Dynamic Sitting Balance Dynamic Sitting - Balance Support: During functional activity Dynamic Sitting - Level of Assistance: 5: Stand by assistance Dynamic Sitting - Balance Activities: Reaching for objects Sitting balance - Comments: supervision for w/c brakes; impulsive Static Standing Balance Static Standing - Level of Assistance: 5: Stand by assistance Dynamic Standing Balance Dynamic Standing - Level of Assistance: 5: Stand by assistance Extremity/Trunk Assessment RUE Assessment RUE Assessment: Within Functional Limits LUE Assessment LUE Assessment: Within Functional Limits   See Function Navigator for Current Functional Status.  Phineas Semen 02/19/2016, 4:48 PM
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
## 2016-02-19 NOTE — Progress Notes (Signed)
Patient received Hycet for c/o neck pain as ordered at 0441.  Patient then went back to sleep.  Was then observed by this staff member at 0610 ambulating in room and opening a can of Pepsi, then sitting on the side of her bed playing a game on her cell phone.  Patient called for more pain medication at 0630.  Explained to patient that she had received her pain medication already.  Patient then stated, " Well if I was sleeping, how did you give it to me?"  Explained to her that she received her medication before going back to sleep.  Patient then stated , " Oh.  Well just tell the doctor to order me something stronger.  He is going to send me home with some narcotics ain't he?"   Patient currently on bed, legs crossed, holding cell phone in front of her playing a video game.  No signs of discomfort or distress assessed.    [name], [name]

## 2016-02-20 DIAGNOSIS — F4321 Adjustment disorder with depressed mood: Secondary | ICD-10-CM | POA: Insufficient documentation

## 2016-02-20 NOTE — Consult Note (Signed)
PSYCHODIAGNOSTIC EVALUATION - [facility]   Ms. [name] is a 45 year old, Caucasian woman, who was seen for a psychodiagnostic evaluation to assess her emotional functioning in the setting of traumatic brain injury.  According to staff members, she had displayed poor impulse control, including behavioral outbursts.    During today's session, Ms. [name] reported that she has been irritable and noted paranoid ideation that staff members were attempting to keep her in the hospital without cause and that they are lying to her.  Her speech was pressured/rapid and she was tearful when discussing that she would like to be back with her children.  She denied symptoms of clinical depression and stated that she sleeps well (approximately 8 hours per night) and has sufficient energy.  She commented that she looks forward to physical therapy, as it provides her with an outlet for her energy.  She denied symptoms of mania.  She also denied suicidal ideation, but of note, her medical record reflects prior hospitalization (2009) following suicide attempt in which she overdosed on Prednisone and Reglan.  Ms. [name] also denied drug use, but her medical chart reflects history of polysubstance abuse.  She said that she used to drink alcohol; she commented that she plans to be sober from alcohol following this hospitalization.  She also stated that she plans to quit smoking after this (she previously reduced cigarette use to half of a pack daily).  Today, Ms. [name] said that she would like staff members to give her reasons why she needs to stay.  Overall, she stated that she is ready for discharge and threatened to "get the police" up to the unit if she was not discharged.  Following discharge, she stated that her daughter will take care of her at her mother's house.  She commented that she has sufficient social support, but was open to participating in individual psychotherapy  post-discharge.  She may also require treatment with a psychiatrist.  From a cognitive perspective, Ms. [name] denied noticing problems with memory or other thinking skills.    IMPRESSION:  Ms. [name] displayed symptoms of paranoid ideation as well as some manic symptoms (e.g. pressured speech, agitation, impulsivity, etc.) but denied history of manic symptoms that could be used to properly diagnose bipolar disorder. However, she was a poor historian and it is possible that she was denying symptoms to avoid diagnosis or treatment.  She certainly seems to be having irritable mood, which likely represents at minimum an adjustment disorder with depression.  Her behaviors could also represent sequelae from her head injury, which included evidence of contusion in the left frontal lobe.  Although unlikely, it is also unclear what impact withdrawal from recent substance abuse may be playing in influencing her current behavior.  Still, her psychological diagnosis remains ambiguous.  It is recommended that she engage in individual psychotherapy and treatment with a psychiatrist following discharge from the inpatient unit.    DIAGNOSES:   Adjustment disorder with depressed mood R/O Bipolar disorder  Leavy CellaKaren Shalunda Lindh, Psy.D.  Clinical Neuropsychologist

## 2016-02-21 ENCOUNTER — Telehealth (HOSPITAL_COMMUNITY): Payer: Self-pay

## 2016-02-21 NOTE — Progress Notes (Signed)
Contacted pt's mother, Reina FuseMary Williamson, to confirm that I was able to set up a new patient appointment for [name] on 02/23/16 @ 2:30 PM.  Mother has phone and directions.  Yaris Ferrell, LCSW

## 2016-02-22 ENCOUNTER — Telehealth: Payer: Self-pay | Admitting: Physical Medicine & Rehabilitation

## 2016-02-22 NOTE — Telephone Encounter (Signed)
Patient is calling to request pain medication 

## 2016-02-22 NOTE — Telephone Encounter (Signed)
I spoke with Mrs. [name]. The patient was discharged on tramadol. I am absolutely NOT prescribing her C2 narcotics given her abuse history.

## 2016-02-22 NOTE — Telephone Encounter (Signed)
April KraftWilma Wong with [facility] needs our office to call her about patients pain medication.  Her number is [phone].

## 2016-02-22 NOTE — Telephone Encounter (Signed)
I contacted [name] and informed that Dr. Riley KillSwartz would not prescribe any medications stronger than tramadol.  She said that is what she has now and it does not work.  She went onto complain about her stomach (port site) about discharge and feeling of hardness.  I recommended to the patient that it would be best to get that checked out at the ED

## 2016-02-23 ENCOUNTER — Ambulatory Visit: Payer: MEDICAID | Attending: Internal Medicine | Admitting: Physician Assistant

## 2016-02-23 ENCOUNTER — Encounter: Payer: Self-pay | Admitting: Physician Assistant

## 2016-02-23 VITALS — BP 132/84 | HR 112 | Temp 98.2°F | Resp 16 | Wt 167.8 lb

## 2016-02-23 DIAGNOSIS — S129XXA Fracture of neck, unspecified, initial encounter: Secondary | ICD-10-CM

## 2016-02-23 DIAGNOSIS — I10 Essential (primary) hypertension: Secondary | ICD-10-CM

## 2016-02-23 DIAGNOSIS — R451 Restlessness and agitation: Secondary | ICD-10-CM

## 2016-02-23 DIAGNOSIS — A0472 Enterocolitis due to Clostridium difficile, not specified as recurrent: Secondary | ICD-10-CM

## 2016-02-23 DIAGNOSIS — A047 Enterocolitis due to Clostridium difficile: Secondary | ICD-10-CM

## 2016-02-23 DIAGNOSIS — R739 Hyperglycemia, unspecified: Secondary | ICD-10-CM

## 2016-02-23 LAB — POCT GLYCOSYLATED HEMOGLOBIN (HGB A1C): Hemoglobin A1C: 5.2

## 2016-02-23 MED ORDER — ACETAMINOPHEN-CODEINE #3 300-30 MG PO TABS
1.0000 | ORAL_TABLET | ORAL | Status: DC | PRN
Start: 2016-02-23 — End: 2019-10-02

## 2016-02-23 MED FILL — ACETAMINOPHEN/COD #3 TABLET: 300-30 | 1 days supply | Qty: 10 | Fill #0

## 2016-02-23 NOTE — Patient Instructions (Addendum)
Check Blood pressure 2 X daily and record and bring to f/up appointment  Dr. Ashok PallBotero-neurosurgeon Dr. Murphy-orthopedist Dr. Marvia PicklesWyatt-surgeon
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
## 2016-02-27 ENCOUNTER — Telehealth: Payer: Self-pay | Admitting: Physical Medicine & Rehabilitation

## 2016-02-27 ENCOUNTER — Telehealth: Payer: Self-pay | Admitting: General Practice

## 2016-02-27 NOTE — Telephone Encounter (Signed)
Pt. Called requesting a refill on Tylenol # 3. Pt. Is requesting 10 more pills b/c she does not have any left. Please f/u with pt.

## 2016-02-27 NOTE — Telephone Encounter (Signed)
Delacruz.Gullinghanda PT with [facility] could not do start of care, because patient could not be found or contacted.  She has tried all numbers that was given to her and also did drive by house.  Any questions please call her at [phone].

## 2016-02-27 NOTE — Telephone Encounter (Signed)
Called pt twice, hung up phone call before I could speak with her about therapy.

## 2016-02-27 NOTE — Telephone Encounter (Signed)
FYI

## 2016-02-29 ENCOUNTER — Telehealth: Payer: Self-pay

## 2016-02-29 ENCOUNTER — Telehealth: Payer: Self-pay | Admitting: Physical Medicine & Rehabilitation

## 2016-02-29 ENCOUNTER — Encounter: Payer: Self-pay | Admitting: Physical Medicine & Rehabilitation

## 2016-02-29 MED ORDER — QUETIAPINE FUMARATE 50 MG PO TABS: 150.0000 mg | ORAL_TABLET | Freq: Three times a day (TID) | ORAL | Status: DC

## 2016-02-29 NOTE — Telephone Encounter (Signed)
Pt returning call

## 2016-02-29 NOTE — Telephone Encounter (Signed)
Patient is calling about her medications that were supposed to be called into her pharmacy, her blood pressure medication and Seroquel.  Patient is saying that her blood pressure is high and she needs this medication.  She is also calling about her home health PT and SN.  She said that someone called her last week but never came out.  Can someone please call patient back at [phone].

## 2016-02-29 NOTE — Telephone Encounter (Signed)
Pt needs an rx of Seroquel. She never received the rx for seroquel from the hospital. I advised pt that her internal medicine PA is handling the BP med, according to her last office visit note. I have left a message with Lynford HumphreyShanda, a PT from [facility], to return call in regards to therapy being set up for the patient.

## 2016-02-29 NOTE — Telephone Encounter (Signed)
We will not be prescribing pain medications for the patient. As I discussed with her at her office visit, pain medications will need to be managed by the neurosurgeon or orthopedists following her for her fractures or we can make a referral to pain management if needed.  One physician needs to prescribe and follow her pain medications.   Thanks, Georgian CoAngela McClung, PA-C

## 2016-02-29 NOTE — Telephone Encounter (Signed)
Pt did not answer lvm for pt to give me a call back at her earliest convenience

## 2016-02-29 NOTE — Telephone Encounter (Signed)
Pt returned call and i spoke with pt and gave her Dr. Barbie BannerMccClung advice patient stated she wasn't trying to get pain medication she was trying to get her blood pressure medication and seroquel. Per Dr. Sharon SellerMcClung pt is suppose to be checking bp 1-2x a day and recording it and bring it with her to her follow up appt and then that's when Dr. Sharon SellerMcclung will decide if she needs bp and for the seroquel i informed pt that it was just filled by Dr. Riley KillSwartz. Pt is aware and will continue to record bp

## 2016-02-29 NOTE — Telephone Encounter (Signed)
[facility] [place]. Pt is aware.

## 2016-02-29 NOTE — Telephone Encounter (Signed)
Shanda-PT with [facility]- returned call. She states that the pt could not be located and the phone numbers were not in service. Therefore-[facility] Lynford HumphreyShanda states that if pt is now ready for services, we need to put in a new order. Please advise on new order.

## 2016-03-01 ENCOUNTER — Telehealth (HOSPITAL_COMMUNITY): Payer: Self-pay | Admitting: *Deleted

## 2016-03-01 NOTE — Progress Notes (Signed)
CSW has received multiple calls from pt over the last two days asking for assistance with her medications - Seroquel and BP med.  Pt also with HH questions.  CSW has reviewed notes here in EPIC from other providers.  CSW explained that she will need [facility] and pt has already reached out to Dr. Rosalyn ChartersSwartz's office for Seroquel.  The office did call in pt's Seroquel to her home pharmacy, [facility] in [place], however then pt called CSW again stating that she could not afford the Seroquel.  CSW called Nicolasa DuckingSally Holland, Care Management, and she was able to override the dates [facility] to give pt a little more time to obtain the medication since pt was supposed to be given the prescription at d/c.  [facility] and instructed pt to give it a little time to go through and she can check with them first before heading to pharmacy.  CSW then told pt that this only works for one month supply and that she will need to f[facility] prior to needing medication refills.  CSW also called [facility] and they are going to re-initiate care and CSW made sure they have pt's mother's address and phone number.  Told pt this.  CSW also kindly encouraged/instructed pt to be pt when making phone calls and requests of health care providers, telling her that it takes some time to get things done and that multiple messages and phone calls to multiple providers does not make things go faster and may actually make things more complicated and take more time to accomplish.  She expressed understanding over the phone.  Pt was appreciative of the efforts of everyone (as is this CSW) to obtain the Seroquel and re-initiate the home health for ST/OT/PT.  CSW noted in the chart that [facility] will f/u with pt re: BP monitoring and they will start a BP med, if they feel it is warranted.

## 2016-03-01 NOTE — Telephone Encounter (Signed)
Spoke with angela

## 2016-03-04 NOTE — Telephone Encounter (Signed)
This patient will not be given narcotic meds.

## 2016-03-04 NOTE — Telephone Encounter (Signed)
Donalee CitrinJANET FORD - [facility] [phone] Calling about patient G TUBE - has a discharge There is an issue with her medications - she is running out, patient claims she is in pain and wasn't given enough meds - they advised the patient that it was controlled substance He blood pressure was elevated over the weekend 140/90  Currently patient states that she is out of meds

## 2016-03-05 ENCOUNTER — Telehealth: Payer: Self-pay | Admitting: Physical Medicine & Rehabilitation

## 2016-03-05 NOTE — Telephone Encounter (Signed)
Any directions about g tube discharge?  They do see Dr Allena KatzPatel on 03/07/16
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
## 2016-03-05 NOTE — Telephone Encounter (Signed)
Patient is calling about her blood pressure medications.  She states her blood pressure has been high.  Please call her at [phone].

## 2016-03-06 NOTE — Telephone Encounter (Signed)
Contacted patient, advised her to come into appt tomorrow and speak with the physician face to face
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
## 2016-03-06 NOTE — Telephone Encounter (Signed)
She has appt 03/07/16 with Allena KatzPatel

## 2016-03-07 ENCOUNTER — Encounter: Payer: Self-pay | Admitting: Physical Medicine & Rehabilitation

## 2016-03-07 ENCOUNTER — Encounter: Payer: Medicaid Other | Attending: Physical Medicine & Rehabilitation | Admitting: Physical Medicine & Rehabilitation

## 2016-03-07 VITALS — BP 109/75 | HR 96

## 2016-03-07 DIAGNOSIS — I615 Nontraumatic intracerebral hemorrhage, intraventricular: Secondary | ICD-10-CM | POA: Insufficient documentation

## 2016-03-07 DIAGNOSIS — S069X0D Unspecified intracranial injury without loss of consciousness, subsequent encounter: Secondary | ICD-10-CM | POA: Diagnosis present

## 2016-03-07 DIAGNOSIS — S32501S Unspecified fracture of right pubis, sequela: Secondary | ICD-10-CM

## 2016-03-07 DIAGNOSIS — F319 Bipolar disorder, unspecified: Secondary | ICD-10-CM | POA: Diagnosis not present

## 2016-03-07 DIAGNOSIS — R451 Restlessness and agitation: Secondary | ICD-10-CM

## 2016-03-07 DIAGNOSIS — F1410 Cocaine abuse, uncomplicated: Secondary | ICD-10-CM

## 2016-03-07 DIAGNOSIS — S3219XS Other fracture of sacrum, sequela: Secondary | ICD-10-CM

## 2016-03-07 DIAGNOSIS — S22019D Unspecified fracture of first thoracic vertebra, subsequent encounter for fracture with routine healing: Secondary | ICD-10-CM | POA: Diagnosis not present

## 2016-03-07 DIAGNOSIS — S329XXD Fracture of unspecified parts of lumbosacral spine and pelvis, subsequent encounter for fracture with routine healing: Secondary | ICD-10-CM | POA: Insufficient documentation

## 2016-03-07 DIAGNOSIS — S3210XD Unspecified fracture of sacrum, subsequent encounter for fracture with routine healing: Secondary | ICD-10-CM | POA: Diagnosis not present

## 2016-03-07 DIAGNOSIS — F4323 Adjustment disorder with mixed anxiety and depressed mood: Secondary | ICD-10-CM

## 2016-03-07 DIAGNOSIS — F4310 Post-traumatic stress disorder, unspecified: Secondary | ICD-10-CM | POA: Diagnosis not present

## 2016-03-07 DIAGNOSIS — S2242XS Multiple fractures of ribs, left side, sequela: Secondary | ICD-10-CM

## 2016-03-07 DIAGNOSIS — Z5189 Encounter for other specified aftercare: Secondary | ICD-10-CM | POA: Insufficient documentation

## 2016-03-07 DIAGNOSIS — S12600D Unspecified displaced fracture of seventh cervical vertebra, subsequent encounter for fracture with routine healing: Secondary | ICD-10-CM | POA: Insufficient documentation

## 2016-03-07 DIAGNOSIS — F419 Anxiety disorder, unspecified: Secondary | ICD-10-CM

## 2016-03-07 DIAGNOSIS — S134XXS Sprain of ligaments of cervical spine, sequela: Secondary | ICD-10-CM

## 2016-03-07 DIAGNOSIS — F1010 Alcohol abuse, uncomplicated: Secondary | ICD-10-CM

## 2016-03-07 DIAGNOSIS — G8929 Other chronic pain: Secondary | ICD-10-CM | POA: Diagnosis present

## 2016-03-07 DIAGNOSIS — S061X3S Traumatic cerebral edema with loss of consciousness of 1 hour to 5 hours 59 minutes, sequela: Secondary | ICD-10-CM

## 2016-03-07 DIAGNOSIS — S32591S Other specified fracture of right pubis, sequela: Secondary | ICD-10-CM

## 2016-03-07 DIAGNOSIS — F1910 Other psychoactive substance abuse, uncomplicated: Secondary | ICD-10-CM | POA: Diagnosis not present

## 2016-03-07 DIAGNOSIS — S3210XS Unspecified fracture of sacrum, sequela: Secondary | ICD-10-CM

## 2016-03-07 MED ORDER — DIVALPROEX SODIUM 125 MG PO CSDR: 500.0000 mg | DELAYED_RELEASE_CAPSULE | Freq: Three times a day (TID) | ORAL | Status: DC
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
## 2016-03-20 ENCOUNTER — Other Ambulatory Visit (HOSPITAL_COMMUNITY): Payer: Self-pay | Admitting: Neurosurgery

## 2016-03-20 DIAGNOSIS — M542 Cervicalgia: Secondary | ICD-10-CM

## 2016-03-25 ENCOUNTER — Ambulatory Visit (HOSPITAL_COMMUNITY): Payer: MEDICAID

## 2016-03-25 ENCOUNTER — Ambulatory Visit (HOSPITAL_COMMUNITY): Admission: RE | Admit: 2016-03-25 | Payer: MEDICAID | Source: Ambulatory Visit

## 2016-04-01 ENCOUNTER — Ambulatory Visit (HOSPITAL_COMMUNITY): Admission: RE | Admit: 2016-04-01 | Payer: MEDICAID | Source: Ambulatory Visit

## 2016-04-03 ENCOUNTER — Encounter (HOSPITAL_COMMUNITY): Payer: Self-pay | Admitting: Radiology

## 2016-04-03 ENCOUNTER — Ambulatory Visit (HOSPITAL_COMMUNITY)
Admission: RE | Admit: 2016-04-03 | Discharge: 2016-04-03 | Disposition: A | Discharge status: Home / Self Care | Payer: Medicaid Other | Source: Ambulatory Visit | Attending: Neurosurgery | Admitting: Neurosurgery

## 2016-04-03 DIAGNOSIS — M542 Cervicalgia: Secondary | ICD-10-CM | POA: Insufficient documentation

## 2016-04-03 DIAGNOSIS — R9089 Other abnormal findings on diagnostic imaging of central nervous system: Secondary | ICD-10-CM | POA: Insufficient documentation

## 2016-04-08 ENCOUNTER — Ambulatory Visit: Payer: Self-pay | Admitting: Internal Medicine

## 2016-04-17 ENCOUNTER — Other Ambulatory Visit: Payer: Self-pay | Admitting: Physical Medicine & Rehabilitation

## 2016-04-24 ENCOUNTER — Other Ambulatory Visit: Payer: Self-pay

## 2016-04-24 MED ORDER — QUETIAPINE FUMARATE 50 MG PO TABS: 50.0000 mg | ORAL_TABLET | Freq: Three times a day (TID) | ORAL | 0 refills | Status: DC

## 2016-04-24 MED ORDER — TRAZODONE HCL 50 MG PO TABS: 50.0000 mg | ORAL_TABLET | Freq: Every evening | ORAL | 0 refills | Status: DC | PRN

## 2016-04-24 NOTE — Telephone Encounter (Signed)
Pt requested a refill for Seroquel. Refill sent. Pt made aware.

## 2016-04-24 NOTE — Telephone Encounter (Signed)
Pt requesting a refill on Trazodone. Trazodone sent to pharmacy.

## 2016-06-05 ENCOUNTER — Encounter: Payer: Medicaid Other | Attending: Physical Medicine & Rehabilitation | Admitting: Physical Medicine & Rehabilitation

## 2016-06-05 DIAGNOSIS — S069X0D Unspecified intracranial injury without loss of consciousness, subsequent encounter: Secondary | ICD-10-CM | POA: Insufficient documentation

## 2016-06-05 DIAGNOSIS — S329XXD Fracture of unspecified parts of lumbosacral spine and pelvis, subsequent encounter for fracture with routine healing: Secondary | ICD-10-CM | POA: Insufficient documentation

## 2016-06-05 DIAGNOSIS — I615 Nontraumatic intracerebral hemorrhage, intraventricular: Secondary | ICD-10-CM | POA: Insufficient documentation

## 2016-06-05 DIAGNOSIS — F1910 Other psychoactive substance abuse, uncomplicated: Secondary | ICD-10-CM | POA: Insufficient documentation

## 2016-06-05 DIAGNOSIS — S3210XD Unspecified fracture of sacrum, subsequent encounter for fracture with routine healing: Secondary | ICD-10-CM | POA: Insufficient documentation

## 2016-06-05 DIAGNOSIS — Z5189 Encounter for other specified aftercare: Secondary | ICD-10-CM | POA: Insufficient documentation

## 2016-06-05 DIAGNOSIS — S12600D Unspecified displaced fracture of seventh cervical vertebra, subsequent encounter for fracture with routine healing: Secondary | ICD-10-CM | POA: Insufficient documentation

## 2016-06-05 DIAGNOSIS — G8929 Other chronic pain: Secondary | ICD-10-CM | POA: Insufficient documentation

## 2016-06-05 DIAGNOSIS — F4310 Post-traumatic stress disorder, unspecified: Secondary | ICD-10-CM | POA: Insufficient documentation

## 2016-06-05 DIAGNOSIS — S22019D Unspecified fracture of first thoracic vertebra, subsequent encounter for fracture with routine healing: Secondary | ICD-10-CM | POA: Insufficient documentation

## 2016-06-05 DIAGNOSIS — F319 Bipolar disorder, unspecified: Secondary | ICD-10-CM | POA: Insufficient documentation
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
## 2019-09-25 ENCOUNTER — Emergency Department (HOSPITAL_COMMUNITY): Payer: Self-pay

## 2019-09-25 ENCOUNTER — Inpatient Hospital Stay (HOSPITAL_COMMUNITY)
Admission: EM | Admit: 2019-09-25 | Discharge: 2019-10-01 | DRG: 958 | Disposition: A | Discharge status: Other Acute Inpatient Hospital | Payer: Self-pay | Attending: General Surgery | Admitting: General Surgery

## 2019-09-25 ENCOUNTER — Other Ambulatory Visit: Payer: Self-pay

## 2019-09-25 ENCOUNTER — Encounter (HOSPITAL_COMMUNITY)
Admission: EM | Disposition: A | Discharge status: Other Acute Inpatient Hospital | Payer: Self-pay | Source: Home / Self Care

## 2019-09-25 ENCOUNTER — Encounter (HOSPITAL_COMMUNITY): Payer: Self-pay

## 2019-09-25 ENCOUNTER — Inpatient Hospital Stay (HOSPITAL_COMMUNITY): Payer: Self-pay | Admitting: Certified Registered"

## 2019-09-25 ENCOUNTER — Inpatient Hospital Stay (HOSPITAL_COMMUNITY): Payer: Self-pay

## 2019-09-25 DIAGNOSIS — S0181XA Laceration without foreign body of other part of head, initial encounter: Secondary | ICD-10-CM | POA: Diagnosis present

## 2019-09-25 DIAGNOSIS — F319 Bipolar disorder, unspecified: Secondary | ICD-10-CM | POA: Diagnosis present

## 2019-09-25 DIAGNOSIS — J9811 Atelectasis: Secondary | ICD-10-CM

## 2019-09-25 DIAGNOSIS — Y9259 Other trade areas as the place of occurrence of the external cause: Secondary | ICD-10-CM

## 2019-09-25 DIAGNOSIS — R45851 Suicidal ideations: Secondary | ICD-10-CM | POA: Diagnosis present

## 2019-09-25 DIAGNOSIS — D62 Acute posthemorrhagic anemia: Secondary | ICD-10-CM | POA: Diagnosis not present

## 2019-09-25 DIAGNOSIS — S37001A Unspecified injury of right kidney, initial encounter: Secondary | ICD-10-CM

## 2019-09-25 DIAGNOSIS — Z20822 Contact with and (suspected) exposure to covid-19: Secondary | ICD-10-CM | POA: Diagnosis present

## 2019-09-25 DIAGNOSIS — R0689 Other abnormalities of breathing: Secondary | ICD-10-CM

## 2019-09-25 DIAGNOSIS — S52021A Displaced fracture of olecranon process without intraarticular extension of right ulna, initial encounter for closed fracture: Secondary | ICD-10-CM

## 2019-09-25 DIAGNOSIS — E876 Hypokalemia: Secondary | ICD-10-CM | POA: Diagnosis present

## 2019-09-25 DIAGNOSIS — W19XXXA Unspecified fall, initial encounter: Secondary | ICD-10-CM

## 2019-09-25 DIAGNOSIS — S36113A Laceration of liver, unspecified degree, initial encounter: Secondary | ICD-10-CM | POA: Diagnosis present

## 2019-09-25 DIAGNOSIS — S52031B Displaced fracture of olecranon process with intraarticular extension of right ulna, initial encounter for open fracture type I or II: Principal | ICD-10-CM | POA: Diagnosis present

## 2019-09-25 DIAGNOSIS — S35401A Unspecified injury of right renal artery, initial encounter: Secondary | ICD-10-CM | POA: Diagnosis present

## 2019-09-25 DIAGNOSIS — S35491A Other specified injury of right renal artery, initial encounter: Secondary | ICD-10-CM

## 2019-09-25 DIAGNOSIS — J449 Chronic obstructive pulmonary disease, unspecified: Secondary | ICD-10-CM | POA: Diagnosis present

## 2019-09-25 DIAGNOSIS — F332 Major depressive disorder, recurrent severe without psychotic features: Secondary | ICD-10-CM | POA: Diagnosis present

## 2019-09-25 DIAGNOSIS — Z79899 Other long term (current) drug therapy: Secondary | ICD-10-CM

## 2019-09-25 DIAGNOSIS — R739 Hyperglycemia, unspecified: Secondary | ICD-10-CM | POA: Diagnosis not present

## 2019-09-25 DIAGNOSIS — S0101XA Laceration without foreign body of scalp, initial encounter: Secondary | ICD-10-CM | POA: Diagnosis present

## 2019-09-25 DIAGNOSIS — Z7982 Long term (current) use of aspirin: Secondary | ICD-10-CM

## 2019-09-25 DIAGNOSIS — W130XXA Fall from, out of or through balcony, initial encounter: Secondary | ICD-10-CM | POA: Diagnosis present

## 2019-09-25 DIAGNOSIS — T1490XA Injury, unspecified, initial encounter: Secondary | ICD-10-CM

## 2019-09-25 DIAGNOSIS — F10129 Alcohol abuse with intoxication, unspecified: Secondary | ICD-10-CM | POA: Diagnosis present

## 2019-09-25 DIAGNOSIS — F1410 Cocaine abuse, uncomplicated: Secondary | ICD-10-CM | POA: Diagnosis present

## 2019-09-25 DIAGNOSIS — F1010 Alcohol abuse, uncomplicated: Secondary | ICD-10-CM | POA: Diagnosis present

## 2019-09-25 DIAGNOSIS — F17210 Nicotine dependence, cigarettes, uncomplicated: Secondary | ICD-10-CM | POA: Diagnosis present

## 2019-09-25 HISTORY — PX: ORIF ELBOW FRACTURE: SHX5031

## 2019-09-25 HISTORY — PX: LOWER EXTREMITY ANGIOGRAM: SHX5508

## 2019-09-25 LAB — I-STAT CHEM 8, ED
BUN: 6 mg/dL (ref 6–20)
Calcium, Ion: 0.99 mmol/L — ABNORMAL LOW (ref 1.15–1.40)
Chloride: 100 mmol/L (ref 98–111)
Creatinine, Ser: 1 mg/dL (ref 0.44–1.00)
Glucose, Bld: 131 mg/dL — ABNORMAL HIGH (ref 70–99)
HCT: 45 % (ref 36.0–46.0)
Hemoglobin: 15.3 g/dL — ABNORMAL HIGH (ref 12.0–15.0)
Potassium: 3.2 mmol/L — ABNORMAL LOW (ref 3.5–5.1)
Sodium: 134 mmol/L — ABNORMAL LOW (ref 135–145)
TCO2: 21 mmol/L — ABNORMAL LOW (ref 22–32)

## 2019-09-25 LAB — CBC
HCT: 43.6 % (ref 36.0–46.0)
HCT: 35.1 % — ABNORMAL LOW (ref 36.0–46.0)
Hemoglobin: 14.7 g/dL (ref 12.0–15.0)
Hemoglobin: 11.8 g/dL — ABNORMAL LOW (ref 12.0–15.0)
MCH: 32.8 pg (ref 26.0–34.0)
MCH: 32.7 pg (ref 26.0–34.0)
MCHC: 33.7 g/dL (ref 30.0–36.0)
MCHC: 33.6 g/dL (ref 30.0–36.0)
MCV: 97.3 fL (ref 80.0–100.0)
MCV: 97.2 fL (ref 80.0–100.0)
Platelets: 359 10*3/uL (ref 150–400)
Platelets: 288 10*3/uL (ref 150–400)
RBC: 4.48 MIL/uL (ref 3.87–5.11)
RBC: 3.61 MIL/uL — ABNORMAL LOW (ref 3.87–5.11)
RDW: 12.5 % (ref 11.5–15.5)
RDW: 12.7 % (ref 11.5–15.5)
WBC: 13.7 10*3/uL — ABNORMAL HIGH (ref 4.0–10.5)
WBC: 14.8 10*3/uL — ABNORMAL HIGH (ref 4.0–10.5)
nRBC: 0 % (ref 0.0–0.2)
nRBC: 0 % (ref 0.0–0.2)

## 2019-09-25 LAB — RAPID URINE DRUG SCREEN, HOSP PERFORMED
Amphetamines: NOT DETECTED
Barbiturates: NOT DETECTED
Benzodiazepines: NOT DETECTED
Cocaine: POSITIVE — AB
Opiates: NOT DETECTED
Tetrahydrocannabinol: NOT DETECTED

## 2019-09-25 LAB — URINALYSIS, ROUTINE W REFLEX MICROSCOPIC
Bacteria, UA: NONE SEEN
Bilirubin Urine: NEGATIVE
Glucose, UA: NEGATIVE mg/dL
Ketones, ur: 5 mg/dL — AB
Leukocytes,Ua: NEGATIVE
Nitrite: NEGATIVE
Protein, ur: 100 mg/dL — AB
Specific Gravity, Urine: >1.046 — ABNORMAL HIGH (ref 1.005–1.030)
pH: 5 (ref 5.0–8.0)

## 2019-09-25 LAB — SARS CORONAVIRUS 2 (TAT 6-24 HRS): SARS Coronavirus 2: NEGATIVE

## 2019-09-25 LAB — POCT I-STAT 7, (LYTES, BLD GAS, ICA,H+H)
Acid-base deficit: 2 mmol/L (ref 0.0–2.0)
Bicarbonate: 24.4 mmol/L (ref 20.0–28.0)
Calcium, Ion: 1.17 mmol/L (ref 1.15–1.40)
HCT: 36 % (ref 36.0–46.0)
Hemoglobin: 12.2 g/dL (ref 12.0–15.0)
O2 Saturation: 72 %
Patient temperature: 37
Potassium: 4.5 mmol/L (ref 3.5–5.1)
Sodium: 135 mmol/L (ref 135–145)
TCO2: 26 mmol/L (ref 22–32)
pCO2 arterial: 49.5 mmHg — ABNORMAL HIGH (ref 32.0–48.0)
pH, Arterial: 7.3 — ABNORMAL LOW (ref 7.350–7.450)
pO2, Arterial: 42 mmHg — ABNORMAL LOW (ref 83.0–108.0)

## 2019-09-25 LAB — COMPREHENSIVE METABOLIC PANEL
ALT: 378 U/L — ABNORMAL HIGH (ref 0–44)
AST: 605 U/L — ABNORMAL HIGH (ref 15–41)
Albumin: 4.2 g/dL (ref 3.5–5.0)
Alkaline Phosphatase: 104 U/L (ref 38–126)
Anion gap: 17 — ABNORMAL HIGH (ref 5–15)
BUN: 7 mg/dL (ref 6–20)
CO2: 18 mmol/L — ABNORMAL LOW (ref 22–32)
Calcium: 8.9 mg/dL (ref 8.9–10.3)
Chloride: 99 mmol/L (ref 98–111)
Creatinine, Ser: 0.79 mg/dL (ref 0.44–1.00)
GFR calc Af Amer: >60 mL/min (ref >60)
GFR calc non Af Amer: >60 mL/min (ref >60)
Glucose, Bld: 135 mg/dL — ABNORMAL HIGH (ref 70–99)
Potassium: 3.4 mmol/L — ABNORMAL LOW (ref 3.5–5.1)
Sodium: 134 mmol/L — ABNORMAL LOW (ref 135–145)
Total Bilirubin: 0.5 mg/dL (ref 0.3–1.2)
Total Protein: 6.9 g/dL (ref 6.5–8.1)

## 2019-09-25 LAB — LACTIC ACID, PLASMA
Lactic Acid, Venous: 3.2 mmol/L (ref 0.5–1.9)
Lactic Acid, Venous: 1.6 mmol/L (ref 0.5–1.9)

## 2019-09-25 LAB — SAMPLE TO BLOOD BANK

## 2019-09-25 LAB — ETHANOL: Alcohol, Ethyl (B): 197 mg/dL — ABNORMAL HIGH (ref <10)

## 2019-09-25 LAB — PROTIME-INR
INR: 1 (ref 0.8–1.2)
Prothrombin Time: 13.4 seconds (ref 11.4–15.2)

## 2019-09-25 LAB — HIV ANTIBODY (ROUTINE TESTING W REFLEX): HIV Screen 4th Generation wRfx: NONREACTIVE

## 2019-09-25 LAB — HCG, QUANTITATIVE, PREGNANCY: hCG, Beta Chain, Quant, S: 1 m[IU]/mL (ref <5)

## 2019-09-25 SURGERY — ANGIOGRAM, LOWER EXTREMITY
Anesthesia: General | Site: Elbow | Laterality: Right

## 2019-09-25 SURGERY — OPEN REDUCTION INTERNAL FIXATION (ORIF) ELBOW/OLECRANON FRACTURE
Anesthesia: General | Site: Elbow | Laterality: Right

## 2019-09-25 MED ORDER — ACETAMINOPHEN 325 MG PO TABS: 650.0000 mg | ORAL_TABLET | ORAL | Status: DC | PRN

## 2019-09-25 MED ORDER — ALBUTEROL SULFATE (2.5 MG/3ML) 0.083% IN NEBU
INHALATION_SOLUTION | RESPIRATORY_TRACT | Status: AC
  Filled 2019-09-25: qty 3

## 2019-09-25 MED ORDER — DEXAMETHASONE SODIUM PHOSPHATE 10 MG/ML IJ SOLN
INTRAMUSCULAR | Status: AC
  Filled 2019-09-25: qty 1

## 2019-09-25 MED ORDER — ASPIRIN EC 81 MG PO TBEC: 81.0000 mg | DELAYED_RELEASE_TABLET | Freq: Once | ORAL | Status: DC

## 2019-09-25 MED ORDER — ALBUTEROL SULFATE HFA 108 (90 BASE) MCG/ACT IN AERS
INHALATION_SPRAY | RESPIRATORY_TRACT | Status: DC | PRN
  Administered 2019-09-25: 4 via RESPIRATORY_TRACT

## 2019-09-25 MED ORDER — SODIUM CHLORIDE 0.9 % IV BOLUS
1000.0000 mL | Freq: Once | INTRAVENOUS | Status: AC
  Administered 2019-09-25: 1000 mL via INTRAVENOUS

## 2019-09-25 MED ORDER — SUCCINYLCHOLINE CHLORIDE 200 MG/10ML IV SOSY
PREFILLED_SYRINGE | INTRAVENOUS | Status: DC | PRN
  Administered 2019-09-25: 160 mg via INTRAVENOUS

## 2019-09-25 MED ORDER — BUPIVACAINE HCL (PF) 0.25 % IJ SOLN
INTRAMUSCULAR | Status: AC
  Filled 2019-09-25: qty 30

## 2019-09-25 MED ORDER — TETANUS-DIPHTH-ACELL PERTUSSIS 5-2.5-18.5 LF-MCG/0.5 IM SUSP
0.5000 mL | Freq: Once | INTRAMUSCULAR | Status: AC
  Administered 2019-09-25: 01:00:00 0.5 mL via INTRAMUSCULAR
  Filled 2019-09-25: qty 0.5

## 2019-09-25 MED ORDER — SODIUM CHLORIDE 0.9 % IV SOLN
INTRAVENOUS | Status: AC
  Filled 2019-09-25: qty 1.2

## 2019-09-25 MED ORDER — ONDANSETRON HCL 4 MG/2ML IJ SOLN
INTRAMUSCULAR | Status: DC | PRN
  Administered 2019-09-25: 4 mg via INTRAVENOUS

## 2019-09-25 MED ORDER — SODIUM CHLORIDE 0.9 % IV SOLN: INTRAVENOUS | Status: AC

## 2019-09-25 MED ORDER — IOHEXOL 300 MG/ML  SOLN
100.0000 mL | Freq: Once | INTRAMUSCULAR | Status: AC | PRN
  Administered 2019-09-25: 100 mL via INTRAVENOUS

## 2019-09-25 MED ORDER — FENTANYL CITRATE (PF) 100 MCG/2ML IJ SOLN
75.0000 ug | Freq: Once | INTRAMUSCULAR | Status: AC
  Administered 2019-09-25: 75 ug via INTRAVENOUS
  Filled 2019-09-25: qty 2

## 2019-09-25 MED ORDER — ROCURONIUM BROMIDE 10 MG/ML (PF) SYRINGE
PREFILLED_SYRINGE | INTRAVENOUS | Status: AC
  Filled 2019-09-25: qty 10

## 2019-09-25 MED ORDER — SODIUM CHLORIDE 0.9 % IR SOLN
Status: DC | PRN
  Administered 2019-09-25: 3000 mL

## 2019-09-25 MED ORDER — FENTANYL CITRATE (PF) 100 MCG/2ML IJ SOLN
75.0000 ug | Freq: Once | INTRAMUSCULAR | Status: AC
  Administered 2019-09-25: 05:00:00 75 ug via INTRAVENOUS
  Filled 2019-09-25: qty 2

## 2019-09-25 MED ORDER — LIDOCAINE 2% (20 MG/ML) 5 ML SYRINGE
INTRAMUSCULAR | Status: AC
  Filled 2019-09-25: qty 5

## 2019-09-25 MED ORDER — THIAMINE HCL 100 MG PO TABS
100.0000 mg | ORAL_TABLET | Freq: Every day | ORAL | Status: DC
  Administered 2019-09-26: 100 mg via ORAL
  Filled 2019-09-25: qty 1

## 2019-09-25 MED ORDER — IODIXANOL 320 MG/ML IV SOLN
INTRAVENOUS | Status: DC | PRN
  Administered 2019-09-25: 12:00:00 60 mL via INTRA_ARTERIAL

## 2019-09-25 MED ORDER — ALBUTEROL SULFATE HFA 108 (90 BASE) MCG/ACT IN AERS
INHALATION_SPRAY | RESPIRATORY_TRACT | Status: AC
  Filled 2019-09-25: qty 6.7

## 2019-09-25 MED ORDER — DEXAMETHASONE SODIUM PHOSPHATE 10 MG/ML IJ SOLN
INTRAMUSCULAR | Status: DC | PRN
  Administered 2019-09-25: 4 mg via INTRAVENOUS

## 2019-09-25 MED ORDER — HYDRALAZINE HCL 20 MG/ML IJ SOLN: 5.0000 mg | INTRAMUSCULAR | Status: DC | PRN

## 2019-09-25 MED ORDER — PROPOFOL 10 MG/ML IV BOLUS
INTRAVENOUS | Status: AC
  Filled 2019-09-25: qty 20

## 2019-09-25 MED ORDER — ONDANSETRON HCL 4 MG/2ML IJ SOLN
INTRAMUSCULAR | Status: AC
  Filled 2019-09-25: qty 2

## 2019-09-25 MED ORDER — BUPIVACAINE-EPINEPHRINE (PF) 0.5% -1:200000 IJ SOLN
INTRAMUSCULAR | Status: DC | PRN
  Administered 2019-09-25: 30 mL via PERINEURAL

## 2019-09-25 MED ORDER — METOPROLOL TARTRATE 5 MG/5ML IV SOLN: 5.0000 mg | Freq: Four times a day (QID) | INTRAVENOUS | Status: DC | PRN

## 2019-09-25 MED ORDER — LIDOCAINE 2% (20 MG/ML) 5 ML SYRINGE
INTRAMUSCULAR | Status: DC | PRN
  Administered 2019-09-25: 100 mg via INTRAVENOUS

## 2019-09-25 MED ORDER — MIDAZOLAM HCL 2 MG/2ML IJ SOLN
INTRAMUSCULAR | Status: AC
  Filled 2019-09-25: qty 2

## 2019-09-25 MED ORDER — SODIUM CHLORIDE 0.9 % IV SOLN
INTRAVENOUS | Status: DC | PRN
  Administered 2019-09-25: 12:00:00 500 mL

## 2019-09-25 MED ORDER — LACTATED RINGERS IV SOLN: INTRAVENOUS | Status: DC | PRN

## 2019-09-25 MED ORDER — ASPIRIN EC 81 MG PO TBEC
81.0000 mg | DELAYED_RELEASE_TABLET | Freq: Every day | ORAL | Status: DC
  Administered 2019-09-25 – 2019-10-01 (×7): 81 mg via ORAL
  Filled 2019-09-25 (×7): qty 1

## 2019-09-25 MED ORDER — HYDROMORPHONE HCL 1 MG/ML IJ SOLN
1.0000 mg | Freq: Once | INTRAMUSCULAR | Status: AC
  Administered 2019-09-25: 1 mg via INTRAVENOUS
  Filled 2019-09-25: qty 1

## 2019-09-25 MED ORDER — PANTOPRAZOLE SODIUM 40 MG IV SOLR: 40.0000 mg | Freq: Every day | INTRAVENOUS | Status: DC

## 2019-09-25 MED ORDER — CEFAZOLIN SODIUM-DEXTROSE 2-4 GM/100ML-% IV SOLN
2.0000 g | Freq: Three times a day (TID) | INTRAVENOUS | Status: AC
  Administered 2019-09-25 – 2019-09-26 (×4): 2 g via INTRAVENOUS
  Filled 2019-09-25 (×6): qty 100

## 2019-09-25 MED ORDER — FENTANYL CITRATE (PF) 250 MCG/5ML IJ SOLN
INTRAMUSCULAR | Status: AC
  Filled 2019-09-25: qty 5

## 2019-09-25 MED ORDER — LABETALOL HCL 5 MG/ML IV SOLN: 10.0000 mg | INTRAVENOUS | Status: DC | PRN

## 2019-09-25 MED ORDER — IODIXANOL 320 MG/ML IV SOLN
INTRAVENOUS | Status: DC | PRN
  Administered 2019-09-25: 13:00:00 5 mL via INTRA_ARTERIAL

## 2019-09-25 MED ORDER — FENTANYL CITRATE (PF) 100 MCG/2ML IJ SOLN
INTRAMUSCULAR | Status: DC | PRN
  Administered 2019-09-25: 50 ug via INTRAVENOUS
  Administered 2019-09-25: 100 ug via INTRAVENOUS
  Administered 2019-09-25: 50 ug via INTRAVENOUS

## 2019-09-25 MED ORDER — ALBUTEROL SULFATE (2.5 MG/3ML) 0.083% IN NEBU
2.5000 mg | INHALATION_SOLUTION | Freq: Four times a day (QID) | RESPIRATORY_TRACT | Status: DC | PRN
  Administered 2019-09-26 (×3): 2.5 mg via RESPIRATORY_TRACT
  Filled 2019-09-25 (×2): qty 3

## 2019-09-25 MED ORDER — SODIUM CHLORIDE 0.9% FLUSH: 3.0000 mL | INTRAVENOUS | Status: DC | PRN

## 2019-09-25 MED ORDER — ONDANSETRON HCL 4 MG/2ML IJ SOLN: 4.0000 mg | Freq: Four times a day (QID) | INTRAMUSCULAR | Status: DC | PRN

## 2019-09-25 MED ORDER — DOCUSATE SODIUM 100 MG PO CAPS
100.0000 mg | ORAL_CAPSULE | Freq: Two times a day (BID) | ORAL | Status: DC
  Administered 2019-09-25 – 2019-10-01 (×11): 100 mg via ORAL
  Filled 2019-09-25 (×11): qty 1

## 2019-09-25 MED ORDER — FOLIC ACID 1 MG PO TABS
1.0000 mg | ORAL_TABLET | Freq: Every day | ORAL | Status: DC
  Administered 2019-09-26 – 2019-10-01 (×6): 1 mg via ORAL
  Filled 2019-09-25 (×6): qty 1

## 2019-09-25 MED ORDER — PANTOPRAZOLE SODIUM 40 MG PO TBEC
40.0000 mg | DELAYED_RELEASE_TABLET | Freq: Every day | ORAL | Status: DC
  Administered 2019-09-25 – 2019-10-01 (×7): 40 mg via ORAL
  Filled 2019-09-25 (×7): qty 1

## 2019-09-25 MED ORDER — SUCCINYLCHOLINE CHLORIDE 200 MG/10ML IV SOSY
PREFILLED_SYRINGE | INTRAVENOUS | Status: AC
  Filled 2019-09-25: qty 10

## 2019-09-25 MED ORDER — 0.9 % SODIUM CHLORIDE (POUR BTL) OPTIME
TOPICAL | Status: DC | PRN
  Administered 2019-09-25: 1000 mL

## 2019-09-25 MED ORDER — SODIUM CHLORIDE 0.9 % IV SOLN: INTRAVENOUS | Status: DC

## 2019-09-25 MED ORDER — LIDOCAINE-EPINEPHRINE (PF) 2 %-1:200000 IJ SOLN
10.0000 mL | Freq: Once | INTRAMUSCULAR | Status: AC
  Administered 2019-09-25: 10 mL via INTRADERMAL
  Filled 2019-09-25: qty 20

## 2019-09-25 MED ORDER — PHENYLEPHRINE 40 MCG/ML (10ML) SYRINGE FOR IV PUSH (FOR BLOOD PRESSURE SUPPORT)
PREFILLED_SYRINGE | INTRAVENOUS | Status: DC | PRN
  Administered 2019-09-25 (×3): 80 ug via INTRAVENOUS

## 2019-09-25 MED ORDER — OXYCODONE HCL 5 MG PO TABS
5.0000 mg | ORAL_TABLET | ORAL | Status: DC | PRN
  Administered 2019-09-26 (×3): 5 mg via ORAL
  Filled 2019-09-25 (×3): qty 1

## 2019-09-25 MED ORDER — MORPHINE SULFATE (PF) 2 MG/ML IV SOLN
1.0000 mg | INTRAVENOUS | Status: DC | PRN
  Administered 2019-09-25 – 2019-09-26 (×3): 2 mg via INTRAVENOUS
  Filled 2019-09-25 (×4): qty 1

## 2019-09-25 MED ORDER — KCL IN DEXTROSE-NACL 20-5-0.45 MEQ/L-%-% IV SOLN: INTRAVENOUS | Status: DC

## 2019-09-25 MED ORDER — ONDANSETRON 4 MG PO TBDP: 4.0000 mg | ORAL_TABLET | Freq: Four times a day (QID) | ORAL | Status: DC | PRN

## 2019-09-25 MED ORDER — CHLORHEXIDINE GLUCONATE CLOTH 2 % EX PADS
6.0000 | MEDICATED_PAD | Freq: Every day | CUTANEOUS | Status: DC
  Administered 2019-09-25: 6 via TOPICAL

## 2019-09-25 MED ORDER — ALBUTEROL SULFATE (2.5 MG/3ML) 0.083% IN NEBU
2.5000 mg | INHALATION_SOLUTION | Freq: Once | RESPIRATORY_TRACT | Status: AC
  Administered 2019-09-25: 2.5 mg via RESPIRATORY_TRACT

## 2019-09-25 MED ORDER — PROPOFOL 10 MG/ML IV BOLUS
INTRAVENOUS | Status: DC | PRN
  Administered 2019-09-25: 170 mg via INTRAVENOUS
  Administered 2019-09-25: 30 mg via INTRAVENOUS

## 2019-09-25 MED ORDER — MIDAZOLAM HCL 5 MG/5ML IJ SOLN
INTRAMUSCULAR | Status: DC | PRN
  Administered 2019-09-25: 2 mg via INTRAVENOUS

## 2019-09-25 MED ORDER — ROCURONIUM BROMIDE 10 MG/ML (PF) SYRINGE
PREFILLED_SYRINGE | INTRAVENOUS | Status: DC | PRN
  Administered 2019-09-25: 40 mg via INTRAVENOUS
  Administered 2019-09-25 (×4): 10 mg via INTRAVENOUS
  Administered 2019-09-25: 20 mg via INTRAVENOUS

## 2019-09-25 MED ORDER — SODIUM CHLORIDE 0.9 % IV SOLN: 250.0000 mL | INTRAVENOUS | Status: DC | PRN

## 2019-09-25 MED ORDER — SODIUM CHLORIDE 0.9% FLUSH
3.0000 mL | Freq: Two times a day (BID) | INTRAVENOUS | Status: DC
  Administered 2019-09-25 – 2019-10-01 (×7): 3 mL via INTRAVENOUS

## 2019-09-25 SURGICAL SUPPLY — 116 items
APL SKNCLS STERI-STRIP NONHPOA (GAUZE/BANDAGES/DRESSINGS)
BAG BANDED W/RUBBER/TAPE 36X54 (MISCELLANEOUS) IMPLANT
BENZOIN TINCTURE PRP APPL 2/3 (GAUZE/BANDAGES/DRESSINGS) IMPLANT
BIT DRILL 2.0 LNG QUCK RELEASE (BIT) ×2 IMPLANT
BIT DRILL 2.8 QUICK RELEASE (BIT) ×2 IMPLANT
BLADE CLIPPER SURG (BLADE) ×3 IMPLANT
BLADE SURG 10 STRL SS (BLADE) IMPLANT
BNDG COHESIVE 4X5 TAN STRL (GAUZE/BANDAGES/DRESSINGS) ×3 IMPLANT
BNDG ELASTIC 3X5.8 VLCR STR LF (GAUZE/BANDAGES/DRESSINGS) ×6 IMPLANT
BNDG ELASTIC 4X5.8 VLCR STR LF (GAUZE/BANDAGES/DRESSINGS) ×6 IMPLANT
BNDG ESMARK 4X9 LF (GAUZE/BANDAGES/DRESSINGS) ×3 IMPLANT
BNDG GAUZE ELAST 4 BULKY (GAUZE/BANDAGES/DRESSINGS) ×3 IMPLANT
BUCKET CAST 5QT PAPER WAX WHT (CAST SUPPLIES) ×3 IMPLANT
CANISTER SUCT 3000ML PPV (MISCELLANEOUS) ×3 IMPLANT
CATH ANGIO 5F BER2 65CM (CATHETERS) IMPLANT
CHLORAPREP W/TINT 26 (MISCELLANEOUS) ×3 IMPLANT
COVER DOME SNAP 22 D (MISCELLANEOUS) IMPLANT
COVER PROBE W GEL 5X96 (DRAPES) ×3 IMPLANT
COVER SURGICAL LIGHT HANDLE (MISCELLANEOUS) ×6 IMPLANT
COVER WAND RF STERILE (DRAPES) IMPLANT
CUFF TOURN SGL QUICK 18X4 (TOURNIQUET CUFF) ×3 IMPLANT
CUFF TOURN SGL QUICK 24 (TOURNIQUET CUFF) ×1
CUFF TRNQT CYL 24X4X16.5-23 (TOURNIQUET CUFF) ×2 IMPLANT
DERMABOND ADVANCED (GAUZE/BANDAGES/DRESSINGS) ×1
DERMABOND ADVANCED .7 DNX12 (GAUZE/BANDAGES/DRESSINGS) ×2 IMPLANT
DEVICE CLOSURE PERCLS PRGLD 6F (VASCULAR PRODUCTS) ×2 IMPLANT
DEVICE TORQUE KENDALL .025-038 (MISCELLANEOUS) ×3 IMPLANT
DRAPE C-ARM 42X72 X-RAY (DRAPES) ×6 IMPLANT
DRAPE FEMORAL ANGIO 80X135IN (DRAPES) IMPLANT
DRAPE IMP U-DRAPE 54X76 (DRAPES) ×9 IMPLANT
DRAPE INCISE IOBAN 66X45 STRL (DRAPES) ×3 IMPLANT
DRAPE ORTHO SPLIT 77X108 STRL (DRAPES) ×3
DRAPE SURG 17X23 STRL (DRAPES) ×3 IMPLANT
DRAPE SURG ORHT 6 SPLT 77X108 (DRAPES) ×6 IMPLANT
DRAPE U-SHAPE 47X51 STRL (DRAPES) ×3 IMPLANT
DRILL 2.0 LNG QUICK RELEASE (BIT) ×3
DRILL 2.8 QUICK RELEASE (BIT) ×3
DRSG PAD ABDOMINAL 8X10 ST (GAUZE/BANDAGES/DRESSINGS) ×6 IMPLANT
ELECT REM PT RETURN 9FT ADLT (ELECTROSURGICAL) ×6
ELECTRODE REM PT RTRN 9FT ADLT (ELECTROSURGICAL) ×4 IMPLANT
GAUZE 4X4 16PLY RFD (DISPOSABLE) IMPLANT
GAUZE SPONGE 4X4 12PLY STRL (GAUZE/BANDAGES/DRESSINGS) ×3 IMPLANT
GAUZE SPONGE 4X4 16PLY XRAY LF (GAUZE/BANDAGES/DRESSINGS) ×6 IMPLANT
GAUZE XEROFORM 5X9 LF (GAUZE/BANDAGES/DRESSINGS) ×6 IMPLANT
GLOVE BIO SURGEON STRL SZ7.5 (GLOVE) ×3 IMPLANT
GLOVE BIOGEL PI IND STRL 7.5 (GLOVE) ×4 IMPLANT
GLOVE BIOGEL PI IND STRL 8 (GLOVE) ×4 IMPLANT
GLOVE BIOGEL PI INDICATOR 7.5 (GLOVE) ×2
GLOVE BIOGEL PI INDICATOR 8 (GLOVE) ×2
GLOVE SURG SS PI 7.5 STRL IVOR (GLOVE) ×6 IMPLANT
GLOVE SURG SYN 7.5  E (GLOVE) ×2
GLOVE SURG SYN 7.5 E (GLOVE) ×4 IMPLANT
GOWN STRL REUS W/ TWL LRG LVL3 (GOWN DISPOSABLE) ×16 IMPLANT
GOWN STRL REUS W/TWL LRG LVL3 (GOWN DISPOSABLE) ×16
GUIDEWIRE ANGLED .035X150CM (WIRE) ×3 IMPLANT
GUIDEWIRE ORTH 6X062XTROC NS (WIRE) ×16 IMPLANT
GUIDEWIRE ORTHO MINI ACTK .045 (WIRE) ×12 IMPLANT
K-WIRE .062 (WIRE) ×16
KIT BASIN OR (CUSTOM PROCEDURE TRAY) ×6 IMPLANT
KIT TURNOVER KIT B (KITS) ×6 IMPLANT
MANIFOLD NEPTUNE II (INSTRUMENTS) ×3 IMPLANT
NEEDLE PERC 18GX7CM (NEEDLE) IMPLANT
NS IRRIG 1000ML POUR BTL (IV SOLUTION) ×9 IMPLANT
PACK ENDO MINOR (CUSTOM PROCEDURE TRAY) ×3 IMPLANT
PACK ORTHO EXTREMITY (CUSTOM PROCEDURE TRAY) ×6 IMPLANT
PACK SURGICAL SETUP 50X90 (CUSTOM PROCEDURE TRAY) IMPLANT
PAD ARMBOARD 7.5X6 YLW CONV (MISCELLANEOUS) ×6 IMPLANT
PAD CAST 4YDX4 CTTN HI CHSV (CAST SUPPLIES) ×2 IMPLANT
PADDING CAST COTTON 4X4 STRL (CAST SUPPLIES) ×1
PADDING CAST SYNTHETIC 3 NS LF (CAST SUPPLIES) ×1
PADDING CAST SYNTHETIC 3X4 NS (CAST SUPPLIES) ×2 IMPLANT
PERCLOSE PROGLIDE 6F (VASCULAR PRODUCTS) ×3
PLATE OLECRANON RT .3HOLE (Plate) ×3 IMPLANT
PROTECTION STATION PRESSURIZED (MISCELLANEOUS)
PULSAVAC PLUS IRRIG FAN TIP (DISPOSABLE) ×3
SCREW BONEVA 2.7X20 HEXA (Screw) ×3 IMPLANT
SCREW CORTICAL 3.5X20MM (Screw) ×3 IMPLANT
SCREW HEX LOCK 2.7X14MM (Screw) ×3 IMPLANT
SCREW HEX LOCK 3.5X20MM (Screw) ×3 IMPLANT
SCREW HEX VA 2.7X24 (Screw) ×3 IMPLANT
SCREW HEXALOBE LOCKING 3.5X16M (Screw) ×3 IMPLANT
SCREW HEXALOBE VA 3.5X16 (Screw) ×3 IMPLANT
SCREW NON LOCKING HEX 3.5X22 (Screw) ×3 IMPLANT
SCREW NONLOCKING 3.5X28MM (Screw) ×3 IMPLANT
SCREW VA HEX 2.7X18 (Screw) ×6 IMPLANT
SCREW VA LOCKING 2.7X14 (Screw) ×3 IMPLANT
SEALER BIPOLAR AQUA 6.0 (INSTRUMENTS) ×3 IMPLANT
SET CYSTO W/LG BORE CLAMP LF (SET/KITS/TRAYS/PACK) ×3 IMPLANT
SET MICROPUNCTURE 5F STIFF (MISCELLANEOUS) IMPLANT
SHEATH AVANTI 11CM 5FR (SHEATH) ×3 IMPLANT
SPLINT PLASTER CAST XFAST 3X15 (CAST SUPPLIES) ×2 IMPLANT
SPLINT PLASTER XTRA FASTSET 3X (CAST SUPPLIES) ×1
SPONGE LAP 18X18 RF (DISPOSABLE) IMPLANT
STATION PROTECTION PRESSURIZED (MISCELLANEOUS) IMPLANT
STOPCOCK MORSE 400PSI 3WAY (MISCELLANEOUS) ×3 IMPLANT
STRIP CLOSURE SKIN 1/2X4 (GAUZE/BANDAGES/DRESSINGS) IMPLANT
SUCTION FRAZIER HANDLE 10FR (MISCELLANEOUS) ×1
SUCTION TUBE FRAZIER 10FR DISP (MISCELLANEOUS) ×2 IMPLANT
SUT MNCRL AB 4-0 PS2 18 (SUTURE) ×3 IMPLANT
SUT MON AB 2-0 CT1 36 (SUTURE) ×3 IMPLANT
SUT VIC AB 0 CT1 27 (SUTURE) ×6
SUT VIC AB 0 CT1 27XBRD ANBCTR (SUTURE) ×6 IMPLANT
SUT VIC AB 2-0 FS1 27 (SUTURE) ×6 IMPLANT
SYR 10ML LL (SYRINGE) IMPLANT
SYR 20ML LL LF (SYRINGE) IMPLANT
SYR MEDRAD MARK V 150ML (SYRINGE) ×3 IMPLANT
TIP FAN IRRIG PULSAVAC PLUS (DISPOSABLE) ×2 IMPLANT
TOWEL GREEN STERILE (TOWEL DISPOSABLE) ×9 IMPLANT
TOWEL GREEN STERILE FF (TOWEL DISPOSABLE) ×3 IMPLANT
TUBE CONNECTING 12X1/4 (SUCTIONS) ×6 IMPLANT
TUBING HIGH PRESSURE 120CM (CONNECTOR) ×3 IMPLANT
TUBING INJECTOR 48 (MISCELLANEOUS) IMPLANT
WATER STERILE IRR 1000ML POUR (IV SOLUTION) ×3 IMPLANT
WIRE AMPLATZ SS-J .035X180CM (WIRE) ×3 IMPLANT
WIRE BENTSON .035X145CM (WIRE) ×6 IMPLANT
YANKAUER SUCT BULB TIP NO VENT (SUCTIONS) ×6 IMPLANT

## 2019-09-25 NOTE — Progress Notes (Signed)
Just prior to leaving Short Stay patient questioned about the location of a ring she states she had. Prior to arrival to Short Stay all belongings were removed in ED. Inventory of belongings not completed in Short Stay. Belongings bag went to OR with OR RN.

## 2019-09-25 NOTE — OR Nursing (Signed)
Patient belongings and cup with patient silver colored ring with 3 clear stones taken to PACU by Silvestre Gunner RN and given to Caryl Comes RN.

## 2019-09-25 NOTE — Anesthesia Preprocedure Evaluation (Signed)
Anesthesia Evaluation  Patient identified by MRN, date of birth, ID band Patient awake    Reviewed: Allergy & Precautions, NPO status , Patient's Chart, lab work & pertinent test results  Airway Mallampati: II  TM Distance: >3 FB Neck ROM: Full    Dental   Pulmonary    Pulmonary exam normal        Cardiovascular Normal cardiovascular exam     Neuro/Psych    GI/Hepatic   Endo/Other    Renal/GU      Musculoskeletal   Abdominal   Peds  Hematology   Anesthesia Other Findings   Reproductive/Obstetrics                             Anesthesia Physical Anesthesia Plan  ASA: III and emergent  Anesthesia Plan: General   Post-op Pain Management:    Induction: Intravenous  PONV Risk Score and Plan: 3 and Ondansetron and Treatment may vary due to age or medical condition  Airway Management Planned: Oral ETT  Additional Equipment:   Intra-op Plan:   Post-operative Plan: Possible Post-op intubation/ventilation  Informed Consent: I have reviewed the patients History and Physical, chart, labs and discussed the procedure including the risks, benefits and alternatives for the proposed anesthesia with the patient or authorized representative who has indicated his/her understanding and acceptance.       Plan Discussed with: CRNA and Surgeon  Anesthesia Plan Comments:         Anesthesia Quick Evaluation

## 2019-09-25 NOTE — Transfer of Care (Signed)
Immediate Anesthesia Transfer of Care Note  Patient: [name]  Procedure(s) Performed: Aortagram with renal angiogram nonselective. (N/A ) Open Reduction Internal Fixation (Orif) Elbow/Olecranon Fracture irrigation and debridement, Right (Right Elbow)  Patient Location: PACU  Anesthesia Type:GA combined with regional for post-op pain  Level of Consciousness: drowsy and patient cooperative  Airway & Oxygen Therapy: Patient Spontanous Breathing and Patient connected to nasal cannula oxygen  Post-op Assessment: Report given to RN, Post -op Vital signs reviewed and unstable, Anesthesiologist notified and Patient moving all extremities X 4  Post vital signs: Reviewed and unstable  Last Vitals:  Vitals Value Taken Time  BP 157/87 09/25/19 1753  Temp 37.2 C 09/25/19 1700  Pulse 120 09/25/19 1756  Resp 18 09/25/19 1756  SpO2 85 % 09/25/19 1756  Vitals shown include unvalidated device data.  Last Pain:  Vitals:   09/25/19 0039  PainSc: 10-Worst pain ever         Complications: No apparent anesthesia complications
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
## 2019-09-25 NOTE — Consult Note (Signed)
Referring Physician: Trauma service  Patient name: [name] MRN: [id number] DOB: Aug 04, 1971 Sex: female  REASON FOR CONSULT: Possible right renal artery injury  HPI: [name] is a 49 y.o. female, who fell from the second story of a hotel earlier today.  She landed primarily on her right side.  I was called to see her due to possible right renal artery injury.  The patient does have some right flank pain on palpation.  She also has some mild right abdominal pain.  She has an open fracture in her right arm.  She was alert and oriented to person but not really to place and could not really tell for sure if she was at [facility] or in [place].    History reviewed. No pertinent past medical history. History reviewed. No pertinent surgical history.  No family history on file.  SOCIAL HISTORY: Social History   Socioeconomic History  . Marital status: Single    Spouse name: Not on file  . Number of children: Not on file  . Years of education: Not on file  . Highest education level: Not on file  Occupational History  . Not on file  Tobacco Use  . Smoking status: Not on file  Substance and Sexual Activity  . Alcohol use: Not on file  . Drug use: Not on file  . Sexual activity: Not on file  Other Topics Concern  . Not on file  Social History Narrative  . Not on file   Social Determinants of Health   Financial Resource Strain:   . Difficulty of Paying Living Expenses: Not on file  Food Insecurity:   . Worried About Charity fundraiser in the Last Year: Not on file  . Ran Out of Food in the Last Year: Not on file  Transportation Needs:   . Lack of Transportation (Medical): Not on file  . Lack of Transportation (Non-Medical): Not on file  Physical Activity:   . Days of Exercise per Week: Not on file  . Minutes of Exercise per Session: Not on file  Stress:   . Feeling of Stress : Not on file  Social Connections:   . Frequency of Communication with Friends  and Family: Not on file  . Frequency of Social Gatherings with Friends and Family: Not on file  . Attends Religious Services: Not on file  . Active Member of Clubs or Organizations: Not on file  . Attends Archivist Meetings: Not on file  . Marital Status: Not on file  Intimate Partner Violence:   . Fear of Current or Ex-Partner: Not on file  . Emotionally Abused: Not on file  . Physically Abused: Not on file  . Sexually Abused: Not on file    No Known Allergies  Current Facility-Administered Medications  Medication Dose Route Frequency Provider Last Rate Last Admin  . 0.9 %  sodium chloride infusion   Intravenous Continuous Cardama, Grayce Sessions, MD 125 mL/hr at 09/25/19 1049 New Bag at 09/25/19 1049  . [MAR Hold] ceFAZolin (ANCEF) IVPB 2g/100 mL premix  2 g Intravenous Q8H Meuth, Brooke A, PA-C      . [MAR Hold] folic acid (FOLVITE) tablet 1 mg  1 mg Oral Daily Meuth, Brooke A, PA-C      . [MAR Hold] thiamine tablet 100 mg  100 mg Oral Daily Meuth, Brooke A, PA-C        ROS:   Physical Examination  Vitals:   09/25/19 0715  09/25/19 0830 09/25/19 0900 09/25/19 0945  BP: 127/69 (!) 145/80 (!) 148/71 (!) 141/91  Pulse: (!) 102 100 (!) 103 99  Resp: 16 16 16  (!) 21  Temp:      SpO2: 90% 91% 92% 94%  Weight:      Height:        Body mass index is 31.25 kg/m.  General:  Alert and oriented, no acute distress HEENT: Normal Neck: No bruit or JVD Pulmonary: Clear to auscultation bilaterally Cardiac: Regular Rate and Rhythm without murmur Abdomen: Soft, non-tender, non-distended, no mass, no scars Skin: No rash Extremity Pulses:  2+ radial, brachial, femoral, dorsalis pedis, posterior tibial pulses bilaterally Musculoskeletal: No deformity or edema  Neurologic: Upper and lower extremity motor 5/5 and symmetric  DATA:  CBC    Component Value Date/Time   WBC 13.7 (H) 09/25/2019 0042   RBC 4.48 09/25/2019 0042   HGB 15.3 (H) 09/25/2019 0053   HCT 45.0  09/25/2019 0053   PLT 359 09/25/2019 0042   MCV 97.3 09/25/2019 0042   MCH 32.8 09/25/2019 0042   MCHC 33.7 09/25/2019 0042   RDW 12.5 09/25/2019 0042    BMET    Component Value Date/Time   NA 134 (L) 09/25/2019 0053   K 3.2 (L) 09/25/2019 0053   CL 100 09/25/2019 0053   CO2 18 (L) 09/25/2019 0042   GLUCOSE 131 (H) 09/25/2019 0053   BUN 6 09/25/2019 0053   CREATININE 1.00 09/25/2019 0053   CALCIUM 8.9 09/25/2019 0042   GFRNONAA >60 09/25/2019 0042   GFRAA >60 09/25/2019 0042   Urinalysis moderate blood  CT scan abdomen pelvis images reviewed.  Although there is comment that there may be some narrowing of the proximal right renal artery this is fairly subtle and difficult to appreciate.  There are also evidence of renal infarct in the lower pole of the right kidney.  There was also delay of contrast excretion on the right.  Patient also has a liver laceration.  ASSESSMENT: Possible right renal artery dissection secondary to blunt trauma   PLAN: Patient was slightly disoriented no family was available for consent so we are proceeding with emergency consent for the procedure.  Risk benefits possible complications of procedure details including but not limited to bleeding infection respiratory contrast reaction were discussed with patient.  She was able to verbalize that she understood the procedure that was about to occur.   11/23/2019, MD [facility] Office: [phone] Pager: [phone]

## 2019-09-25 NOTE — OR Nursing (Signed)
Patient questioned location of belongings prior to leaving short stay.   Belongings inventoried and 1 pair of pink fleece pants, 1 green bra, 1 pair black/grey underwear and 1 pair of [employer] red and black shoes in patient belonging bag. In addition, 1 silver colored ring with three clear stones located in specimen cup in shoe.  Cup with ring labeled with patient ID.   Will hand off to PACU RN on arrival.

## 2019-09-25 NOTE — Anesthesia Postprocedure Evaluation (Signed)
Anesthesia Post Note  Patient: [name]  Procedure(s) Performed: Aortagram with renal angiogram nonselective. (N/A ) Open Reduction Internal Fixation (Orif) Elbow/Olecranon Fracture irrigation and debridement, Right (Right Elbow)     Patient location during evaluation: PACU Anesthesia Type: General Level of consciousness: awake and alert Pain management: pain level controlled Vital Signs Assessment: post-procedure vital signs reviewed and stable Respiratory status: spontaneous breathing, nonlabored ventilation, respiratory function stable and patient connected to nasal cannula oxygen Cardiovascular status: blood pressure returned to baseline and stable Postop Assessment: no apparent nausea or vomiting Anesthetic complications: no    Last Vitals:  Vitals:   09/25/19 1900 09/25/19 2000  BP: (!) 164/91 (!) 156/90  Pulse: (!) 126 99  Resp: (!) 29 15  Temp:  36.8 C  SpO2: 95% 99%    Last Pain:  Vitals:   09/25/19 2000  TempSrc: Oral  PainSc:                  Hallee Mckenny DAVID
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
## 2019-09-25 NOTE — ED Notes (Signed)
Patient repositioned on stretcher new linens applied. Ortho MD at bedside.Patient alert oriented c/o pain in right elbow.

## 2019-09-25 NOTE — ED Provider Notes (Addendum)
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 reports that the patient has polysubstance use disorder and alcohol use disorder.  She reports that she spoke with her earlier in the evening and reports that her daughter told her that she was going to kill her self.  Her daughter also sent her a text saying "bye." Will IVC patient. She will require psych eval once medically cleared.     Final Clinical Impression(s) / ED Diagnoses Final diagnoses:  Fall  Laceration of liver, initial encounter  Injury of right kidney, initial encounter  Closed fracture of olecranon process of right ulna, initial encounter  Laceration of forehead, initial encounter      This chart was dictated using voice recognition software.  Despite best efforts to proofread,  errors can occur which can change the documentation meaning.     Nira Connardama, Latresa Gasser Eduardo, MD 09/25/19 [phone]

## 2019-09-25 NOTE — Anesthesia Procedure Notes (Signed)
Anesthesia Regional Block: Supraclavicular block   Pre-Anesthetic Checklist: ,, timeout performed, Correct Patient, Correct Site, Correct Laterality, Correct Procedure, Correct Position, risks and benefits discussed, surgical consent, pre-op evaluation,  At surgeon's request and post-op pain management  Laterality: Right  Prep: chloraprep       Needles:   Needle Type: Echogenic Stimulator Needle     Needle Length: 9cm  Needle Gauge: 21     Additional Needles:   Procedures:, nerve stimulator,,,,,,,   Nerve Stimulator or Paresthesia:  Response: 0.4 mA,   Additional Responses:   Narrative:  Start time: 09/25/2019 5:40 PM End time: 09/25/2019 5:50 PM Injection made incrementally with aspirations every 5 mL.  Performed by: Personally  Anesthesiologist: Arta Bruce, MD  Additional Notes: Monitors applied. Patient sedated. Sterile prep and drape,hand hygiene and sterile gloves were used. Relevant anatomy identified.Needle position confirmed.Local anesthetic injected incrementally after negative aspiration. Local anesthetic spread visualized around nerve(s). Vascular puncture avoided. No complications. Image printed for medical record.The patient tolerated the procedure well.

## 2019-09-25 NOTE — Progress Notes (Signed)
Prior to signing consents patient able to tell me what procedures she was having done.

## 2019-09-25 NOTE — OR Nursing (Signed)
Sheath removed by Dr. Darrick Penna from Right Femoral; pressure held for 10 minutes, dermabond then applied.  Right groin unremarkable; soft without hematoma.

## 2019-09-25 NOTE — Progress Notes (Signed)
Additional PACU Note: Pt arrived in PACU after GA and R Interscalene block post elbow ORIF with excellent pain relief.  Initially her saturation was in the 80% range with good lung excursions. Her breath sounds were decreased on the R side and I ordered a CXR which showed no pneumothorax but atelectasis on the right with raised R hemidiaphragm. With her Saturations dropping into the 70's, I placed her on a nonrebreathing mask and encouraged deep breathing. I gave her a breathing treatment and an ABG showed Ph:7.30 Paco2 46 Pao2 42. After the breathing treatment and non rebreathing mask her saturations improved to high 80's. She was completely lucid through all of this and subjectively felt she was breathing OK.   Once her saturations improved consistantly into the low 90s and I had discussed her situation with Dr Harlon Flor, we transferred her to the ICU. The working diagnosis was a mucus plug with some contribution of the Supraclavicular block to raising her R hemidiaphragm.  Will follow.  Arta Bruce MD
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
## 2019-09-25 NOTE — Progress Notes (Signed)
Orthopedic Tech Progress Note Patient Details:  [name] 1970-10-01 [id number]  Ortho Devices Type of Ortho Device: Post (long arm) splint Ortho Device/Splint Location: rue. Ortho Device/Splint Interventions: Ordered, Application The patient did not handle having her arm moved to apply the splint to well. She seemed comfortable with her arm just lying on her stomach so I didn't apply the sling since she was being admitted to the hospital.  Post Interventions Patient Tolerated: Poor   Trinna Post 09/25/2019, 4:16 AM

## 2019-09-25 NOTE — Consult Note (Addendum)
ORTHOPAEDIC CONSULTATION  REQUESTING PHYSICIAN: Md, Trauma, MD  PCP:  Patient, No Pcp Per  Chief Complaint: Level 2 trauma  HPI: [name] is a 49 y.o. female who complains of a level 2 trauma following a fall from 20 feet.  She presented last night to the ER after EMS reported she jumped approximately 20 feet.  She was seen in the ER where she was found to have multiple injuries including liver laceration and kidney injury.  Additionally she was found to have a comminuted right olecranon fracture.  Hand surgery was consulted for further recommendations and treatment options.  On exam she complains of pain throughout her body.  She denies numbness or tingling into the hand or arm.  A splint was applied prior to my arrival.  She had no pain in the elbow prior to the injury.  History reviewed. No pertinent past medical history. History reviewed. No pertinent surgical history. Social History   Socioeconomic History  . Marital status: Single    Spouse name: Not on file  . Number of children: Not on file  . Years of education: Not on file  . Highest education level: Not on file  Occupational History  . Not on file  Tobacco Use  . Smoking status: Not on file  Substance and Sexual Activity  . Alcohol use: Not on file  . Drug use: Not on file  . Sexual activity: Not on file  Other Topics Concern  . Not on file  Social History Narrative  . Not on file   Social Determinants of Health   Financial Resource Strain:   . Difficulty of Paying Living Expenses: Not on file  Food Insecurity:   . Worried About Programme researcher, broadcasting/film/video in the Last Year: Not on file  . Ran Out of Food in the Last Year: Not on file  Transportation Needs:   . Lack of Transportation (Medical): Not on file  . Lack of Transportation (Non-Medical): Not on file  Physical Activity:   . Days of Exercise per Week: Not on file  . Minutes of Exercise per Session: Not on file  Stress:   . Feeling of Stress :  Not on file  Social Connections:   . Frequency of Communication with Friends and Family: Not on file  . Frequency of Social Gatherings with Friends and Family: Not on file  . Attends Religious Services: Not on file  . Active Member of Clubs or Organizations: Not on file  . Attends Banker Meetings: Not on file  . Marital Status: Not on file   No family history on file. No Known Allergies Prior to Admission medications   Medication Sig Start Date End Date Taking? Authorizing Provider  acetaminophen (TYLENOL) 500 MG tablet Take 1,000 mg by mouth every 6 (six) hours as needed for mild pain.   Yes [provider]  albuterol (VENTOLIN HFA) 108 (90 Base) MCG/ACT inhaler Inhale 2 puffs into the lungs every 6 (six) hours as needed for wheezing or shortness of breath.   Yes [provider]  aspirin EC 81 MG tablet Take 81 mg by mouth daily.   Yes [provider]  lisinopril (ZESTRIL) 20 MG tablet Take 20 mg by mouth daily.   Yes [provider]  omeprazole (PRILOSEC OTC) 20 MG tablet Take 20 mg by mouth daily.   Yes [provider]   DG ELBOW COMPLETE RIGHT (3+VIEW)  Result Date: 09/25/2019 CLINICAL DATA:  Elbow pain  fall EXAM: RIGHT ELBOW - COMPLETE 3+ VIEW COMPARISON:  None. FINDINGS: There is a comminuted mildly displaced olecranon process fracture. Overlying soft tissue swelling is seen. No other fractures identified. IMPRESSION: Comminuted mildly displaced olecranon process fracture. Electronically Signed   By: Prudencio Pair M.D.   On: 09/25/2019 01:33   DG Forearm Right  Result Date: 09/25/2019 CLINICAL DATA:  Upper extremity deformity after fall EXAM: RIGHT FOREARM - 2 VIEW COMPARISON:  None. FINDINGS: Comminuted mildly displaced olecranon process fracture seen. Overlying soft tissue swelling is seen. No other fractures identified. IMPRESSION: Comminuted mildly displaced olecranon process fracture. Electronically Signed   By: Prudencio Pair  M.D.   On: 09/25/2019 01:34   CT HEAD WO CONTRAST  Result Date: 09/25/2019 CLINICAL DATA:  [place] from 2 story building. Laceration to the top of the head. EXAM: CT HEAD WITHOUT CONTRAST CT CERVICAL SPINE WITHOUT CONTRAST TECHNIQUE: Multidetector CT imaging of the head and cervical spine was performed following the standard protocol without intravenous contrast. Multiplanar CT image reconstructions of the cervical spine were also generated. COMPARISON:  04/03/2016 FINDINGS: CT HEAD FINDINGS Brain: The brain shows a normal appearance without evidence of malformation, atrophy, old or acute small or large vessel infarction, mass lesion, hemorrhage, hydrocephalus or extra-axial collection. Vascular: No hyperdense vessel. No evidence of atherosclerotic calcification. Skull: Normal.  No traumatic finding.  No focal bone lesion. Sinuses/Orbits: Mucosal thickening of the left division of the sphenoid sinus. Other sinuses clear. Orbits negative. Other: Frontal scalp swelling. CT CERVICAL SPINE FINDINGS Alignment: Normal Skull base and vertebrae: Normal Soft tissues and spinal canal: Normal Disc levels: Normal except for ordinary facet osteoarthritis on the left at C3-4. No bony canal or foraminal stenosis. Upper chest: Negative Other: None IMPRESSION: Head CT: No intracranial abnormality. No skull fracture. Frontal scalp hematoma. Cervical spine CT: No acute or traumatic finding. Left-sided facet osteoarthritis C3-4. Electronically Signed   By: Nelson Chimes M.D.   On: 09/25/2019 03:04   CT CHEST W CONTRAST  Result Date: 09/25/2019 CLINICAL DATA:  Acute pain due to trauma. Fall from second story building with laceration to the top of the head as well as right arm pain and back pain. EXAM: CT CHEST, ABDOMEN, AND PELVIS WITH CONTRAST TECHNIQUE: Multidetector CT imaging of the chest, abdomen and pelvis was performed following the standard protocol during bolus administration of intravenous contrast. CONTRAST:  143mL  OMNIPAQUE IOHEXOL 300 MG/ML  SOLN COMPARISON:  CT of the pelvis dated July 11, 2007. CT chest dated Jan 06, 2016. FINDINGS: CT CHEST FINDINGS Cardiovascular: There is no evidence for thoracic aortic dissection or aneurysm. No large centrally located pulmonary embolism. The heart size is normal without evidence for a significant pericardial effusion. Mediastinum/Nodes: --No mediastinal or hilar lymphadenopathy. --No axillary lymphadenopathy. --No supraclavicular lymphadenopathy. --the thyroid gland is heterogeneous without definite evidence for thyroid nodule. --The esophagus is unremarkable Lungs/Pleura: There is no pneumothorax. No pleural effusion. No focal infiltrate. Musculoskeletal: There are multiple old healed left-sided rib fractures. There is an old healed fracture of the left scapula and distal left clavicle. CT ABDOMEN PELVIS FINDINGS Hepatobiliary: There is a hepatic laceration measuring approximately 4.1 cm. There is no significant subcapsular hematoma. There may be an additional laceration involving hepatic segment 1 and hepatic segment 7 (axial series 3, image 56) measuring approximately 2 cm each. Status post cholecystectomy.There is no biliary ductal dilation. Pancreas: Normal contours without ductal dilatation. No peripancreatic fluid collection. Spleen: No splenic laceration or hematoma. Adrenals/Urinary Tract: --Adrenal glands: There is  mild haziness about the right adrenal gland and IVC at this level. --Right kidney/ureter: There are multiple wedge-shaped defects involving primarily the lower pole the right kidney. --Left kidney/ureter: No hydronephrosis or perinephric hematoma. --Urinary bladder: Unremarkable. Stomach/Bowel: --Stomach/Duodenum: The stomach is distended with an air-fluid level. --Small bowel: No dilatation or inflammation. --Colon: No focal abnormality. --Appendix: Normal. Vascular/Lymphatic: There is fat stranding posterior to the IVC on the right (axial series 3, image  61). There is no evidence for active arterial extravasation. The aorta at this level is unremarkable aside from a few atherosclerotic calcifications. The IVC is grossly patent. There is some narrowing of the proximal right renal artery which is best appreciated on the sagittal images (sagittal series 6, image 110). --No retroperitoneal lymphadenopathy. --No mesenteric lymphadenopathy. --No pelvic or inguinal lymphadenopathy. Reproductive: Status post hysterectomy. No adnexal mass. Other: No ascites or free air. The abdominal wall is normal. Musculoskeletal. There is an old healed fracture of the right inferior pubic ramus. There is no acute pelvic fracture on today's exam. IMPRESSION: 1. No acute abnormality involving the thorax. Multiple old healed left-sided rib fractures are noted. 2. Fat stranding posterior to the IVC and adjacent to the right adrenal gland. Differential considerations include a right adrenal injury or vascular injury. The IVC and aorta at this level are unremarkable. There is some narrowing of the proximal right renal artery that is best appreciated on the sagittal images. In combination with multiple wedge-shaped defects in the lower pole the right kidney, findings are felt to be related to a right renal artery injury or traumatic dissection. A distinct dissection flap is not well appreciated on this exam. A right renal artery injury is further supported by delayed renal enhancement on the delayed phase. Additionally, there is no obvious excretion of contrast from the right kidney on the delayed phase. 3. Multiple grade 2-3 hepatic lacerations are noted as detailed above. 4. No significant hemoperitoneum. 5. There are no acute displaced fractures. These results were called by telephone at the time of interpretation on 09/25/2019 at 3:33 am to provider [facility] , who verbally acknowledged these results. Electronically Signed   By: Katherine Mantle M.D.   On: 09/25/2019 03:36   CT  CERVICAL SPINE WO CONTRAST  Result Date: 09/25/2019 CLINICAL DATA:  [place] from 2 story building. Laceration to the top of the head. EXAM: CT HEAD WITHOUT CONTRAST CT CERVICAL SPINE WITHOUT CONTRAST TECHNIQUE: Multidetector CT imaging of the head and cervical spine was performed following the standard protocol without intravenous contrast. Multiplanar CT image reconstructions of the cervical spine were also generated. COMPARISON:  04/03/2016 FINDINGS: CT HEAD FINDINGS Brain: The brain shows a normal appearance without evidence of malformation, atrophy, old or acute small or large vessel infarction, mass lesion, hemorrhage, hydrocephalus or extra-axial collection. Vascular: No hyperdense vessel. No evidence of atherosclerotic calcification. Skull: Normal.  No traumatic finding.  No focal bone lesion. Sinuses/Orbits: Mucosal thickening of the left division of the sphenoid sinus. Other sinuses clear. Orbits negative. Other: Frontal scalp swelling. CT CERVICAL SPINE FINDINGS Alignment: Normal Skull base and vertebrae: Normal Soft tissues and spinal canal: Normal Disc levels: Normal except for ordinary facet osteoarthritis on the left at C3-4. No bony canal or foraminal stenosis. Upper chest: Negative Other: None IMPRESSION: Head CT: No intracranial abnormality. No skull fracture. Frontal scalp hematoma. Cervical spine CT: No acute or traumatic finding. Left-sided facet osteoarthritis C3-4. Electronically Signed   By: Paulina Fusi M.D.   On: 09/25/2019 03:04   CT  ABDOMEN PELVIS W CONTRAST  Result Date: 09/25/2019 CLINICAL DATA:  Acute pain due to trauma. Fall from second story building with laceration to the top of the head as well as right arm pain and back pain. EXAM: CT CHEST, ABDOMEN, AND PELVIS WITH CONTRAST TECHNIQUE: Multidetector CT imaging of the chest, abdomen and pelvis was performed following the standard protocol during bolus administration of intravenous contrast. CONTRAST:  OMNIPAQUE IOHEXOL 300  MG/ML  SOLN COMPARISON:  CT of the pelvis dated July 11, 2007. CT chest dated Jan 06, 2016. FINDINGS: CT CHEST FINDINGS Cardiovascular: There is no evidence for thoracic aortic dissection or aneurysm. No large centrally located pulmonary embolism. The heart size is normal without evidence for a significant pericardial effusion. Mediastinum/Nodes: --No mediastinal or hilar lymphadenopathy. --No axillary lymphadenopathy. --No supraclavicular lymphadenopathy. --the thyroid gland is heterogeneous without definite evidence for thyroid nodule. --The esophagus is unremarkable Lungs/Pleura: There is no pneumothorax. No pleural effusion. No focal infiltrate. Musculoskeletal: There are multiple old healed left-sided rib fractures. There is an old healed fracture of the left scapula and distal left clavicle. CT ABDOMEN PELVIS FINDINGS Hepatobiliary: There is a hepatic laceration measuring approximately 4.1 cm. There is no significant subcapsular hematoma. There may be an additional laceration involving hepatic segment 1 and hepatic segment 7 (axial series 3, image 56) measuring approximately 2 cm each. Status post cholecystectomy.There is no biliary ductal dilation. Pancreas: Normal contours without ductal dilatation. No peripancreatic fluid collection. Spleen: No splenic laceration or hematoma. Adrenals/Urinary Tract: --Adrenal glands: There is mild haziness about the right adrenal gland and IVC at this level. --Right kidney/ureter: There are multiple wedge-shaped defects involving primarily the lower pole the right kidney. --Left kidney/ureter: No hydronephrosis or perinephric hematoma. --Urinary bladder: Unremarkable. Stomach/Bowel: --Stomach/Duodenum: The stomach is distended with an air-fluid level. --Small bowel: No dilatation or inflammation. --Colon: No focal abnormality. --Appendix: Normal. Vascular/Lymphatic: There is fat stranding posterior to the IVC on the right (axial series 3, image 61). There is no evidence  for active arterial extravasation. The aorta at this level is unremarkable aside from a few atherosclerotic calcifications. The IVC is grossly patent. There is some narrowing of the proximal right renal artery which is best appreciated on the sagittal images (sagittal series 6, image 110). --No retroperitoneal lymphadenopathy. --No mesenteric lymphadenopathy. --No pelvic or inguinal lymphadenopathy. Reproductive: Status post hysterectomy. No adnexal mass. Other: No ascites or free air. The abdominal wall is normal. Musculoskeletal. There is an old healed fracture of the right inferior pubic ramus. There is no acute pelvic fracture on today's exam. IMPRESSION: 1. No acute abnormality involving the thorax. Multiple old healed left-sided rib fractures are noted. 2. Fat stranding posterior to the IVC and adjacent to the right adrenal gland. Differential considerations include a right adrenal injury or vascular injury. The IVC and aorta at this level are unremarkable. There is some narrowing of the proximal right renal artery that is best appreciated on the sagittal images. In combination with multiple wedge-shaped defects in the lower pole the right kidney, findings are felt to be related to a right renal artery injury or traumatic dissection. A distinct dissection flap is not well appreciated on this exam. A right renal artery injury is further supported by delayed renal enhancement on the delayed phase. Additionally, there is no obvious excretion of contrast from the right kidney on the delayed phase. 3. Multiple grade 2-3 hepatic lacerations are noted as detailed above. 4. No significant hemoperitoneum. 5. There are no acute displaced fractures. These  results were called by telephone at the time of interpretation on 09/25/2019 at 3:33 am to provider [facility] , who verbally acknowledged these results. Electronically Signed   By: Katherine Mantle M.D.   On: 09/25/2019 03:36   DG Pelvis Portable  Result Date:  09/25/2019 CLINICAL DATA:  Pain status post fall EXAM: PORTABLE PELVIS 1-2 VIEWS COMPARISON:  The patient's prior x-ray from 2017 could not be retrieved at the time of this dictation. FINDINGS: There is no evidence of pelvic fracture or diastasis. No pelvic bone lesions are seen. IMPRESSION: Negative. Electronically Signed   By: Katherine Mantle M.D.   On: 09/25/2019 01:03   DG Chest Port 1 View  Result Date: 09/25/2019 CLINICAL DATA:  Pain status post fall. EXAM: PORTABLE CHEST 1 VIEW COMPARISON:  09/07/2019 FINDINGS: There are multiple old healed left-sided rib fractures. There is no pneumothorax. No large pleural effusion. The heart size is stable from prior study. There is no new acute osseous abnormality detected on this exam. IMPRESSION: No active disease. Electronically Signed   By: Katherine Mantle M.D.   On: 09/25/2019 01:03   DG Humerus Right  Result Date: 09/25/2019 CLINICAL DATA:  Right upper extremity deformity after fall EXAM: RIGHT HUMERUS - 2+ VIEW COMPARISON:  None. FINDINGS: There is comminuted mildly displaced olecranon process fracture. There is mild prominence of the acromioclavicular space measuring 7 mm. Normal bone mineralization seen throughout. IMPRESSION: Comminuted mildly displaced olecranon process fracture. Mild prominence of the [facility] joint which could be due to grade 1 AC joint injury. Electronically Signed   By: Jonna Clark M.D.   On: 09/25/2019 01:38    Positive ROS: All other systems have been reviewed and were otherwise negative with the exception of those mentioned in the HPI and as above.  Physical Exam: General: Alert, no acute distress Cardiovascular: No pedal edema Respiratory: No cyanosis, no use of accessory musculature Psychiatric: Patient is competent for consent with normal mood and affect Lymphatic: No axillary or cervical lymphadenopathy  MUSCULOSKELETAL: The posterior arm splint was removed.  This revealed revealed a moderately swollen right elbow.   There is a small puncture wound over the tip of the olecranon with active, oozing blood.  This does appear to be contiguous with the deep fracture.  She has pain with attempted elbow range of motion.  She has no tenderness in the proximal humerus or distally in the forearm or hand.  She has intact range of motion to the digits.  Her sensation is grossly intact light touch throughout all fingers.  Her fingertips are all warm well perfused with brisk capillary refill.  Assessment: Level 2 trauma with multiple injuries including a type I open, displaced olecranon fracture  Plan: -Admit to trauma for care of her multiple other injuries -Placed on Kefzol for open olecranon fracture -I will discuss plan with trauma as far as timing and plan for definitive surgical fixation of her elbow.  I would like to treat her within the next 24 hours secondary to her open fracture. -Posterior slab splint was once again replaced. -Continue right upper extremity elevation -Keep n.p.o. -Remainder of care per primary    Ernest Mallick, MD [phone]   09/25/2019 9:52 AM   **Addendum**  Patient cleared to proceed to OR. Vascular surgery is planning on an angiogram for a possible renal artery injury. If cleared after this, we will proceed with I&D and ORIF of the right elbow in the same anesthesia setting. The patient is  confused and with history of EtOH and cocaine use. We will proceed forward emergently for a joint case with vascular surgery and hand surgery

## 2019-09-25 NOTE — Progress Notes (Signed)
Pt entered PACU, O2 sats were 77% on Reader. Pt switched to simple mask with not much improvement. Dr Michelle Piper was at bedside and ordered albuterol and stat chest xray. Pt is alert and oriented. Complaining of difficulty breathing.   Dr Roney Mans and Dr Corliss Skains notified of change in pt respiratory status.   Stat ABG drawn by Dr Michelle Piper. Pt O2 sats 85% on simple mask, pt resting & asleep. Transferred care to ICU after updating Dr Corliss Skains.
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
## 2019-09-25 NOTE — Anesthesia Procedure Notes (Signed)
Procedure Name: Intubation Date/Time: 09/25/2019 12:35 PM Performed by: Moshe Salisbury, CRNA Pre-anesthesia Checklist: Patient identified, Emergency Drugs available, Suction available and Patient being monitored Patient Re-evaluated:Patient Re-evaluated prior to induction Oxygen Delivery Method: Circle System Utilized Preoxygenation: Pre-oxygenation with 100% oxygen Induction Type: IV induction Ventilation: Mask ventilation without difficulty Laryngoscope Size: Mac and 3 Grade View: Grade II Tube type: Oral Number of attempts: 1 Airway Equipment and Method: Stylet and Oral airway Placement Confirmation: ETT inserted through vocal cords under direct vision,  positive ETCO2 and breath sounds checked- equal and bilateral Secured at: 20 cm Tube secured with: Tape Dental Injury: Teeth and Oropharynx as per pre-operative assessment

## 2019-09-25 NOTE — ED Notes (Signed)
Patient got up off of the stretcher instructed patient to not get off the stretcher and to use her call bell that is on her rail if help is needed. TTS completed.

## 2019-09-25 NOTE — ED Notes (Signed)
Pt mother, Corrie Dandy [phone]

## 2019-09-25 NOTE — Progress Notes (Signed)
Patient arrives to Short Stay. Alert and Oriented x 4. NSR on monitor. Cooperative. Patient not aware of IVC. PACU/ OR aware of same.

## 2019-09-25 NOTE — OR Nursing (Signed)
Patient interviewed in short stay.  Patient appears drowsy and sleepy but does responds to voice and communicates.  Patient able to state name, date of birth, year, and president name appropriately and understands that she is having surgery.
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
## 2019-09-26 LAB — COMPREHENSIVE METABOLIC PANEL
ALT: 195 U/L — ABNORMAL HIGH (ref 0–44)
AST: 121 U/L — ABNORMAL HIGH (ref 15–41)
Albumin: 3.2 g/dL — ABNORMAL LOW (ref 3.5–5.0)
Alkaline Phosphatase: 118 U/L (ref 38–126)
Anion gap: 9 (ref 5–15)
BUN: 7 mg/dL (ref 6–20)
CO2: 25 mmol/L (ref 22–32)
Calcium: 8.2 mg/dL — ABNORMAL LOW (ref 8.9–10.3)
Chloride: 104 mmol/L (ref 98–111)
Creatinine, Ser: 0.73 mg/dL (ref 0.44–1.00)
GFR calc Af Amer: >60 mL/min (ref >60)
GFR calc non Af Amer: >60 mL/min (ref >60)
Glucose, Bld: 125 mg/dL — ABNORMAL HIGH (ref 70–99)
Potassium: 4.3 mmol/L (ref 3.5–5.1)
Sodium: 138 mmol/L (ref 135–145)
Total Bilirubin: 1 mg/dL (ref 0.3–1.2)
Total Protein: 5.7 g/dL — ABNORMAL LOW (ref 6.5–8.1)

## 2019-09-26 LAB — PROTIME-INR
INR: 1 (ref 0.8–1.2)
Prothrombin Time: 12.7 seconds (ref 11.4–15.2)

## 2019-09-26 LAB — CBC
HCT: 33.1 % — ABNORMAL LOW (ref 36.0–46.0)
Hemoglobin: 10.9 g/dL — ABNORMAL LOW (ref 12.0–15.0)
MCH: 32.8 pg (ref 26.0–34.0)
MCHC: 32.9 g/dL (ref 30.0–36.0)
MCV: 99.7 fL (ref 80.0–100.0)
Platelets: 271 10*3/uL (ref 150–400)
RBC: 3.32 MIL/uL — ABNORMAL LOW (ref 3.87–5.11)
RDW: 12.6 % (ref 11.5–15.5)
WBC: 12.9 10*3/uL — ABNORMAL HIGH (ref 4.0–10.5)
nRBC: 0 % (ref 0.0–0.2)

## 2019-09-26 LAB — MRSA PCR SCREENING: MRSA by PCR: NEGATIVE

## 2019-09-26 MED ORDER — THIAMINE HCL 100 MG PO TABS
100.0000 mg | ORAL_TABLET | Freq: Every day | ORAL | Status: DC
  Administered 2019-09-27 – 2019-10-01 (×5): 100 mg via ORAL
  Filled 2019-09-26 (×5): qty 1

## 2019-09-26 MED ORDER — ADULT MULTIVITAMIN W/MINERALS CH
1.0000 | ORAL_TABLET | Freq: Every day | ORAL | Status: DC
  Administered 2019-09-27 – 2019-10-01 (×5): 1 via ORAL
  Filled 2019-09-26 (×5): qty 1

## 2019-09-26 MED ORDER — ORAL CARE MOUTH RINSE
15.0000 mL | Freq: Two times a day (BID) | OROMUCOSAL | Status: DC
  Administered 2019-09-26 – 2019-09-30 (×8): 15 mL via OROMUCOSAL

## 2019-09-26 MED ORDER — LORAZEPAM 2 MG/ML IJ SOLN
1.0000 mg | INTRAMUSCULAR | Status: AC | PRN
  Administered 2019-09-28: 2 mg via INTRAVENOUS
  Filled 2019-09-26: qty 1

## 2019-09-26 MED ORDER — OXYCODONE HCL 5 MG PO TABS
10.0000 mg | ORAL_TABLET | ORAL | Status: DC | PRN
  Administered 2019-09-26 (×2): 15 mg via ORAL
  Administered 2019-09-26: 10 mg via ORAL
  Administered 2019-09-27 – 2019-09-28 (×5): 15 mg via ORAL
  Filled 2019-09-26 (×4): qty 3
  Filled 2019-09-26: qty 2
  Filled 2019-09-26 (×4): qty 3

## 2019-09-26 MED ORDER — LORAZEPAM 1 MG PO TABS
1.0000 mg | ORAL_TABLET | ORAL | Status: AC | PRN
  Administered 2019-09-27 – 2019-09-28 (×3): 2 mg via ORAL
  Filled 2019-09-26 (×3): qty 2

## 2019-09-26 MED ORDER — FOLIC ACID 1 MG PO TABS: 1.0000 mg | ORAL_TABLET | Freq: Every day | ORAL | Status: DC

## 2019-09-26 MED ORDER — ACETAMINOPHEN 325 MG PO TABS
650.0000 mg | ORAL_TABLET | Freq: Four times a day (QID) | ORAL | Status: DC
  Administered 2019-09-26 – 2019-10-01 (×18): 650 mg via ORAL
  Filled 2019-09-26 (×19): qty 2

## 2019-09-26 MED ORDER — METHOCARBAMOL 750 MG PO TABS
750.0000 mg | ORAL_TABLET | Freq: Three times a day (TID) | ORAL | Status: DC
  Administered 2019-09-26 – 2019-09-27 (×4): 750 mg via ORAL
  Filled 2019-09-26 (×4): qty 1

## 2019-09-26 MED ORDER — THIAMINE HCL 100 MG/ML IJ SOLN: 100.0000 mg | Freq: Every day | INTRAMUSCULAR | Status: DC

## 2019-09-26 NOTE — Progress Notes (Signed)
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 ASA pt states she already takes this pre hospital No follow up needed Will sign off  Fabienne Bruns 09/26/2019 11:09 AM --  Laboratory Lab Results: Recent Labs    09/25/19 1914 09/26/19 0218  WBC 14.8* 12.9*  HGB 11.8* 10.9*  HCT 35.1* 33.1*  PLT 288 271   BMET Recent Labs    09/25/19 0042 09/25/19 0042 09/25/19 0053 09/25/19 0053 09/25/19 1748 09/26/19 0218  NA 134*   < > 134*   < > 135 138  K 3.4*   < > 3.2*   < > 4.5 4.3  CL 99   < > 100  --   --  104  CO2 18*  --   --   --   --  25  GLUCOSE 135*   < > 131*  --   --  125*  BUN 7   < > 6  --   --  7  CREATININE 0.79   < > 1.00  --   --  0.73  CALCIUM 8.9  --   --   --   --  8.2*   < > = values in this interval not displayed.    COAG Lab Results  Component Value Date   INR 1.0 09/26/2019   INR 1.0 09/25/2019   No results found for: PTT
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
## 2019-09-26 NOTE — Progress Notes (Signed)
Pts order for BIPAP is PRN. Pt not in need of at this time. RT will continue to monitor.
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
## 2019-09-27 DIAGNOSIS — F1410 Cocaine abuse, uncomplicated: Secondary | ICD-10-CM | POA: Diagnosis present

## 2019-09-27 DIAGNOSIS — F1010 Alcohol abuse, uncomplicated: Secondary | ICD-10-CM

## 2019-09-27 DIAGNOSIS — F332 Major depressive disorder, recurrent severe without psychotic features: Secondary | ICD-10-CM

## 2019-09-27 LAB — CBC
HCT: 29.5 % — ABNORMAL LOW (ref 36.0–46.0)
Hemoglobin: 9.5 g/dL — ABNORMAL LOW (ref 12.0–15.0)
MCH: 33.2 pg (ref 26.0–34.0)
MCHC: 32.2 g/dL (ref 30.0–36.0)
MCV: 103.1 fL — ABNORMAL HIGH (ref 80.0–100.0)
Platelets: 223 10*3/uL (ref 150–400)
RBC: 2.86 MIL/uL — ABNORMAL LOW (ref 3.87–5.11)
RDW: 12.7 % (ref 11.5–15.5)
WBC: 12 10*3/uL — ABNORMAL HIGH (ref 4.0–10.5)
nRBC: 0 % (ref 0.0–0.2)

## 2019-09-27 LAB — BASIC METABOLIC PANEL
Anion gap: 9 (ref 5–15)
BUN: <5 mg/dL — ABNORMAL LOW (ref 6–20)
CO2: 25 mmol/L (ref 22–32)
Calcium: 7.9 mg/dL — ABNORMAL LOW (ref 8.9–10.3)
Chloride: 104 mmol/L (ref 98–111)
Creatinine, Ser: 0.55 mg/dL (ref 0.44–1.00)
GFR calc Af Amer: >60 mL/min (ref >60)
GFR calc non Af Amer: >60 mL/min (ref >60)
Glucose, Bld: 110 mg/dL — ABNORMAL HIGH (ref 70–99)
Potassium: 3.8 mmol/L (ref 3.5–5.1)
Sodium: 138 mmol/L (ref 135–145)

## 2019-09-27 LAB — MAGNESIUM: Magnesium: 1.8 mg/dL (ref 1.7–2.4)

## 2019-09-27 LAB — PHOSPHORUS: Phosphorus: 2.6 mg/dL (ref 2.5–4.6)

## 2019-09-27 MED ORDER — DIPHENHYDRAMINE HCL 25 MG PO CAPS
25.0000 mg | ORAL_CAPSULE | Freq: Four times a day (QID) | ORAL | Status: DC | PRN
  Administered 2019-09-27: 25 mg via ORAL
  Administered 2019-09-30 (×3): 50 mg via ORAL
  Administered 2019-10-01: 25 mg via ORAL
  Filled 2019-09-27 (×2): qty 1
  Filled 2019-09-27: qty 2
  Filled 2019-09-27: qty 1
  Filled 2019-09-27: qty 2
  Filled 2019-09-27 (×2): qty 1

## 2019-09-27 MED ORDER — POLYETHYLENE GLYCOL 3350 17 G PO PACK
17.0000 g | PACK | Freq: Every day | ORAL | Status: DC
  Administered 2019-09-27 – 2019-10-01 (×5): 17 g via ORAL
  Filled 2019-09-27 (×5): qty 1

## 2019-09-27 MED ORDER — ALBUTEROL SULFATE (2.5 MG/3ML) 0.083% IN NEBU
2.5000 mg | INHALATION_SOLUTION | Freq: Four times a day (QID) | RESPIRATORY_TRACT | Status: AC
  Administered 2019-09-27 (×3): 2.5 mg via RESPIRATORY_TRACT
  Filled 2019-09-27 (×4): qty 3

## 2019-09-27 MED ORDER — NICOTINE 21 MG/24HR TD PT24
21.0000 mg | MEDICATED_PATCH | Freq: Every day | TRANSDERMAL | Status: DC
  Administered 2019-09-27 – 2019-10-01 (×5): 21 mg via TRANSDERMAL
  Filled 2019-09-27 (×5): qty 1

## 2019-09-27 MED ORDER — GUAIFENESIN ER 600 MG PO TB12
600.0000 mg | ORAL_TABLET | Freq: Two times a day (BID) | ORAL | Status: DC
  Administered 2019-09-27 – 2019-10-01 (×9): 600 mg via ORAL
  Filled 2019-09-27 (×9): qty 1

## 2019-09-27 MED ORDER — MORPHINE SULFATE (PF) 2 MG/ML IV SOLN
1.0000 mg | INTRAVENOUS | Status: DC | PRN
  Filled 2019-09-27: qty 1

## 2019-09-27 MED ORDER — QUETIAPINE FUMARATE 50 MG PO TABS
50.0000 mg | ORAL_TABLET | Freq: Every day | ORAL | Status: DC
  Administered 2019-09-27 – 2019-09-29 (×3): 50 mg via ORAL
  Filled 2019-09-27 (×3): qty 1

## 2019-09-27 MED ORDER — DIPHENHYDRAMINE HCL 50 MG/ML IJ SOLN
25.0000 mg | Freq: Four times a day (QID) | INTRAMUSCULAR | Status: DC | PRN
  Administered 2019-09-27: 25 mg via INTRAVENOUS
  Administered 2019-09-29 – 2019-10-01 (×2): 50 mg via INTRAVENOUS
  Filled 2019-09-27 (×3): qty 1

## 2019-09-27 MED ORDER — CEFAZOLIN SODIUM-DEXTROSE 1-4 GM/50ML-% IV SOLN
1.0000 g | Freq: Three times a day (TID) | INTRAVENOUS | Status: AC
  Administered 2019-09-27 – 2019-09-28 (×3): 1 g via INTRAVENOUS
  Filled 2019-09-27 (×3): qty 50

## 2019-09-27 MED ORDER — METHOCARBAMOL 500 MG PO TABS
1000.0000 mg | ORAL_TABLET | Freq: Three times a day (TID) | ORAL | Status: DC
  Administered 2019-09-27 – 2019-10-01 (×11): 1000 mg via ORAL
  Filled 2019-09-27 (×11): qty 2
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
## 2019-09-28 ENCOUNTER — Inpatient Hospital Stay (HOSPITAL_COMMUNITY): Payer: Self-pay

## 2019-09-28 ENCOUNTER — Encounter: Payer: Self-pay | Admitting: *Deleted

## 2019-09-28 LAB — MAGNESIUM: Magnesium: 1.7 mg/dL (ref 1.7–2.4)

## 2019-09-28 LAB — BASIC METABOLIC PANEL
Anion gap: 10 (ref 5–15)
BUN: <5 mg/dL — ABNORMAL LOW (ref 6–20)
CO2: 28 mmol/L (ref 22–32)
Calcium: 8.1 mg/dL — ABNORMAL LOW (ref 8.9–10.3)
Chloride: 101 mmol/L (ref 98–111)
Creatinine, Ser: 0.67 mg/dL (ref 0.44–1.00)
GFR calc Af Amer: >60 mL/min (ref >60)
GFR calc non Af Amer: >60 mL/min (ref >60)
Glucose, Bld: 94 mg/dL (ref 70–99)
Potassium: 3.7 mmol/L (ref 3.5–5.1)
Sodium: 139 mmol/L (ref 135–145)

## 2019-09-28 LAB — CBC
HCT: 28.2 % — ABNORMAL LOW (ref 36.0–46.0)
Hemoglobin: 9 g/dL — ABNORMAL LOW (ref 12.0–15.0)
MCH: 33.1 pg (ref 26.0–34.0)
MCHC: 31.9 g/dL (ref 30.0–36.0)
MCV: 103.7 fL — ABNORMAL HIGH (ref 80.0–100.0)
Platelets: 237 10*3/uL (ref 150–400)
RBC: 2.72 MIL/uL — ABNORMAL LOW (ref 3.87–5.11)
RDW: 12.8 % (ref 11.5–15.5)
WBC: 11.2 10*3/uL — ABNORMAL HIGH (ref 4.0–10.5)
nRBC: 0 % (ref 0.0–0.2)

## 2019-09-28 LAB — PHOSPHORUS: Phosphorus: 2.9 mg/dL (ref 2.5–4.6)

## 2019-09-28 MED ORDER — OXYCODONE HCL 5 MG PO TABS
5.0000 mg | ORAL_TABLET | ORAL | Status: DC | PRN
  Administered 2019-09-28 – 2019-10-01 (×12): 10 mg via ORAL
  Filled 2019-09-28 (×12): qty 2

## 2019-09-28 MED ORDER — ALBUTEROL SULFATE (2.5 MG/3ML) 0.083% IN NEBU
2.5000 mg | INHALATION_SOLUTION | Freq: Four times a day (QID) | RESPIRATORY_TRACT | Status: AC
  Administered 2019-09-28 (×2): 2.5 mg via RESPIRATORY_TRACT
  Filled 2019-09-28 (×3): qty 3

## 2019-09-28 MED ORDER — FUROSEMIDE 40 MG PO TABS
40.0000 mg | ORAL_TABLET | Freq: Once | ORAL | Status: AC
  Administered 2019-09-28: 40 mg via ORAL
  Filled 2019-09-28: qty 1

## 2019-09-28 NOTE — Evaluation (Signed)
Physical Therapy Evaluation Patient Details Name: [name] MRN: [id number] DOB: 09-26-1970 Today's Date: 09/28/2019   History of Present Illness  Patient is a 49 y/o female admitted after fall from 2nd story about 34' (possible suicide attempt).  Admitted with R olecranon open injury s/p ORIF and kidney injury and liver laceration.  She was on bedrest for laceration until today.  Clinical Impression  Patient presents with mobility limited due to pain, decreased balance, decreased activity tolerance with HR up to 136 with ambulation on O2, and decreased safety/deficit awareness.  Currently needing min A for mobility and she will benefit from skilled PT in the acute setting to allow return home with family support versus d/c to inpatient psychiatric unit.  May need OT follow up for hand/ arm rehab when ready.     Follow Up Recommendations No PT follow up    Equipment Recommendations  None recommended by PT    Recommendations for Other Services       Precautions / Restrictions Precautions Precautions: Fall Restrictions Weight Bearing Restrictions: Yes RUE Weight Bearing: Non weight bearing      Mobility  Bed Mobility Overal bed mobility: Needs Assistance Bed Mobility: Supine to Sit     Supine to sit: Min assist;HOB elevated     General bed mobility comments: assist for safety/balance/lines  Transfers Overall transfer level: Needs assistance Equipment used: 1 person hand held assist Transfers: Sit to/from Stand Sit to Stand: Min assist         General transfer comment: assist for balance, pt reaching for IV pole, min A in hallway without device, HR max 136  Ambulation/Gait Ambulation/Gait assistance: Mod assist;Min assist Gait Distance (Feet): 140 Feet Assistive device: None;1 person hand held assist Gait Pattern/deviations: Step-through pattern;Drifts right/left;Decreased stride length     General Gait Details: fatigued and assist for balance/safety/HR max  136 with ambulation  Stairs            Wheelchair Mobility    Modified Rankin (Stroke Patients Only)       Balance Overall balance assessment: Needs assistance   Sitting balance-Leahy Scale: Good       Standing balance-Leahy Scale: Fair Standing balance comment: assist for ambulation, but static balance unsupported                             Pertinent Vitals/Pain Pain Assessment: Faces Faces Pain Scale: Hurts little more Pain Location: R arm Pain Descriptors / Indicators: Grimacing;Guarding Pain Intervention(s): Monitored during session;Repositioned;Patient requesting pain meds-RN notified    Home Living Family/patient expects to be discharged to:: Private residence Living Arrangements: Spouse/significant other Available Help at Discharge: Friend(s) Type of Home: Apartment Home Access: Level entry     Home Layout: One level Home Equipment: None      Prior Function Level of Independence: Independent               Hand Dominance        Extremity/Trunk Assessment   Upper Extremity Assessment Upper Extremity Assessment: RUE deficits/detail RUE Deficits / Details: splinted after ORIF for open olecranon fx, encouraged finger ROM    Lower Extremity Assessment Lower Extremity Assessment: Generalized weakness       Communication   Communication: No difficulties  Cognition Arousal/Alertness: Lethargic Behavior During Therapy: Anxious Overall Cognitive Status: Impaired/Different from baseline Area of Impairment: Orientation;Safety/judgement;Attention;Following commands                 Orientation  Level: Disoriented to;Time Current Attention Level: Sustained     Safety/Judgement: Decreased awareness of safety     General Comments: easily agitated with questions, ignoring questions as to why she is here initially, then said fell from balcony. kept perseverating on going home today      General Comments General comments  (skin integrity, edema, etc.): toileted on bedpan prior to ambulation with sitter in room to assist; up in chair with sitter in room end of session    Exercises Other Exercises Other Exercises: Finger flex/ext x 5 on R and abd/add x 5   Assessment/Plan    PT Assessment Patient needs continued PT services  PT Problem List Decreased strength;Decreased activity tolerance;Decreased mobility;Decreased safety awareness;Decreased balance;Decreased range of motion;Decreased knowledge of use of DME;Pain       PT Treatment Interventions DME instruction;Therapeutic activities;Balance training;Therapeutic exercise;Gait training;Functional mobility training;Patient/family education    PT Goals (Current goals can be found in the Care Plan section)  Acute Rehab PT Goals Patient Stated Goal: to go home today PT Goal Formulation: With patient Time For Goal Achievement: 10/05/19 Potential to Achieve Goals: Good    Frequency Min 4X/week   Barriers to discharge Decreased caregiver support significant other works nights    Co-evaluation               AM-PAC PT "6 Clicks" Mobility  Outcome Measure Help needed turning from your back to your side while in a flat bed without using bedrails?: A Little Help needed moving from lying on your back to sitting on the side of a flat bed without using bedrails?: A Little Help needed moving to and from a bed to a chair (including a wheelchair)?: A Little Help needed standing up from a chair using your arms (e.g., wheelchair or bedside chair)?: A Little Help needed to walk in hospital room?: A Little Help needed climbing 3-5 steps with a railing? : A Little 6 Click Score: 18    End of Session Equipment Utilized During Treatment: Oxygen Activity Tolerance: Patient limited by fatigue Patient left: in chair;with call bell/phone within reach;with nursing/sitter in room   PT Visit Diagnosis: Other abnormalities of gait and mobility (R26.89);Pain Pain -  Right/Left: Right Pain - part of body: Arm    Time: 1012-1040 PT Time Calculation (min) (ACUTE ONLY): 28 min   Charges:   PT Evaluation $PT Eval Moderate Complexity: 1 Mod PT Treatments $Gait Training: 8-22 mins        Magda Kiel, [place] [facility] [phone] 09/28/2019   Reginia Naas 09/28/2019, 11:51 AM

## 2019-09-28 NOTE — Progress Notes (Signed)
Patient almost fell with suicide sitter today.  She also wants symbicort but we dont have that available to order. She is now IVC.

## 2019-09-28 NOTE — TOC Initial Note (Addendum)
Transition of Care [facility]) - Initial/Assessment Note    Patient Details  Name: [name] MRN: [id number] Date of Birth: 1971/05/26  [facility] CM/SW Contact:    Glennon Mac, RN Phone Number: 09/28/2019, 2:44 PM  Clinical Narrative: Patient is a 49 y/o female admitted after fall from 2nd story about 35' (possible suicide attempt).  Admitted with R olecranon open injury s/p ORIF and kidney injury and liver laceration.  PTA, pt independent, lives at home with significant other.  PT/OT recommending no OP follow up.   Psych MD recommending inpatient psychiatric admission due to possible suicide attempt.  IVC papers served today.  Will arrange transfer to psychiatric hospital upon medical stability.  Expected Discharge Plan: Psychiatric Hospital Barriers to Discharge: Continued Medical Work up           Expected Discharge Plan and Services Expected Discharge Plan: Psychiatric Hospital   Discharge Planning Services: CM Consult   Living arrangements for the past 2 months: Single Family Home                                      Prior Living Arrangements/Services Living arrangements for the past 2 months: Single Family Home Lives with:: Significant Other Patient language and need for interpreter reviewed:: Yes Do you feel safe going back to the place where you live?: Yes            Criminal Activity/Legal Involvement Pertinent to Current Situation/Hospitalization: No - Comment as needed  Activities of Daily Living Home Assistive Devices/Equipment: None ADL Screening (condition at time of admission) Patient's cognitive ability adequate to safely complete daily activities?: Yes Is the patient deaf or have difficulty hearing?: No Does the patient have difficulty seeing, even when wearing glasses/contacts?: No Does the patient have difficulty concentrating, remembering, or making decisions?: No Patient able to express need for assistance with ADLs?:  No Does the patient have difficulty dressing or bathing?: No Independently performs ADLs?: Yes (appropriate for developmental age) Does the patient have difficulty walking or climbing stairs?: No Weakness of Legs: None Weakness of Arms/Hands: None        Alcohol / Substance Use: Alcohol Use, Illicit Drugs Psych Involvement: Yes (comment)  Admission diagnosis:  Trauma [T14.90XA] Fall [W19.XXXA] Liver laceration [S36.113A] Injury of right kidney, initial encounter [S37.001A] Laceration of liver, initial encounter [S36.113A] Laceration of forehead, initial encounter [S01.81XA] Closed fracture of olecranon process of right ulna, initial encounter [S52.021A] Patient Active Problem List   Diagnosis Date Noted  . Alcohol abuse 09/27/2019  . Cocaine abuse (HCC) 09/27/2019  . Major depressive disorder, recurrent severe without psychotic features (HCC) 09/27/2019  . Liver laceration 09/25/2019   PCP:  Patient, No Pcp Per Pharmacy:   [facility] [address] [facility] [place] - [address] [facility] [place] [address] Phone: [phone] Fax: [phone]     Social Determinants of Health (SDOH) Interventions    Readmission Risk Interventions No flowsheet data found.  Quintella Baton, RN, BSN  Trauma/Neuro ICU Case Manager [phone]

## 2019-09-28 NOTE — Progress Notes (Signed)
[facility] Progress Note  3 Days Post-Op  Subjective: CC-  Very tired this morning. Per RN she woke up well a couple times throughout the night, complained of pain, was completely alert and oriented. She was given oxy 15mg  after 6am. She also received her home dose seroquel last night and ativan. O2 sats stable on 3L. Denies any pain this morning.  ROS: See above, otherwise other systems negative   Objective: Vital signs in last 24 hours: Temp:  [97.8 F (36.6 C)-99.7 F (37.6 C)] 97.8 F (36.6 C) (02/09 0403) Pulse Rate:  [113-132] 115 (02/09 0633) Resp:  [15-20] 15 (02/09 0633) BP: (146-154)/(80-95) 153/95 (02/09 0000) SpO2:  [89 %-100 %] 89 % (02/09 10-[birth year]-1977)    Intake/Output from previous day: 02/08 0701 - 02/09 0700 In: 1297.5 [P.O.:840; I.V.:307.5; IV Piggyback:150] Out: -  Intake/Output this shift: No intake/output data recorded.  PE: Gen:  Drowsy, difficult to arouse but will wake up and answer questions, NAD HEENT: EOM's intact, pupils equal and round Card:  RRR, no M/G/R heard, 2+ DP pulses Pulm:  Diffuse rhonchi and expiratory wheezing bilaterally, rate and effort normal, weaned from 3L to 2L supplemental O2 and sats remained 100% Abd: Soft, ND, +BS, no HSM, no hernia, nontender Ext:  no BLE edema, calves soft and nontender. Splint to RUE, fingers WWP with good cap refill, able to wiggle fingers Psych: A&Ox3 Skin: no rashes noted, warm and dry   Lab Results:  Recent Labs    09/27/19 0557 09/28/19 0643  WBC 12.0* 11.2*  HGB 9.5* 9.0*  HCT 29.5* 28.2*  PLT 223 237   BMET Recent Labs    09/27/19 0557 09/28/19 0643  NA 138 139  K 3.8 3.7  CL 104 101  CO2 25 28  GLUCOSE 110* 94  BUN <5* <5*  CREATININE 0.55 0.67  CALCIUM 7.9* 8.1*   PT/INR Recent Labs    09/26/19 0218  LABPROT 12.7  INR 1.0   CMP     Component Value Date/Time   NA 139 09/28/2019 0643   K 3.7 09/28/2019 0643   CL 101 09/28/2019 0643   CO2 28 09/28/2019 0643   GLUCOSE 94 09/28/2019 0643   BUN <5 (L) 09/28/2019 0643   CREATININE 0.67 09/28/2019 0643   CALCIUM 8.1 (L) 09/28/2019 0643   PROT 5.7 (L) 09/26/2019 0218   ALBUMIN 3.2 (L) 09/26/2019 0218   AST 121 (H) 09/26/2019 0218   ALT 195 (H) 09/26/2019 0218   ALKPHOS 118 09/26/2019 0218   BILITOT 1.0 09/26/2019 0218   GFRNONAA >60 09/28/2019 0643   GFRAA >60 09/28/2019 0643   Lipase  No results found for: LIPASE     Studies/Results: No results found.  Anti-infectives: Anti-infectives (From admission, onward)   Start     Dose/Rate Route Frequency Ordered Stop   09/27/19 0930  ceFAZolin (ANCEF) IVPB 1 g/50 mL premix     1 g 100 mL/hr over 30 Minutes Intravenous Every 8 hours 09/27/19 0916 09/28/19 0347   09/25/19 1400  ceFAZolin (ANCEF) IVPB 2g/100 mL premix     2 g 200 mL/hr over 30 Minutes Intravenous Every 8 hours 09/25/19 1016 09/26/19 1702       Assessment/Plan Fall vs jump from second floor ?Suicidal ideation - continue suicide precautions/sitter, psych recommending IP psych admission Grade 2-3 Liver laceration - d/c bedrest 2/9. Monitor h/h Right renal artery injury - s/p angiogram 2/6 Dr. 4/9, Non flow limiting dissection, continue ASA, no follow up needed ABL  anemia - 2/2 above. Hg 9 from 9.5, recheck in AM Open olecranon fx - s/p ORIF 2/6 Dr. Jeannie Fend. NWB RUE, splint and elevate, encourage digit ROM. Follow up 10-14 days postop SOB/wheezing - schedule nebs x24 hours again, continue mucinex. On 2L [place] Alcohol in toxication - CIWA Substance abuse (cocaine + upon admission) - SBIRT Bipolar disorder - home seroquel Asthma/COPD Tobacco abuse - nicotine patch  ID - ancef 2/6>>2/9 for open fx FEN - KVO IVF, advance reg diet VTE - SCDs, hold lovenox until H/H stable Foley - none Follow up - ortho, trauma, TBD  Plan - D/c bedrest. PT/OT. Decrease oxycodone to 5-10mg . Ultimately will require IP psych admission.    LOS: 3 days    Wellington Hampshire, [facility] 09/28/2019, 7:57 AM Please see Amion for pager number during day hours 7:00am-4:30pm

## 2019-09-29 ENCOUNTER — Encounter (HOSPITAL_COMMUNITY): Payer: Self-pay

## 2019-09-29 LAB — BASIC METABOLIC PANEL
Anion gap: 11 (ref 5–15)
BUN: 5 mg/dL — ABNORMAL LOW (ref 6–20)
CO2: 27 mmol/L (ref 22–32)
Calcium: 8.6 mg/dL — ABNORMAL LOW (ref 8.9–10.3)
Chloride: 95 mmol/L — ABNORMAL LOW (ref 98–111)
Creatinine, Ser: 0.65 mg/dL (ref 0.44–1.00)
GFR calc Af Amer: >60 mL/min (ref >60)
GFR calc non Af Amer: >60 mL/min (ref >60)
Glucose, Bld: 198 mg/dL — ABNORMAL HIGH (ref 70–99)
Potassium: 3.8 mmol/L (ref 3.5–5.1)
Sodium: 133 mmol/L — ABNORMAL LOW (ref 135–145)

## 2019-09-29 LAB — CBC
HCT: 28 % — ABNORMAL LOW (ref 36.0–46.0)
Hemoglobin: 9.2 g/dL — ABNORMAL LOW (ref 12.0–15.0)
MCH: 33.1 pg (ref 26.0–34.0)
MCHC: 32.9 g/dL (ref 30.0–36.0)
MCV: 100.7 fL — ABNORMAL HIGH (ref 80.0–100.0)
Platelets: 247 10*3/uL (ref 150–400)
RBC: 2.78 MIL/uL — ABNORMAL LOW (ref 3.87–5.11)
RDW: 12.3 % (ref 11.5–15.5)
WBC: 12.5 10*3/uL — ABNORMAL HIGH (ref 4.0–10.5)
nRBC: 0 % (ref 0.0–0.2)

## 2019-09-29 LAB — MAGNESIUM: Magnesium: 1.7 mg/dL (ref 1.7–2.4)

## 2019-09-29 LAB — AMMONIA: Ammonia: 41 umol/L — ABNORMAL HIGH (ref 9–35)

## 2019-09-29 LAB — PHOSPHORUS: Phosphorus: 3.9 mg/dL (ref 2.5–4.6)

## 2019-09-29 MED ORDER — ASCORBIC ACID 500 MG PO TABS
500.0000 mg | ORAL_TABLET | Freq: Every day | ORAL | Status: DC
  Administered 2019-09-29 – 2019-10-01 (×3): 500 mg via ORAL
  Filled 2019-09-29 (×3): qty 1

## 2019-09-29 MED ORDER — ENOXAPARIN SODIUM 40 MG/0.4ML ~~LOC~~ SOLN
40.0000 mg | SUBCUTANEOUS | Status: DC
  Administered 2019-09-29 – 2019-10-01 (×3): 40 mg via SUBCUTANEOUS
  Filled 2019-09-29 (×3): qty 0.4

## 2019-09-29 MED ORDER — ALBUTEROL SULFATE (2.5 MG/3ML) 0.083% IN NEBU
2.5000 mg | INHALATION_SOLUTION | Freq: Four times a day (QID) | RESPIRATORY_TRACT | Status: DC | PRN
  Administered 2019-09-30 – 2019-10-01 (×3): 2.5 mg via RESPIRATORY_TRACT
  Filled 2019-09-29 (×3): qty 3

## 2019-09-29 MED ORDER — METOPROLOL TARTRATE 5 MG/5ML IV SOLN
5.0000 mg | Freq: Four times a day (QID) | INTRAVENOUS | Status: DC | PRN
  Administered 2019-09-30: 5 mg via INTRAVENOUS
  Filled 2019-09-29 (×2): qty 5

## 2019-09-29 MED ORDER — FERROUS SULFATE 325 (65 FE) MG PO TABS
325.0000 mg | ORAL_TABLET | Freq: Two times a day (BID) | ORAL | Status: DC
  Administered 2019-09-29 – 2019-10-01 (×5): 325 mg via ORAL
  Filled 2019-09-29 (×5): qty 1

## 2019-09-29 MED ORDER — LACTULOSE 10 GM/15ML PO SOLN
10.0000 g | Freq: Once | ORAL | Status: AC
  Administered 2019-09-29: 11:00:00 10 g via ORAL
  Filled 2019-09-29: qty 15

## 2019-09-29 NOTE — Progress Notes (Addendum)
[facility] Progress Note  4 Days Post-Op  Subjective: CC-  Patient more interactive than yesterday, but still seems drowsy and fell asleep mid-conversation. Complaining of RUE pain. O2 sats stable on 2L [place]. Pulling ~800 on IS. Coughing up phlegm. Wheezing improved. Tolerating diet. BM this morning. Does not want to go to IP psych facility. We discussed the reasoning.  ROS: See above, otherwise other systems negative   Objective: Vital signs in last 24 hours: Temp:  [98 F (36.7 C)-99.9 F (37.7 C)] 98 F (36.7 C) (02/10 0300) Pulse Rate:  [112-135] 124 (02/10 0341) Resp:  [17-23] 22 (02/10 0341) BP: (138-175)/(70-107) 138/76 (02/10 0340) SpO2:  [95 %-100 %] 95 % (02/10 0341)    Intake/Output from previous day: 02/09 0701 - 02/10 0700 In: 1780 [P.O.:1780] Out: -  Intake/Output this shift: No intake/output data recorded.  PE: Gen: Drowsy but will arouse, NAD HEENT: EOM's intact, pupils equal and round Card: mild tachy, no M/G/R heard, 2+ DP pulses Pulm:Diffuse rhonchi, no wheezing, rate and effort normal, weaned from 2L to 1L supplemental O2 and sats remained upper 90's Abd: Soft, ND, +BS, no HSM, no hernia,nontender Ext: no BLE edema, calves soft and nontender. Splint to RUE, fingers WWP with good cap refill, able to wiggle fingers Psych: A&Ox3 Skin: no rashes noted, warm and dry   Lab Results:  Recent Labs    09/28/19 0643 09/29/19 0450  WBC 11.2* 12.5*  HGB 9.0* 9.2*  HCT 28.2* 28.0*  PLT 237 247   BMET Recent Labs    09/28/19 0643 09/29/19 0450  NA 139 133*  K 3.7 3.8  CL 101 95*  CO2 28 27  GLUCOSE 94 198*  BUN <5* 5*  CREATININE 0.67 0.65  CALCIUM 8.1* 8.6*   PT/INR No results for input(s): LABPROT, INR in the last 72 hours. CMP     Component Value Date/Time   NA 133 (L) 09/29/2019 0450   K 3.8 09/29/2019 0450   CL 95 (L) 09/29/2019 0450   CO2 27 09/29/2019 0450   GLUCOSE 198 (H) 09/29/2019 0450   BUN 5 (L) 09/29/2019  0450   CREATININE 0.65 09/29/2019 0450   CALCIUM 8.6 (L) 09/29/2019 0450   PROT 5.7 (L) 09/26/2019 0218   ALBUMIN 3.2 (L) 09/26/2019 0218   AST 121 (H) 09/26/2019 0218   ALT 195 (H) 09/26/2019 0218   ALKPHOS 118 09/26/2019 0218   BILITOT 1.0 09/26/2019 0218   GFRNONAA >60 09/29/2019 0450   GFRAA >60 09/29/2019 0450   Lipase  No results found for: LIPASE     Studies/Results: DG CHEST PORT 1 VIEW  Result Date: 09/28/2019 CLINICAL DATA:  Shortness of breath. Follow-up right lung atelectasis. Status post 2 story fall. EXAM: PORTABLE CHEST 1 VIEW COMPARISON:  09/25/2019 FINDINGS: Improved aeration of the right upper lobe. Interval right basilar airspace opacity and small to moderate-sized right pleural effusion. Clear left lung. Stable old, healed left rib fractures. IMPRESSION: 1. Improved right upper lobe atelectasis. 2. Interval right basilar atelectasis, pneumonia or aspiration pneumonitis and small to moderate-sized right pleural effusion. Electronically Signed   By: Claudie Revering M.D.   On: 09/28/2019 10:07    Anti-infectives: Anti-infectives (From admission, onward)   Start     Dose/Rate Route Frequency Ordered Stop   09/27/19 0930  ceFAZolin (ANCEF) IVPB 1 g/50 mL premix     1 g 100 mL/hr over 30 Minutes Intravenous Every 8 hours 09/27/19 0916 09/28/19 0347   09/25/19 1400  ceFAZolin (  ANCEF) IVPB 2g/100 mL premix     2 g 200 mL/hr over 30 Minutes Intravenous Every 8 hours 09/25/19 1016 09/26/19 1702       Assessment/Plan Fallvsjump from second floor ?Suicidal ideation- continue suicide precautions/sitter, psych recommending IP psych admission. IVC 2/9 Grade 2-3Liver laceration- d/c bedrest 2/9. Hgb stable Right renal artery injury- s/p angiogram 2/6 Dr. Dessa Phi flow limiting dissection, continue ASA, no follow up needed ABL anemia- 2/2 above. Hg 9.2 from 9, stable. Add iron and vitamin C supplementation  Open olecranonfx- s/p ORIF 2/6 Dr. Roney Mans. NWB RUE,  splint and elevate, encourage digit ROM. Follow up 10-14 days postop SOB/wheezing - improved. Nebs PRN. continue mucinex. Decreased to 1L [place], wean to room air Alcohol in toxication- CIWA Substance abuse (cocaine + upon admission)- SBIRT Bipolar disorder - home seroquel Asthma/COPD Tobacco abuse - nicotine patch Elevated glucose - check A1c  ID -ancef 2/6>>2/9 for open fx FEN -KVO IVF, reg diet VTE -SCDs, start lovenox and recheck CBC in AM Foley -none Follow up- ortho, trauma, psych  Plan- Continue therapies, OT consult pending. Check ammonia level. Wean oxygen to room air. Ultimately will require IP psych admission.    LOS: 4 days    Franne Forts, [facility] 09/29/2019, 8:32 AM Please see Amion for pager number during day hours 7:00am-4:30pm
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
## 2019-09-29 NOTE — Progress Notes (Signed)
Physical Therapy Treatment Patient Details Name: [name] MRN: [id number] DOB: 1970-12-08 Today's Date: 09/29/2019    History of Present Illness Patient is a 49 y/o female admitted after fall from 2nd story about 78' (possible suicide attempt).  Admitted with R olecranon open injury s/p ORIF and kidney injury and liver laceration.  She was on bedrest for laceration until today.    PT Comments    Patient progressing with ambulation distance and today on RA with sats hanging at 89%.  She continues to demonstrate short shuffling gait, but able to walk with minguard A initially, then heavy arm assisted due to pt complaints.  Feel she will progress and be able to go home with assist of her significant other.  PT to follow.    Follow Up Recommendations  No PT follow up     Equipment Recommendations  None recommended by PT    Recommendations for Other Services       Precautions / Restrictions Precautions Precautions: Fall Precaution Comments: splint R UE Restrictions Weight Bearing Restrictions: Yes RUE Weight Bearing: Non weight bearing    Mobility  Bed Mobility               General bed mobility comments: up in chair  Transfers     Transfers: Sit to/from Stand;Stand Pivot Transfers Sit to Stand: Supervision Stand pivot transfers: Supervision       General transfer comment: assist for lines and safey pivot to [facility]  Ambulation/Gait Ambulation/Gait assistance: Min assist;Min guard Gait Distance (Feet): 200 Feet Assistive device: None;1 person hand held assist Gait Pattern/deviations: Step-to pattern;Step-through pattern;Shuffle;Decreased stride length;Wide base of support     General Gait Details: HR 139 with ambulation SpO2 88% at lowest on RA, 92% back in chair; short shuffling steps, at times gave support to heavy R UE & splint   Stairs             Wheelchair Mobility    Modified Rankin (Stroke Patients Only)       Balance Overall  balance assessment: Needs assistance   Sitting balance-Leahy Scale: Good     Standing balance support: No upper extremity supported Standing balance-Leahy Scale: Fair                              Cognition Arousal/Alertness: Awake/alert Behavior During Therapy: Anxious Overall Cognitive Status: Impaired/Different from baseline Area of Impairment: Attention;Safety/judgement                   Current Attention Level: Sustained           General Comments: still perseverating on when she will get to go home      Exercises      General Comments General comments (skin integrity, edema, etc.): toileted on BSC before and after ambulation, pt able to perform hygiene when cued      Pertinent Vitals/Pain Pain Assessment: Faces Faces Pain Scale: Hurts even more Pain Location: R arm and ribs on R when coughing Pain Descriptors / Indicators: Grimacing;Moaning;Aching Pain Intervention(s): Monitored during session;Repositioned    Home Living                      Prior Function            PT Goals (current goals can now be found in the care plan section) Progress towards PT goals: Progressing toward goals    Frequency  Min 4X/week      PT Plan Current plan remains appropriate    Co-evaluation              AM-PAC PT "6 Clicks" Mobility   Outcome Measure  Help needed turning from your back to your side while in a flat bed without using bedrails?: A Little Help needed moving from lying on your back to sitting on the side of a flat bed without using bedrails?: None Help needed moving to and from a bed to a chair (including a wheelchair)?: None Help needed standing up from a chair using your arms (e.g., wheelchair or bedside chair)?: A Little Help needed to walk in hospital room?: A Little Help needed climbing 3-5 steps with a railing? : A Little 6 Click Score: 20    End of Session   Activity Tolerance: Patient limited by  pain Patient left: in chair;with call bell/phone within reach;with nursing/sitter in room   PT Visit Diagnosis: Other abnormalities of gait and mobility (R26.89);Pain Pain - Right/Left: Right Pain - part of body: Arm     Time: 5643-3295 PT Time Calculation (min) (ACUTE ONLY): 18 min  Charges:  $Gait Training: 8-22 mins                     Sheran Lawless, [facility] Rehabilitation Services [phone] 09/29/2019    [name] 09/29/2019, 11:44 AM

## 2019-09-30 LAB — BASIC METABOLIC PANEL
Anion gap: 11 (ref 5–15)
BUN: 5 mg/dL — ABNORMAL LOW (ref 6–20)
CO2: 27 mmol/L (ref 22–32)
Calcium: 8.9 mg/dL (ref 8.9–10.3)
Chloride: 97 mmol/L — ABNORMAL LOW (ref 98–111)
Creatinine, Ser: 0.7 mg/dL (ref 0.44–1.00)
GFR calc Af Amer: >60 mL/min (ref >60)
GFR calc non Af Amer: >60 mL/min (ref >60)
Glucose, Bld: 133 mg/dL — ABNORMAL HIGH (ref 70–99)
Potassium: 4.3 mmol/L (ref 3.5–5.1)
Sodium: 135 mmol/L (ref 135–145)

## 2019-09-30 LAB — CBC
HCT: 29.4 % — ABNORMAL LOW (ref 36.0–46.0)
Hemoglobin: 9.9 g/dL — ABNORMAL LOW (ref 12.0–15.0)
MCH: 33.1 pg (ref 26.0–34.0)
MCHC: 33.7 g/dL (ref 30.0–36.0)
MCV: 98.3 fL (ref 80.0–100.0)
Platelets: 274 10*3/uL (ref 150–400)
RBC: 2.99 MIL/uL — ABNORMAL LOW (ref 3.87–5.11)
RDW: 12.3 % (ref 11.5–15.5)
WBC: 10.8 10*3/uL — ABNORMAL HIGH (ref 4.0–10.5)
nRBC: 0 % (ref 0.0–0.2)

## 2019-09-30 LAB — HEMOGLOBIN A1C
Hgb A1c MFr Bld: 5.2 % (ref 4.8–5.6)
Mean Plasma Glucose: 103 mg/dL

## 2019-09-30 MED ORDER — HYDROXYZINE HCL 25 MG PO TABS
25.0000 mg | ORAL_TABLET | Freq: Four times a day (QID) | ORAL | Status: DC | PRN
  Administered 2019-09-30 (×2): 25 mg via ORAL
  Filled 2019-09-30 (×2): qty 1

## 2019-09-30 MED ORDER — QUETIAPINE FUMARATE 100 MG PO TABS
100.0000 mg | ORAL_TABLET | Freq: Every day | ORAL | Status: DC
  Administered 2019-09-30: 100 mg via ORAL
  Filled 2019-09-30: qty 1

## 2019-09-30 MED ORDER — MORPHINE SULFATE (PF) 2 MG/ML IV SOLN: 1.0000 mg | Freq: Three times a day (TID) | INTRAVENOUS | Status: DC | PRN

## 2019-09-30 MED ORDER — BISACODYL 10 MG RE SUPP: 10.0000 mg | Freq: Once | RECTAL | Status: DC

## 2019-09-30 NOTE — Progress Notes (Signed)
Patient does not want to go to another facility.  She says she has kids and bills. Will talk in huddle about her plans for behavioral health.

## 2019-09-30 NOTE — Progress Notes (Signed)
Behavioral health is going to evaluate her and may have her placed this week or next for rehab.

## 2019-09-30 NOTE — Progress Notes (Addendum)
Physical Therapy Treatment Patient Details Name: [name] MRN: [id number] DOB: 06-20-1971 Today's Date: 09/30/2019    History of Present Illness Patient is a 49 y/o female admitted after fall from 2nd story about 53' (possible suicide attempt).  Admitted with R olecranon open injury s/p ORIF and kidney injury and liver laceration.  She was on bedrest for laceration until today.    PT Comments    Patient progressing with confidence with ambulation and HR max 116 with ambulation today.  She continues with decreased safety awareness and cues for watching R arm near wall/doorways.  She will benefit from continued skilled PT but likely not to need follow up PT at inpatient psychiatric unit.    Follow Up Recommendations  No PT follow up(inpatient psychiatric)     Equipment Recommendations  None recommended by PT    Recommendations for Other Services       Precautions / Restrictions Precautions Precautions: Fall Precaution Comments: splint R UE    Mobility  Bed Mobility           Sit to supine: Modified independent (Device/Increase time)      Transfers     Transfers: Sit to/from Stand Sit to Stand: Supervision         General transfer comment: assist for safety  Ambulation/Gait Ambulation/Gait assistance: Supervision;Min guard Gait Distance (Feet): 400 Feet   Gait Pattern/deviations: Step-through pattern;Shuffle;Decreased stride length     General Gait Details: HR max 115 today and ambulating with improved confidence by end of session, cues and assist for safety at times close to wall on R concern for hitting splint/arm on the wall   Stairs             Wheelchair Mobility    Modified Rankin (Stroke Patients Only)       Balance Overall balance assessment: Needs assistance   Sitting balance-Leahy Scale: Good       Standing balance-Leahy Scale: Good                 High Level Balance Comments: in room walking in small space to  collect trash items            Cognition Arousal/Alertness: Awake/alert Behavior During Therapy: Anxious Overall Cognitive Status: Impaired/Different from baseline                     Current Attention Level: Sustained     Safety/Judgement: Decreased awareness of safety;Decreased awareness of deficits            Exercises      General Comments        Pertinent Vitals/Pain Pain Assessment: Faces Faces Pain Scale: Hurts little more Pain Location: R arm and ribs on R when coughing Pain Descriptors / Indicators: Discomfort;Guarding Pain Intervention(s): Monitored during session;Repositioned    Home Living                      Prior Function            PT Goals (current goals can now be found in the care plan section) Progress towards PT goals: Progressing toward goals    Frequency    Min 4X/week      PT Plan Current plan remains appropriate    Co-evaluation              AM-PAC PT "6 Clicks" Mobility   Outcome Measure  Help needed turning from your back to your side while in a  flat bed without using bedrails?: None Help needed moving from lying on your back to sitting on the side of a flat bed without using bedrails?: None Help needed moving to and from a bed to a chair (including a wheelchair)?: None Help needed standing up from a chair using your arms (e.g., wheelchair or bedside chair)?: None Help needed to walk in hospital room?: A Little Help needed climbing 3-5 steps with a railing? : A Little 6 Click Score: 22    End of Session   Activity Tolerance: Patient tolerated treatment well Patient left: in bed;with call bell/phone within reach   PT Visit Diagnosis: Other abnormalities of gait and mobility (R26.89);Pain Pain - Right/Left: Right Pain - part of body: Arm     Time: 8406-9861 PT Time Calculation (min) (ACUTE ONLY): 18 min  Charges:  $Gait Training: 8-22 mins                     Sheran Lawless, [place] Acute  Rehabilitation Services [phone] 09/30/2019    [name] 09/30/2019, 5:38 PM

## 2019-09-30 NOTE — Discharge Instructions (Signed)
For right upper extremity: - Keep splint in place, clean and dry - Elevate right upper extremity - Encourage digit range of motion   Liver Laceration  A liver laceration is a tear or a cut in the liver. The liver is an organ that is involved in many important bodily functions. Sometimes, a liver laceration can be a very serious injury. It can cause a lot of bleeding, and surgery may be needed. Other times, a liver laceration may be minor, and bed rest may be all that is needed. Either way, treatment in a hospital is almost always required. Liver lacerations are categorized in grades from 1 to 5. Low numbers identify lacerations that are less severe than lacerations with high numbers.  Grade 1: This is a tear in the outer lining of the liver. It is less than  inch (1 cm) deep.  Grade 2: This is a tear that is about  inch to 1 inch (1 to 3 cm) deep. It is less than 4 inches (10 cm) long.  Grade 3: This is a tear that is slightly more than 1 inch (3 cm) deep.  Grades 4 and 5: These lacerations are very deep. They affect a large part of the liver. What are the causes? This condition may be caused by:  A forceful hit to the area around the liver (blunt trauma), such as in a car crash. Blunt trauma can tear the liver even though it does not break the skin.  An injury in which an object goes through the skin and into the liver (penetrating injury), such as a stab or gunshot wound. What are the signs or symptoms? Common symptoms of this condition include:  A swollen and firm abdomen.  Pain in the abdomen.  Tenderness when pressing on the right side of the abdomen. Other symptoms include:  Bleeding from a penetrating wound.  Bruises on the abdomen.  A fast heartbeat.  Taking quick breaths.  Feeling weak and dizzy. How is this diagnosed? To diagnose this condition, your health care provider will do a physical exam and ask about any injuries to the right side of your abdomen. You  may have various tests, such as:  Blood tests. Your blood may be tested every few hours. This will show whether you are losing blood.  CT scan. This test is done to check for laceration or bleeding.  Laparoscopy. This involves placing a small camera into the abdomen and looking directly at the surface of the liver. How is this treated? Treatment depends on how deep the laceration is and how much bleeding you have. Treatment options include:  Monitoring and bed rest at the hospital. You will have tests often.  Receiving donated blood through an IV tube (transfusion) to replace blood that you have lost. You may need several transfusions.  Surgery to pack gauze pads or special material around the laceration to help it heal or to repair the laceration. Follow these instructions at home:  Take over-the-counter and prescription medicines only as told by your health care provider. Do not take any other medicines unless you ask your health care provider about them first.  Do not drive or use heavy machinery while taking prescription pain medicines.  Rest and limit your activity as told by your health care provider. It may be several months before you can return to your usual routine. Do not participate in activities that involve physical contact or require extra energy until your health care provider approves.  Keep all  follow-up visits as told by your health care provider. This is important. Contact a health care provider if:  Your abdominal pain does not go away.  You feel more weak and tired than usual. Get help right away if:  Your abdominal pain gets worse.  You have a cut on your skin that: ? Has more redness, swelling, or pain around it. ? Has more fluid or blood coming from it. ? Feels warm to the touch. ? Has pus or a bad smell coming from it.  You feel dizzy or very weak.  You have trouble breathing.  You have a fever. This information is not intended to replace advice  given to you by your health care provider. Make sure you discuss any questions you have with your health care provider. Document Revised: 07/18/2017 Document Reviewed: 03/22/2016 Elsevier Patient Education  2020 [employer].

## 2019-09-30 NOTE — TOC Progression Note (Signed)
Transition of Care [facility] - Progression Note    Patient Details  Name: [name] MRN: [id number] Date of Birth: 05-04-1971  Transition of Care [facility]) CM/SW Contact  Glennon Mac, RN Phone Number: 09/30/2019, 9:47 AM  Clinical Narrative:  Notified Jasmine at [facility] that pt is medically stable for psych admission.  Await bed availability.      Expected Discharge Plan: Psychiatric Hospital Barriers to Discharge: Continued Medical Work up  Expected Discharge Plan and Services Expected Discharge Plan: Psychiatric Hospital   Discharge Planning Services: CM Consult   Living arrangements for the past 2 months: Single Family Home                                       Social Determinants of Health (SDOH) Interventions    Readmission Risk Interventions No flowsheet data found.  Quintella Baton, RN, BSN  Trauma/Neuro ICU Case Manager [phone]

## 2019-09-30 NOTE — Progress Notes (Signed)
I was giving patient lovenox in the stomach and she knocked my hand away so I only gave her half of the dose.

## 2019-09-30 NOTE — Progress Notes (Signed)
[facility] Progress Note  5 Days Post-Op  Subjective: CC-  Patient upset this morning. States that she wants to go home. Needs to be with her son and get back to work to pay her bills. States that she is no longer suicidal. Denies CP or SOB. Coughing up phlegm. O2 sats stable on room air.  Continues to have some RUQ pain. Denies n/v. Tolerating diet. Patient states that she has not had a BM since admission, but 2 BMs recorded for yesterday.  ROS: See above, otherwise other systems negative   Objective: Vital signs in last 24 hours: Temp:  [97.7 F (36.5 C)-99.7 F (37.6 C)] 98.8 F (37.1 C) (02/11 0755) Pulse Rate:  [102-131] 121 (02/11 0755) Resp:  [18-27] 21 (02/11 0755) BP: (118-141)/(58-83) 118/68 (02/11 0755) SpO2:  [93 %-100 %] 100 % (02/11 0755) Last BM Date: 09/29/19  Intake/Output from previous day: 02/10 0701 - 02/11 0700 In: 1061 [P.O.:1061] Out: -  Intake/Output this shift: Total I/O In: 240 [P.O.:240] Out: -   PE: ZOX:WRUEA, NAD HEENT: EOM's intact, pupils equal and round, forehead laceration without erythema or drainage Card: tachy ~110, no M/G/R heard, 2+ DP pulses Pulm:CTAB, no wheezing or rhonchi, rate and effort normal, O2 sats stable on RA, pulling 800 on IS Abd: Soft, ND, mild RUQ TTP, +BS, no HSM, no hernia Ext: no BLE edema, calves soft and nontender. Splint to RUE, fingers WWP with good cap refill, able to wiggle fingers Psych: A&Ox4 Skin: no rashes noted, warm and dry  Lab Results:  Recent Labs    09/29/19 0450 09/30/19 0402  WBC 12.5* 10.8*  HGB 9.2* 9.9*  HCT 28.0* 29.4*  PLT 247 274   BMET Recent Labs    09/29/19 0450 09/30/19 0402  NA 133* 135  K 3.8 4.3  CL 95* 97*  CO2 27 27  GLUCOSE 198* 133*  BUN 5* 5*  CREATININE 0.65 0.70  CALCIUM 8.6* 8.9   PT/INR No results for input(s): LABPROT, INR in the last 72 hours. CMP     Component Value Date/Time   NA 135 09/30/2019 0402   K 4.3 09/30/2019 0402   CL 97 (L) 09/30/2019 0402   CO2 27 09/30/2019 0402   GLUCOSE 133 (H) 09/30/2019 0402   BUN 5 (L) 09/30/2019 0402   CREATININE 0.70 09/30/2019 0402   CALCIUM 8.9 09/30/2019 0402   PROT 5.7 (L) 09/26/2019 0218   ALBUMIN 3.2 (L) 09/26/2019 0218   AST 121 (H) 09/26/2019 0218   ALT 195 (H) 09/26/2019 0218   ALKPHOS 118 09/26/2019 0218   BILITOT 1.0 09/26/2019 0218   GFRNONAA >60 09/30/2019 0402   GFRAA >60 09/30/2019 0402   Lipase  No results found for: LIPASE     Studies/Results: DG CHEST PORT 1 VIEW  Result Date: 09/28/2019 CLINICAL DATA:  Shortness of breath. Follow-up right lung atelectasis. Status post 2 story fall. EXAM: PORTABLE CHEST 1 VIEW COMPARISON:  09/25/2019 FINDINGS: Improved aeration of the right upper lobe. Interval right basilar airspace opacity and small to moderate-sized right pleural effusion. Clear left lung. Stable old, healed left rib fractures. IMPRESSION: 1. Improved right upper lobe atelectasis. 2. Interval right basilar atelectasis, pneumonia or aspiration pneumonitis and small to moderate-sized right pleural effusion. Electronically Signed   By: Beckie Salts M.D.   On: 09/28/2019 10:07    Anti-infectives: Anti-infectives (From admission, onward)   Start     Dose/Rate Route Frequency Ordered Stop   09/27/19 0930  ceFAZolin (ANCEF) IVPB  1 g/50 mL premix     1 g 100 mL/hr over 30 Minutes Intravenous Every 8 hours 09/27/19 0916 09/28/19 0347   09/25/19 1400  ceFAZolin (ANCEF) IVPB 2g/100 mL premix     2 g 200 mL/hr over 30 Minutes Intravenous Every 8 hours 09/25/19 1016 09/26/19 1702       Assessment/Plan Fallvsjump from second floor Suicidal ideation- continue suicide precautions/sitter, psychrecommending IP psych admission. IVC 2/9 Grade 2-3Liver laceration-d/cbedrest 2/9. Hgb stable Right renal artery injury- s/p angiogram 2/6 Dr. Derrill Center flow limiting dissection, continue ASA, no follow up needed ABL anemia- 2/2 above. Hg9.9 from 9.2,  stable. Continue iron and vitamin C supplementation  Open olecranonfx- s/p ORIF 2/6 Dr. Jeannie Fend. NWB RUE, splint and elevate, encourage digit ROM. Follow up 10-14 days postop SOB/wheezing - improved. Nebs PRN. continuemucinex. O2 sats stable on room air Alcohol in toxication- CIWA Substance abuse (cocaine + upon admission)- SBIRT Bipolar disorder- home seroquel Asthma/COPD Tobacco abuse - nicotine patch Elevated glucose - A1c 5.2  ID -ancef 2/6>>2/9 for open fx FEN -KVOIVF, reg diet VTE -SCDs,  lovenox Foley -none Follow up- ortho, trauma, psych  Plan- Dulcolax suppository.Continue therapies. Ultimately will require IP psych admission, we again discussed the reasoning for her needing inpatient psychiatric admission. Patient is medically stable for discharge when bed available.    LOS: 5 days    [facility] 09/30/2019, 8:51 AM Please see Amion for pager number during day hours 7:00am-4:30pm

## 2019-10-01 ENCOUNTER — Other Ambulatory Visit: Payer: Self-pay

## 2019-10-01 ENCOUNTER — Inpatient Hospital Stay
Admission: AD | Admit: 2019-10-01 | Discharge: 2019-10-02 | DRG: 885 | Disposition: A | Discharge status: Home / Self Care | Payer: No Typology Code available for payment source | Source: Intra-hospital | Attending: Psychiatry | Admitting: Psychiatry

## 2019-10-01 ENCOUNTER — Encounter: Payer: Self-pay | Admitting: Psychiatry

## 2019-10-01 DIAGNOSIS — X80XXXA Intentional self-harm by jumping from a high place, initial encounter: Secondary | ICD-10-CM | POA: Diagnosis present

## 2019-10-01 DIAGNOSIS — F1490 Cocaine use, unspecified, uncomplicated: Secondary | ICD-10-CM | POA: Diagnosis present

## 2019-10-01 DIAGNOSIS — F17210 Nicotine dependence, cigarettes, uncomplicated: Secondary | ICD-10-CM | POA: Diagnosis present

## 2019-10-01 DIAGNOSIS — G4700 Insomnia, unspecified: Secondary | ICD-10-CM | POA: Diagnosis present

## 2019-10-01 DIAGNOSIS — J441 Chronic obstructive pulmonary disease with (acute) exacerbation: Secondary | ICD-10-CM | POA: Diagnosis present

## 2019-10-01 DIAGNOSIS — F1011 Alcohol abuse, in remission: Secondary | ICD-10-CM | POA: Diagnosis present

## 2019-10-01 DIAGNOSIS — Z915 Personal history of self-harm: Secondary | ICD-10-CM | POA: Diagnosis not present

## 2019-10-01 DIAGNOSIS — F1410 Cocaine abuse, uncomplicated: Secondary | ICD-10-CM | POA: Diagnosis present

## 2019-10-01 DIAGNOSIS — F332 Major depressive disorder, recurrent severe without psychotic features: Principal | ICD-10-CM | POA: Diagnosis present

## 2019-10-01 DIAGNOSIS — Z91040 Latex allergy status: Secondary | ICD-10-CM

## 2019-10-01 DIAGNOSIS — I10 Essential (primary) hypertension: Secondary | ICD-10-CM | POA: Diagnosis present

## 2019-10-01 DIAGNOSIS — F419 Anxiety disorder, unspecified: Secondary | ICD-10-CM | POA: Diagnosis present

## 2019-10-01 DIAGNOSIS — Z79899 Other long term (current) drug therapy: Secondary | ICD-10-CM

## 2019-10-01 DIAGNOSIS — Z880 Allergy status to penicillin: Secondary | ICD-10-CM | POA: Diagnosis not present

## 2019-10-01 LAB — SARS CORONAVIRUS 2 (TAT 6-24 HRS): SARS Coronavirus 2: NEGATIVE

## 2019-10-01 MED ORDER — ASCORBIC ACID 500 MG PO TABS: 500.0000 mg | ORAL_TABLET | Freq: Every day | ORAL | Status: AC

## 2019-10-01 MED ORDER — DOCUSATE SODIUM 100 MG PO CAPS: 100.0000 mg | ORAL_CAPSULE | Freq: Two times a day (BID) | ORAL | 0 refills | Status: AC

## 2019-10-01 MED ORDER — ADULT MULTIVITAMIN W/MINERALS CH: 1.0000 | ORAL_TABLET | Freq: Every day | ORAL | Status: AC

## 2019-10-01 MED ORDER — QUETIAPINE FUMARATE 100 MG PO TABS
100.0000 mg | ORAL_TABLET | Freq: Every day | ORAL | Status: DC
  Administered 2019-10-01: 100 mg via ORAL
  Filled 2019-10-01: qty 1

## 2019-10-01 MED ORDER — DIPHENHYDRAMINE HCL 25 MG PO CAPS
25.0000 mg | ORAL_CAPSULE | Freq: Once | ORAL | Status: AC
  Administered 2019-10-01: 25 mg via ORAL
  Filled 2019-10-01: qty 1

## 2019-10-01 MED ORDER — PANTOPRAZOLE SODIUM 40 MG PO TBEC
40.0000 mg | DELAYED_RELEASE_TABLET | Freq: Every day | ORAL | Status: DC
  Administered 2019-10-02: 40 mg via ORAL
  Filled 2019-10-01: qty 1

## 2019-10-01 MED ORDER — NICOTINE 21 MG/24HR TD PT24
21.0000 mg | MEDICATED_PATCH | Freq: Every day | TRANSDERMAL | Status: DC
  Administered 2019-10-02: 21 mg via TRANSDERMAL
  Filled 2019-10-01: qty 1

## 2019-10-01 MED ORDER — FOLIC ACID 1 MG PO TABS: 1.0000 mg | ORAL_TABLET | Freq: Every day | ORAL | Status: AC

## 2019-10-01 MED ORDER — LISINOPRIL 20 MG PO TABS
20.0000 mg | ORAL_TABLET | Freq: Every day | ORAL | Status: DC
  Administered 2019-10-02: 20 mg via ORAL
  Filled 2019-10-01: qty 1

## 2019-10-01 MED ORDER — POLYETHYLENE GLYCOL 3350 17 G PO PACK: 17.0000 g | PACK | Freq: Every day | ORAL | 0 refills | Status: AC

## 2019-10-01 MED ORDER — METHOCARBAMOL 500 MG PO TABS: 1000.0000 mg | ORAL_TABLET | Freq: Three times a day (TID) | ORAL | Status: AC | PRN

## 2019-10-01 MED ORDER — GUAIFENESIN ER 600 MG PO TB12: 600.0000 mg | ORAL_TABLET | Freq: Two times a day (BID) | ORAL | Status: AC

## 2019-10-01 MED ORDER — ALUM & MAG HYDROXIDE-SIMETH 200-200-20 MG/5ML PO SUSP: 30.0000 mL | ORAL | Status: DC | PRN

## 2019-10-01 MED ORDER — QUETIAPINE FUMARATE 100 MG PO TABS: 100.0000 mg | ORAL_TABLET | Freq: Every day | ORAL | Status: DC

## 2019-10-01 MED ORDER — ALBUTEROL SULFATE HFA 108 (90 BASE) MCG/ACT IN AERS
2.0000 | INHALATION_SPRAY | Freq: Four times a day (QID) | RESPIRATORY_TRACT | Status: DC | PRN
  Administered 2019-10-02: 2 via RESPIRATORY_TRACT
  Filled 2019-10-01: qty 6.7

## 2019-10-01 MED ORDER — FERROUS SULFATE 325 (65 FE) MG PO TABS: 325.0000 mg | ORAL_TABLET | Freq: Two times a day (BID) | ORAL | 3 refills | Status: AC

## 2019-10-01 MED ORDER — OXYCODONE-ACETAMINOPHEN 5-325 MG PO TABS
1.0000 | ORAL_TABLET | Freq: Four times a day (QID) | ORAL | Status: DC | PRN
  Administered 2019-10-01 – 2019-10-02 (×3): 2 via ORAL
  Filled 2019-10-01 (×3): qty 2

## 2019-10-01 MED ORDER — BISACODYL 10 MG RE SUPP: 10.0000 mg | Freq: Every day | RECTAL | 0 refills | Status: AC | PRN

## 2019-10-01 MED ORDER — DOCUSATE SODIUM 100 MG PO CAPS
100.0000 mg | ORAL_CAPSULE | Freq: Two times a day (BID) | ORAL | Status: DC
  Administered 2019-10-02: 100 mg via ORAL
  Filled 2019-10-01: qty 1

## 2019-10-01 MED ORDER — THIAMINE HCL 100 MG PO TABS: 100.0000 mg | ORAL_TABLET | Freq: Every day | ORAL | Status: AC

## 2019-10-01 MED ORDER — MAGNESIUM HYDROXIDE 400 MG/5ML PO SUSP: 30.0000 mL | Freq: Every day | ORAL | Status: DC | PRN

## 2019-10-01 MED ORDER — OXYCODONE HCL 5 MG PO TABS: 5.0000 mg | ORAL_TABLET | ORAL | 0 refills | Status: AC | PRN

## 2019-10-01 MED ORDER — ACETAMINOPHEN 325 MG PO TABS
650.0000 mg | ORAL_TABLET | Freq: Four times a day (QID) | ORAL | Status: DC | PRN
  Administered 2019-10-01: 650 mg via ORAL
  Filled 2019-10-01 (×2): qty 2

## 2019-10-01 NOTE — Progress Notes (Signed)
Occupational Therapy Treatment Patient Details Name: [name] MRN: [id number] DOB: 12-12-1970 Today's Date: 10/01/2019    History of present illness Patient is a 49 y/o female admitted after fall from 2nd story about 73' (possible suicide attempt).  Admitted with R olecranon open injury s/p ORIF and kidney injury and liver laceration.  She was on bedrest for laceration until today.   OT comments  Pt seen for OT f/u with focus on cognition and safety. Pt able to don B socks with date, cueing not to pull through RUE to maintain precautions. Pt then completed toilet transfer at supervision level for safety. Functional mobility completed in the hallway with focus on trail making, memory, attention. Attempted to have pt remember a word while engaging in conversation to begin to divide attention. She continues to struggle with this. Her attention was improved to be able to focus at task on hand while ignoring appropriate distractions. Today she mentions "the devil inside of her taking over and making her talk to others poorly". D/c recs remain appropriate. Will continue to follow.   Follow Up Recommendations  Other (comment)([facility] admission, continue OT services there)    Equipment Recommendations  3 in 1 bedside commode    Recommendations for Other Services      Precautions / Restrictions Precautions Precautions: Fall Precaution Comments: splint R UE Restrictions Weight Bearing Restrictions: Yes RUE Weight Bearing: Non weight bearing       Mobility Bed Mobility           Sit to supine: Modified independent (Device/Increase time)      Transfers Overall transfer level: Needs assistance     Sit to Stand: Supervision Stand pivot transfers: Supervision       General transfer comment: assist for safety    Balance Overall balance assessment: Mild deficits observed, not formally tested                                         ADL either performed or  assessed with clinical judgement   ADL Overall ADL's : Needs assistance/impaired                         Toilet Transfer: Min guard;Cueing for Administrator, sports Details (indicate cue type and reason): cueing for safety due to impulsiveness         Functional mobility during ADLs: Min guard;Cueing for safety General ADL Comments: pt limited by cognitive deficits that impact safety for BADLs as well as R elbow precautions and pain     Vision Patient Visual Report: No change from baseline     Perception     Praxis      Cognition Arousal/Alertness: Awake/alert Behavior During Therapy: Flat affect;Impulsive Overall Cognitive Status: Impaired/Different from baseline Area of Impairment: Attention;Safety/judgement                   Current Attention Level: Sustained     Safety/Judgement: Decreased awareness of safety;Decreased awareness of deficits     General Comments: continues to present with poor attention and safety awareness as well as higher level deficits        Exercises     Shoulder Instructions       General Comments      Pertinent Vitals/ Pain       Pain Assessment: Faces Faces Pain Scale: Hurts little more  Pain Location: R arm and ribs on R when coughing Pain Descriptors / Indicators: Discomfort;Guarding Pain Intervention(s): Monitored during session  Home Living                                          Prior Functioning/Environment              Frequency  Min 2X/week        Progress Toward Goals  OT Goals(current goals can now be found in the care plan section)  Progress towards OT goals: Progressing toward goals  Acute Rehab OT Goals Patient Stated Goal: go to [place] and get help OT Goal Formulation: With patient Time For Goal Achievement: 10/13/19 Potential to Achieve Goals: Good  Plan Discharge plan remains appropriate    Co-evaluation                 AM-PAC  OT "6 Clicks" Daily Activity     Outcome Measure   Help from another person eating meals?: A Little Help from another person taking care of personal grooming?: A Little Help from another person toileting, which includes using toliet, bedpan, or urinal?: A Little Help from another person bathing (including washing, rinsing, drying)?: A Lot Help from another person to put on and taking off regular upper body clothing?: A Lot Help from another person to put on and taking off regular lower body clothing?: A Little 6 Click Score: 16    End of Session    OT Visit Diagnosis: Unsteadiness on feet (R26.81);Other abnormalities of gait and mobility (R26.89);Other symptoms and signs involving cognitive function;Pain Pain - Right/Left: Right Pain - part of body: Arm   Activity Tolerance Patient tolerated treatment well   Patient Left in bed;with call bell/phone within reach   Nurse Communication Mobility status;Precautions;Weight bearing status        Time: 4287-6811 OT Time Calculation (min): 18 min  Charges: OT General Charges $OT Visit: 1 Visit OT Treatments $Cognitive Funtion inital: Initial 15 mins  Dalphine Handing, MSOT, OTR/L Acute Rehabilitation Services [facility] Office Number: [phone]  Dalphine Handing 10/01/2019, 1:31 PM

## 2019-10-01 NOTE — Care Management (Signed)
Called for non-emergent law enforcement transport to [facility].    Quintella Baton, RN, BSN  Trauma/Neuro ICU Case Manager [phone]

## 2019-10-01 NOTE — Progress Notes (Addendum)
Pt is accepted to [facility]. Dr. Cindi Carbon is the accepting/attending provider Call report to [phone] Pt's RN at [facility] hospital notified. Pt is under IVC and will be transported by Patent examiner.  Pt is scheduled to arrive at 1pm.   Wells Guiles, LCSW, LCAS Disposition CSW [facility]/TTS [phone]

## 2019-10-01 NOTE — [facility] Suicide Risk Assessment (Signed)
[facility] Admission Suicide Risk Assessment   Nursing information obtained from:    Demographic factors:    Current Mental Status:    Loss Factors:    Historical Factors:    Risk Reduction Factors:     Total Time spent with patient: 1 hour Principal Problem: MDD (major depressive disorder), recurrent severe, without psychosis (HCC) Diagnosis:  Principal Problem:   MDD (major depressive disorder), recurrent severe, without psychosis (HCC) Active Problems:   Cocaine abuse (HCC)   Suicide and self-inflicted injuries by jumping from high place [facility])   Essential hypertension   COPD exacerbation (HCC)  Subjective Data: Patient seen chart reviewed.  49 year old woman transferred to [place] from [facility] after being stabilized from multiple injuries from falling from a height.  Patient says she does not remember whether she intentionally jumped or fell accidentally.  She is currently denying suicidal ideation.  She is denying other acute symptoms of depression focused instead on her acute physical problems.  Not evidently psychotic or delirious.  Continued Clinical Symptoms:    The "Alcohol Use Disorders Identification Test", Guidelines for Use in Primary Care, Second Edition.  World Science writer [facility]). Score between 0-7:  no or low risk or alcohol related problems. Score between 8-15:  moderate risk of alcohol related problems. Score between 16-19:  high risk of alcohol related problems. Score 20 or above:  warrants further diagnostic evaluation for alcohol dependence and treatment.   CLINICAL FACTORS:   Depression:   Comorbid alcohol abuse/dependence   Musculoskeletal: Strength & Muscle Tone: within normal limits Gait & Station: normal Patient leans: N/A  Psychiatric Specialty Exam: Physical Exam  Nursing note and vitals reviewed. Constitutional: She appears well-developed and well-nourished.  HENT:  Head: Normocephalic and atraumatic.  Eyes: Pupils are equal, round, and  reactive to light. Conjunctivae are normal.  Cardiovascular: Normal heart sounds.  Respiratory: Effort normal.  GI: Soft.  Musculoskeletal:        General: Normal range of motion.       Arms:     Cervical back: Normal range of motion.  Neurological: She is alert.  Skin: Skin is warm and dry.     Psychiatric: Her speech is normal and behavior is normal. Judgment normal. Her affect is blunt. Thought content is not paranoid. She expresses no homicidal and no suicidal ideation. She exhibits abnormal recent memory.    Review of Systems  Constitutional: Negative.   HENT: Negative.   Eyes: Negative.   Respiratory: Negative.   Cardiovascular: Negative.   Gastrointestinal: Negative.   Musculoskeletal: Positive for arthralgias and myalgias.  Skin: Negative.   Neurological: Negative.   Psychiatric/Behavioral: Positive for sleep disturbance.    There were no vitals taken for this visit.There is no height or weight on file to calculate BMI.  General Appearance: Disheveled  Eye Contact:  Minimal  Speech:  Slow  Volume:  Decreased  Mood:  Dysphoric  Affect:  Congruent  Thought Process:  Goal Directed  Orientation:  Full (Time, Place, and Person)  Thought Content:  Logical  Suicidal Thoughts:  No  Homicidal Thoughts:  No  Memory:  Immediate;   Fair Recent;   Fair Remote;   Fair  Judgement:  Fair  Insight:  Fair  Psychomotor Activity:  Normal  Concentration:  Concentration: Fair  Recall:  [employer] of Knowledge:  Fair  Language:  Fair  Akathisia:  No  Handed:  Right  AIMS (if indicated):     Assets:  Desire for Improvement  ADL's:  Impaired  Cognition:  Impaired,  Mild  Sleep:         COGNITIVE FEATURES THAT CONTRIBUTE TO RISK:  None    SUICIDE RISK:   Minimal: No identifiable suicidal ideation.  Patients presenting with no risk factors but with morbid ruminations; may be classified as minimal risk based on the severity of the depressive symptoms  PLAN OF CARE:  15-minute checks.  Medical stabilization.  Reassessment by treatment team and providers over the next couple days to reassess need for psychiatric treatment.  Reassess suicidal ideation prior to discharge planning  I certify that inpatient services furnished can reasonably be expected to improve the patient's condition.   Alethia Berthold, MD 10/01/2019, 5:07 PM
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
## 2019-10-02 DIAGNOSIS — F332 Major depressive disorder, recurrent severe without psychotic features: Secondary | ICD-10-CM

## 2019-10-02 MED ORDER — TRAZODONE HCL 50 MG PO TABS: 50.0000 mg | ORAL_TABLET | Freq: Every evening | ORAL | 0 refills | Status: AC | PRN

## 2019-10-02 MED ORDER — QUETIAPINE FUMARATE 50 MG PO TABS: 50.0000 mg | ORAL_TABLET | Freq: Three times a day (TID) | ORAL | 0 refills | Status: AC

## 2019-10-02 MED ORDER — DIVALPROEX SODIUM 125 MG PO CSDR: 250.0000 mg | DELAYED_RELEASE_CAPSULE | Freq: Two times a day (BID) | ORAL | 2 refills | Status: AC

## 2019-10-02 NOTE — Plan of Care (Signed)
D: Pt during assessments this morning denies si/hi/avh, able to verbalize that she feels safe on the unit and safe to discharge. Pt. Endorses a mostly normal mood, states that she is, "fine". Pt. Endorses pain from injuries prior to admissions, patient given PRN medicine for comfort. Pt. Alert and oriented x4.  A: Q x 15 minute observation checks in place/maintained for safety until pending discharge is complete. Pt. Given education and patient information when appropriate and able. Pt. Charts reviewed. Pt. Given support and encouragement when able and appropriate.    R: Patient is complaint with medication and unit procedures thus far. Pt. Informed of information pertaining to discharge as discharge processing Is being completed. Will continue to process discharge at this time.    Problem: Education: Goal: Knowledge of [facility] Education information/materials will improve Outcome: Adequate for Discharge Goal: Emotional status will improve Outcome: Adequate for Discharge Goal: Mental status will improve Outcome: Adequate for Discharge Goal: Verbalization of understanding the information provided will improve Outcome: Adequate for Discharge   Problem: Activity: Goal: Interest or engagement in activities will improve Outcome: Adequate for Discharge Goal: Sleeping patterns will improve Outcome: Adequate for Discharge   Problem: Coping: Goal: Ability to verbalize frustrations and anger appropriately will improve Outcome: Adequate for Discharge Goal: Ability to demonstrate self-control will improve Outcome: Adequate for Discharge   Problem: Health Behavior/Discharge Planning: Goal: Identification of resources available to assist in meeting health care needs will improve Outcome: Adequate for Discharge Goal: Compliance with treatment plan for underlying cause of condition will improve Outcome: Adequate for Discharge   Problem: Physical Regulation: Goal: Ability to maintain  clinical measurements within normal limits will improve Outcome: Adequate for Discharge   Problem: Safety: Goal: Periods of time without injury will increase Outcome: Adequate for Discharge

## 2019-10-02 NOTE — Tx Team (Addendum)
Interdisciplinary Treatment and Diagnostic Plan Update  10/02/2019 Time of Session: 8:30am [name] MRN: [id number]  Principal Diagnosis: MDD (major depressive disorder), recurrent severe, without psychosis ([place])  Secondary Diagnoses: Principal Problem:   MDD (major depressive disorder), recurrent severe, without psychosis ([place]) Active Problems:   Cocaine abuse ([place])   Suicide and self-inflicted injuries by jumping from high place [facility])   Essential hypertension   COPD exacerbation ([place])   Current Medications:  Current Facility-Administered Medications  Medication Dose Route Frequency Provider Last Rate Last Admin  . acetaminophen (TYLENOL) tablet 650 mg  650 mg Oral Q6H PRN Money, Lowry Ram, FNP   650 mg at 10/01/19 1921  . albuterol (VENTOLIN HFA) 108 (90 Base) MCG/ACT inhaler 2 puff  2 puff Inhalation Q6H PRN Clapacs, Madie Reno, MD   2 puff at 10/02/19 0804  . alum & mag hydroxide-simeth (MAALOX/MYLANTA) 200-200-20 MG/5ML suspension 30 mL  30 mL Oral Q4H PRN Money, Lowry Ram, FNP      . docusate sodium (COLACE) capsule 100 mg  100 mg Oral BID Clapacs, Madie Reno, MD   100 mg at 10/02/19 0740  . lisinopril (ZESTRIL) tablet 20 mg  20 mg Oral Daily Clapacs, Madie Reno, MD   20 mg at 10/02/19 0738  . magnesium hydroxide (MILK OF MAGNESIA) suspension 30 mL  30 mL Oral Daily PRN Money, Darnelle Maffucci B, FNP      . nicotine (NICODERM CQ - dosed in mg/24 hours) patch 21 mg  21 mg Transdermal Daily Clapacs, Madie Reno, MD   21 mg at 10/02/19 0740  . oxyCODONE-acetaminophen (PERCOCET/ROXICET) 5-325 MG per tablet 1-2 tablet  1-2 tablet Oral Q6H PRN Clapacs, Madie Reno, MD   2 tablet at 10/02/19 0740  . pantoprazole (PROTONIX) EC tablet 40 mg  40 mg Oral Daily Clapacs, Madie Reno, MD   40 mg at 10/02/19 0740  . QUEtiapine (SEROQUEL) tablet 100 mg  100 mg Oral QHS Clapacs, Madie Reno, MD   100 mg at 10/01/19 2134   PTA Medications: Medications Prior to Admission  Medication Sig Dispense Refill Last Dose  .  acetaminophen-codeine (TYLENOL #3) 300-30 MG tablet Take 1 tablet by mouth every 4 (four) hours as needed for moderate pain. (Patient not taking: Reported on 03/07/2016) 10 tablet 0   . chlordiazePOXIDE (LIBRIUM) 5 MG capsule Take 1 capsule (5 mg total) by mouth 3 (three) times daily. (Patient not taking: Reported on 02/23/2016) 90 capsule 0   . nicotine (NICODERM CQ - DOSED IN MG/24 HOURS) 14 mg/24hr patch 14 mg patch daily 2 weeks then 7 mg patch daily 3 weeks and stop 28 patch 0   . pantoprazole (PROTONIX) 40 MG tablet Take 1 tablet (40 mg total) by mouth daily. 30 tablet 1   . saccharomyces boulardii (FLORASTOR) 250 MG capsule Take 1 capsule (250 mg total) by mouth 2 (two) times daily. 60 capsule 0   . traMADol (ULTRAM) 50 MG tablet Take 50 mg by mouth every 6 (six) hours as needed.     . [DISCONTINUED] divalproex (DEPAKOTE SPRINKLE) 125 MG capsule Take 4 capsules (500 mg total) by mouth every 8 (eight) hours. 960 capsule 2   . [DISCONTINUED] QUEtiapine (SEROQUEL) 50 MG tablet Take 1 tablet (50 mg total) by mouth 3 (three) times daily. 90 tablet 0   . [DISCONTINUED] traZODone (DESYREL) 50 MG tablet Take 1 tablet (50 mg total) by mouth at bedtime as needed for sleep. 30 tablet 0     Patient Stressors:  Patient Strengths:    Treatment Modalities: Medication Management, Group therapy, Case management,  1 to 1 session with clinician, Psychoeducation, Recreational therapy.   Physician Treatment Plan for Primary Diagnosis: MDD (major depressive disorder), recurrent severe, without psychosis (HCC) Long Term Goal(s): Improvement in symptoms so as ready for discharge Improvement in symptoms so as ready for discharge   Short Term Goals: Ability to demonstrate self-control will improve Compliance with prescribed medications will improve Ability to maintain clinical measurements within normal limits will improve  Medication Management: Evaluate patient's response, side effects, and tolerance of  medication regimen.  Therapeutic Interventions: 1 to 1 sessions, Unit Group sessions and Medication administration.  Evaluation of Outcomes: Progressing  Physician Treatment Plan for Secondary Diagnosis: Principal Problem:   MDD (major depressive disorder), recurrent severe, without psychosis (HCC) Active Problems:   Cocaine abuse (HCC)   Suicide and self-inflicted injuries by jumping from high place [facility])   Essential hypertension   COPD exacerbation (HCC)  Long Term Goal(s): Improvement in symptoms so as ready for discharge Improvement in symptoms so as ready for discharge   Short Term Goals: Ability to demonstrate self-control will improve Compliance with prescribed medications will improve Ability to maintain clinical measurements within normal limits will improve     Medication Management: Evaluate patient's response, side effects, and tolerance of medication regimen.  Therapeutic Interventions: 1 to 1 sessions, Unit Group sessions and Medication administration.  Evaluation of Outcomes: Progressing   RN Treatment Plan for Primary Diagnosis: MDD (major depressive disorder), recurrent severe, without psychosis (HCC) Long Term Goal(s): Knowledge of disease and therapeutic regimen to maintain health will improve  Short Term Goals: Ability to remain free from injury will improve, Ability to verbalize frustration and anger appropriately will improve, Ability to demonstrate self-control, Ability to participate in decision making will improve, Ability to verbalize feelings will improve, Ability to disclose and discuss suicidal ideas and Compliance with prescribed medications will improve  Medication Management: RN will administer medications as ordered by provider, will assess and evaluate patient's response and provide education to patient for prescribed medication. RN will report any adverse and/or side effects to prescribing provider.  Therapeutic Interventions: 1 on 1 counseling  sessions, Psychoeducation, Medication administration, Evaluate responses to treatment, Monitor vital signs and CBGs as ordered, Perform/monitor CIWA, COWS, AIMS and Fall Risk screenings as ordered, Perform wound care treatments as ordered.  Evaluation of Outcomes: Progressing   LCSW Treatment Plan for Primary Diagnosis: MDD (major depressive disorder), recurrent severe, without psychosis (HCC) Long Term Goal(s): Safe transition to appropriate next level of care at discharge, Engage patient in therapeutic group addressing interpersonal concerns.  Short Term Goals: Engage patient in aftercare planning with referrals and resources, Increase social support, Increase ability to appropriately verbalize feelings, Increase emotional regulation and Increase skills for wellness and recovery  Therapeutic Interventions: Assess for all discharge needs, 1 to 1 time with Social worker, Explore available resources and support systems, Assess for adequacy in community support network, Educate family and significant other(s) on suicide prevention, Complete Psychosocial Assessment, Interpersonal group therapy.  Evaluation of Outcomes: Progressing   Progress in Treatment: Attending groups: No. Admitted on 10/01/19 Participating in groups: No. Admitted on 10/01/19 Taking medication as prescribed: Yes. Toleration medication: Yes. Family/Significant other contact made: No, will contact:  Pt will identify a contact.  Patient understands diagnosis: Yes. Discussing patient identified problems/goals with staff: Yes. Medical problems stabilized or resolved: Yes. Denies suicidal/homicidal ideation: Yes. Issues/concerns per patient self-inventory: No. Other: None  New problem(s) identified:  No, Describe:  None  New Short Term/Long Term Goal(s):  Patient Goals:  "I never planned to hurt myself and I want to go home".   Discharge Plan or Barriers: Pt will be connected to and follow up with after care services upon  discharge.  Reason for Continuation of Hospitalization: Depression Medication stabilization  Estimated Length of Stay: 1-3 days  Attendees: Patient: [name] 10/02/2019   Physician: Dr. Jeannine Kitten 10/02/2019   Nursing:  10/02/2019   RN Care Manager: 10/02/2019   Social Worker: Morene Antu, LCSWA 10/02/2019   Recreational Therapist:  10/02/2019   Other:  10/02/2019   Other:  10/02/2019   Other: 10/02/2019    Scribe for Treatment Team: Jimmey Ralph, Theresia Majors 10/02/2019 8:55 AM

## 2019-10-02 NOTE — [facility] Suicide Risk Assessment (Signed)
[facility] INPATIENT:  Family/Significant Other Suicide Prevention Education  Suicide Prevention Education:  Education Completed; Rebekah Chesterfield, boyfriend  (name of family member/significant other) has been identified by the patient as the family member/significant other with whom the patient will be residing, and identified as the person(s) who will aid the patient in the event of a mental health crisis (suicidal ideations/suicide attempt).  With written consent from the patient, the family member/significant other has been provided the following suicide prevention education, prior to the and/or following the discharge of the patient.  The suicide prevention education provided includes the following:  Suicide risk factors  Suicide prevention and interventions  National Suicide Hotline telephone number  [facility] assessment telephone number  [facility] Emergency Assistance 911  [facility] and/or Residential Mobile Crisis Unit telephone number  Request made of family/significant other to:  Remove weapons (e.g., guns, rifles, knives), all items previously/currently identified as safety concern.    Remove drugs/medications (over-the-counter, prescriptions, illicit drugs), all items previously/currently identified as a safety concern.  The family member/significant other verbalizes understanding of the suicide prevention education information provided.  The family member/significant other agrees to remove the items of safety concern listed above.  CSW spoke with Rebekah Chesterfield, patient's boyfriend. CSW went over the details listed above. CSW asked if he had any follow up questions, he did not. CSW ended the call.   Teresita Madura, MSW, [employer] Clinical Social Worker 10/02/2019, 10:40 AM
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
## 2019-10-02 NOTE — [facility] Suicide Risk Assessment (Signed)
[facility] Discharge Suicide Risk Assessment   Principal Problem: MDD (major depressive disorder), recurrent severe, without psychosis (HCC) Discharge Diagnoses: Principal Problem:   MDD (major depressive disorder), recurrent severe, without psychosis (HCC) Active Problems:   Cocaine abuse (HCC)   Suicide and self-inflicted injuries by jumping from high place [facility])   Essential hypertension   COPD exacerbation (HCC)   Total Time spent with patient: 45 minutes Musculoskeletal: Strength & Muscle Tone: within normal limits Gait & Station: normal Patient leans: N/A  Psychiatric Specialty Exam: Physical Exam  Review of Systems  Blood pressure 122/72, pulse 90, temperature 98 F (36.7 C), temperature source Oral, resp. rate 18, height 5' (1.524 m), SpO2 97 %.Body mass index is 32.77 kg/m.  General Appearance: Casual  Eye Contact:  Good  Speech:  Clear and Coherent  Volume:  Normal  Mood:  Euthymic  Affect:  Congruent  Thought Process:  Goal Directed  Orientation:  Full (Time, Place, and Person)  Thought Content:  Logical  Suicidal Thoughts:  No  Homicidal Thoughts:  No  Memory:  Immediate;   Good Recent;   Good Remote;   Good  Judgement:  Good  Insight:  Good  Psychomotor Activity:  Normal  Concentration:  Concentration: Fair and Attention Span: Good  Recall:  [employer] of Knowledge:  Fair  Language:  Good  Akathisia:  Negative  Handed:  Right  AIMS (if indicated):     Assets:  Communication Skills Desire for Improvement  ADL's:  Intact  Cognition:  WNL  Sleep:  Number of Hours: 7.5   As discussed patient fell her left from a balcony in the context of an alcohol/cocaine induced blackout and if it was intentional it was in the context of this impairment but she denies suicidal thoughts plans or intent, states she "loves her grandchildren" and would never do that on purpose and is insistent upon discharge.  Her story has been consistent I do not find acute dangerousness nor  did Dr. Toni Amend yesterday  Mental Status Per Nursing Assessment::   On Admission:  NA  Demographic Factors:  Caucasian  Loss Factors: NA  Historical Factors: History of substance abuse  Risk Reduction Factors:   Sense of responsibility to family and Religious beliefs about death  Continued Clinical Symptoms:  Medical Diagnoses and Treatments/Surgeries  To include headaches since her fall/head injury/mild dizziness but no diplopia irritability or other signs of postconcussive syndrome but does have postconcussive symptoms  Cognitive Features That Contribute To Risk:  None    Suicide Risk:  Minimal: No identifiable suicidal ideation.  Patients presenting with no risk factors but with morbid ruminations; may be classified as minimal risk based on the severity of the depressive symptoms    Plan Of Care/Follow-up recommendations:  Activity:  full  Sabirin Baray, MD 10/02/2019, 8:46 AM

## 2019-10-02 NOTE — Progress Notes (Signed)
D:Patient denies SI/HI/AVH, able to contract for safety at this time. Pt appears calm and cooperative, and no distress noted.   A: All Personal items in locker returned to pt. Upon discharge.   R:  Pt States she will comply with discharge planning put into place and take MEDS as prescribed. Pt escorted out of the building by this writer/staff.  

## 2019-10-02 NOTE — Progress Notes (Signed)
Patient in the bed resting quietly at shift change up x2 through out the night for something to drink. Cooperative with treatment, she returned back to bed. No issues to report on shift at this time.

## 2019-10-02 NOTE — Discharge Summary (Signed)
Physician Discharge Summary Note  Patient:  [name] is an 49 y.o., female MRN:  [id number] DOB:  09/15/1970 Patient phone:  [phone] (home)  Patient address:   [address] [place] [address],  Total Time spent with patient: 45 minutes  Date of Admission:  10/01/2019 Date of Discharge: 10/02/2019  Reason for Admission:   History of Present Illness: Patient seen chart reviewed.  This is a 49 year old woman who was transferred to [place] from [facility] after stabilization for injuries from falling.  Patient had an accident on February 6 falling from estimated 20 feet.  Was taken to the hospital and treated for multiple injuries included liver laceration.  Patient has given contradictory reports about whether the fall was intentional.  On interview today she says she really does not remember.  She says that she had been drinking and using cocaine that day with a friend she had recently met.  This was after several years of being sober.  She remembers trying to hide from her husband the fact that she had been using alcohol and drugs.  Patient tells me today she believes that she slipped and fell and does not think that she was trying to kill her self.  She denies any current suicidal thoughts or intent.  She is a vague and not very clear historian about other mood symptoms now denying that she had been depressed recently.  She said she had however been recently trying to go to local mental health to see a counselor because she had just not been feeling right.  Denies any psychotic symptoms.  Denies any homicidal ideation.  Patient is primarily focused on her physical pain currently. Principal Problem: MDD (major depressive disorder), recurrent severe, without psychosis ([place]) Discharge Diagnoses: Principal Problem:   MDD (major depressive disorder), recurrent severe, without psychosis ([place]) Active Problems:   Cocaine abuse ([place])   Suicide and self-inflicted injuries by jumping from high place  [facility])   Essential hypertension   COPD exacerbation ([place])   Past Psychiatric History: History of polysubstance abuse  Past Medical History: History reviewed. No pertinent past medical history.  Past Surgical History:  Procedure Laterality Date  . ESOPHAGOGASTRODUODENOSCOPY N/A 01/24/2016   Procedure: ESOPHAGOGASTRODUODENOSCOPY (EGD);  Surgeon: Judeth Horn, MD;  Location: [place];  Service: General;  Laterality: N/A;  bedside   . PEG PLACEMENT N/A 01/24/2016   Procedure: PERCUTANEOUS ENDOSCOPIC GASTROSTOMY (PEG) PLACEMENT;  Surgeon: Judeth Horn, MD;  Location: [place];  Service: General;  Laterality: N/A;  . PERCUTANEOUS TRACHEOSTOMY N/A 01/24/2016   Procedure: PERCUTANEOUS TRACHEOSTOMY;  Surgeon: Judeth Horn, MD;  Location: [place];  Service: General;  Laterality: N/A;   Family History: History reviewed. No pertinent family history. Family Psychiatric  History: No new data shared Social History:  Social History   Substance and Sexual Activity  Alcohol Use Yes  . Alcohol/week: 0.0 standard drinks     Social History   Substance and Sexual Activity  Drug Use Yes  . Types: Cocaine    Social History   Socioeconomic History  . Marital status: Unknown    Spouse name: Not on file  . Number of children: Not on file  . Years of education: Not on file  . Highest education level: Not on file  Occupational History  . Not on file  Tobacco Use  . Smoking status: Current Every Day Smoker    Packs/day: 1.50    Types: Cigarettes  . Smokeless tobacco: Never Used  Substance and Sexual Activity  .  Alcohol use: Yes    Alcohol/week: 0.0 standard drinks  . Drug use: Yes    Types: Cocaine  . Sexual activity: Not on file  Other Topics Concern  . Not on file  Social History Narrative  . Not on file   Social Determinants of Health   Financial Resource Strain:   . Difficulty of Paying Living Expenses: Not on file  Food Insecurity:   . Worried About Charity fundraiser in the Last  Year: Not on file  . Ran Out of Food in the Last Year: Not on file  Transportation Needs:   . Lack of Transportation (Medical): Not on file  . Lack of Transportation (Non-Medical): Not on file  Physical Activity:   . Days of Exercise per Week: Not on file  . Minutes of Exercise per Session: Not on file  Stress:   . Feeling of Stress : Not on file  Social Connections:   . Frequency of Communication with Friends and Family: Not on file  . Frequency of Social Gatherings with Friends and Family: Not on file  . Attends Religious Services: Not on file  . Active Member of Clubs or Organizations: Not on file  . Attends Archivist Meetings: Not on file  . Marital Status: Not on file    Hospital Course:    Patient was transferred to the psychiatry floor for further observation as there was some concern that she may have injured herself on purpose, she also sustained a head injury.  Further she had a history of polysubstance abuse, a drug screen from 2017 showed a blood alcohol of 171, benzodiazepines cocaine and cannabis.  She stated she was frustrated that she had relapsed on cocaine and alcohol.  She was a consistent historian she really did not recall falling off of the balcony or leaping from the balcony simply recalls trying to prove to her friend that she was "steady and not drunk" at any rate she insisted she was not suicidal.  On mental status exam on 2/13 she was alert oriented to person place time situation denied wanting to harm self or others could contract fully for safety had no cravings tremors or withdrawal symptoms.  I was unable to reach her husband for collateral history but her history was consistent she is also told Dr. Weber Cooks that she was not suicidal and did not remember trying to harm herself, so if it was intentional it was in the context of an alcohol induced blackout.   Physical Findings: AIMS:  , ,  ,  ,    CIWA:  CIWA-Ar Total: 2 COWS:  COWS Total Score:  2  Musculoskeletal: Strength & Muscle Tone: within normal limits Gait & Station: normal Patient leans: N/A  Psychiatric Specialty Exam: Physical Exam  Review of Systems  Blood pressure 122/72, pulse 90, temperature 98 F (36.7 C), temperature source Oral, resp. rate 18, height 5' (1.524 m), SpO2 97 %.Body mass index is 32.77 kg/m.  General Appearance: Casual  Eye Contact:  Good  Speech:  Clear and Coherent  Volume:  Normal  Mood:  Euthymic  Affect:  Congruent  Thought Process:  Goal Directed  Orientation:  Full (Time, Place, and Person)  Thought Content:  Logical  Suicidal Thoughts:  No  Homicidal Thoughts:  No  Memory:  Immediate;   Good Recent;   Good Remote;   Good  Judgement:  Good  Insight:  Good  Psychomotor Activity:  Normal  Concentration:  Concentration: Fair and  Attention Span: Good  Recall:  [employer] of Knowledge:  Fair  Language:  Good  Akathisia:  Negative  Handed:  Right  AIMS (if indicated):     Assets:  Communication Skills Desire for Improvement  ADL's:  Intact  Cognition:  WNL  Sleep:  Number of Hours: 7.5     Have you used any form of tobacco in the last 30 days? (Cigarettes, Smokeless Tobacco, Cigars, and/or Pipes): Yes  Has this patient used any form of tobacco in the last 30 days? (Cigarettes, Smokeless Tobacco, Cigars, and/or Pipes) Yes, No  Blood Alcohol level:  Lab Results  Component Value Date   ETH 171 (H) 19/37/9024    Metabolic Disorder Labs:  Lab Results  Component Value Date   HGBA1C 5.2 02/23/2016   No results found for: PROLACTIN Lab Results  Component Value Date   TRIG 264 (H) 01/09/2016    See Psychiatric Specialty Exam and Suicide Risk Assessment completed by Attending Physician prior to discharge.  Discharge destination:  Home  Is patient on multiple antipsychotic therapies at discharge:  No   Has Patient had three or more failed trials of antipsychotic monotherapy by history:  No  Recommended Plan for  Multiple Antipsychotic Therapies: NA   Allergies as of 10/02/2019      Reactions   Latex Hives   Penicillins Hives   Has patient had a PCN reaction causing immediate rash, facial/tongue/throat swelling, SOB or lightheadedness with hypotension: Yes Has patient had a PCN reaction causing severe rash involving mucus membranes or skin necrosis: UNKNOWN (ACTIVE ALLERGY, per sister) Has patient had a PCN reaction that required hospitalization: UNKNOWN (ACTIVE ALLERGY, per sister) Has patient had a PCN reaction occurring within the last 10 years: UNKNOWN (ACTIVE ALLERGY, per sister) If all of the above answers are "NO",       Medication List    STOP taking these medications   acetaminophen-codeine 300-30 MG tablet Commonly known as: TYLENOL #3   chlordiazePOXIDE 5 MG capsule Commonly known as: LIBRIUM     TAKE these medications     Indication  divalproex 125 MG capsule Commonly known as: DEPAKOTE SPRINKLE Take 2 capsules (250 mg total) by mouth 2 (two) times daily. What changed:   how much to take  when to take this  Indication: Manic-Depression   nicotine 14 mg/24hr patch Commonly known as: NICODERM CQ - dosed in mg/24 hours 14 mg patch daily 2 weeks then 7 mg patch daily 3 weeks and stop  Indication: Nicotine Addiction   pantoprazole 40 MG tablet Commonly known as: PROTONIX Take 1 tablet (40 mg total) by mouth daily.  Indication: Heartburn   QUEtiapine 50 MG tablet Commonly known as: SEROQUEL Take 1 tablet (50 mg total) by mouth 3 (three) times daily.  Indication: Depressive Phase of Manic-Depression   saccharomyces boulardii 250 MG capsule Commonly known as: FLORASTOR Take 1 capsule (250 mg total) by mouth 2 (two) times daily.  Indication: home med   traMADol 50 MG tablet Commonly known as: ULTRAM Take 50 mg by mouth every 6 (six) hours as needed.  Indication: Pain   traZODone 50 MG tablet Commonly known as: DESYREL Take 1 tablet (50 mg total) by mouth at  bedtime as needed for sleep.  Indication: Abuse or Misuse of Alcohol       SignedJohnn Hai, MD 10/02/2019, 8:34 AM

## 2019-10-02 NOTE — Progress Notes (Signed)
Pt. At this time reporting feeling, "wheezy" and wanting to "stay ahead of my COPD". Pt. Given inhaler PRN upon request for comfort.

## 2019-10-02 NOTE — Progress Notes (Signed)
  [facility] Adult Case Management Discharge Plan :  Will you be returning to the same living situation after discharge:  Yes,  [facility]  At discharge, do you have transportation home?: Yes,  Rebekah Chesterfield, bouyfriend Do you have the ability to pay for your medications: Yes,  Pt is employed  Release of information consent forms completed and in the chart;  Patient's signature needed at discharge.  Patient to Follow up at: Follow-up Information    Inc[facility] Follow up.   Why: Follow up with [facility] during their walk in hours, M-F 8am-5pm. Contact information: [address] [place] [address] [phone]        Inc, [address] [facility] .   Contact information: [address] [place] [address] [phone]           Next level of care provider has access to [facility] Link:no  Safety Planning and Suicide Prevention discussed: Yes,  Rebekah Chesterfield, boyfriend  Have you used any form of tobacco in the last 30 days? (Cigarettes, Smokeless Tobacco, Cigars, and/or Pipes): Yes  Has patient been referred to the Quitline?: Patient refused referral  Patient has been referred for addiction treatment: Yes, [facility] in [place], [place]  Tikesha Mort P [place], [place] 10/02/2019, 10:54 AM

## 2019-10-04 ENCOUNTER — Encounter: Payer: Self-pay | Admitting: Psychiatry

## 2019-10-12 ENCOUNTER — Other Ambulatory Visit (HOSPITAL_COMMUNITY)
Admission: RE | Admit: 2019-10-12 | Discharge: 2019-10-12 | Disposition: A | Discharge status: Home / Self Care | Payer: Medicaid - Out of State | Source: Ambulatory Visit | Attending: Orthopaedic Surgery | Admitting: Orthopaedic Surgery

## 2019-10-12 DIAGNOSIS — Z01812 Encounter for preprocedural laboratory examination: Secondary | ICD-10-CM | POA: Diagnosis present

## 2019-10-12 DIAGNOSIS — Z20822 Contact with and (suspected) exposure to covid-19: Secondary | ICD-10-CM | POA: Insufficient documentation

## 2019-10-12 LAB — SARS CORONAVIRUS 2 (TAT 6-24 HRS): SARS Coronavirus 2: NEGATIVE

## 2019-10-13 ENCOUNTER — Other Ambulatory Visit: Payer: Self-pay

## 2019-10-13 ENCOUNTER — Encounter (HOSPITAL_COMMUNITY): Payer: Self-pay | Admitting: Orthopaedic Surgery

## 2019-10-13 NOTE — Anesthesia Preprocedure Evaluation (Addendum)
Anesthesia Evaluation  Patient identified by MRN, date of birth, ID band Patient awake    Reviewed: Allergy & Precautions, NPO status , Patient's Chart, lab work & pertinent test results  Airway Mallampati: III  TM Distance: >3 FB Neck ROM: Full    Dental no notable dental hx.    Pulmonary asthma , COPD,  COPD inhaler and oxygen dependent, Current Smoker and Patient abstained from smoking.,    Pulmonary exam normal breath sounds clear to auscultation       Cardiovascular hypertension, Pt. on medications + CAD and + Past MI  Normal cardiovascular exam Rhythm:Regular Rate:Normal  ECG: rate 114. Sinus tachycardia Right atrial enlargement LAD, consider left anterior fascicular block   Neuro/Psych PSYCHIATRIC DISORDERS Anxiety Depression Bipolar Disorder negative neurological ROS     GI/Hepatic Neg liver ROS, GERD  Controlled,  Endo/Other  negative endocrine ROS  Renal/GU negative Renal ROS     Musculoskeletal negative musculoskeletal ROS (+)   Abdominal (+) + obese,   Peds  Hematology negative hematology ROS (+)   Anesthesia Other Findings displaced fracture right olecranon  Reproductive/Obstetrics                            Anesthesia Physical Anesthesia Plan  ASA: III  Anesthesia Plan: General and Regional   Post-op Pain Management: GA combined w/ Regional for post-op pain   Induction: Intravenous  PONV Risk Score and Plan: 2 and Ondansetron, Dexamethasone, Midazolam and Treatment may vary due to age or medical condition  Airway Management Planned: LMA  Additional Equipment:   Intra-op Plan:   Post-operative Plan: Extubation in OR  Informed Consent: I have reviewed the patients History and Physical, chart, labs and discussed the procedure including the risks, benefits and alternatives for the proposed anesthesia with the patient or authorized representative who has indicated  his/her understanding and acceptance.     Dental advisory given  Plan Discussed with: CRNA  Anesthesia Plan Comments: (Reviewed PAT note written 10/13/2019 by Shonna Chock, PA-C. SAME DAY WORK-UP  )      Anesthesia Quick Evaluation

## 2019-10-13 NOTE — Progress Notes (Signed)
Pt denies SOB, chest pain,  being under the care of a cardiologist and PCP.  Pt denies having a stress test, echo and cardiac cath. Pt stated that last dose of Aspirin was " Monday" as instructed by surgeon. Pt made aware to stop taking  vitamins, fish oil and herbal medications. Do not take any NSAIDs ie: Ibuprofen, Advil, Naproxen (Aleve), Motrin, BC and Goody Powder. Pt reminded to quarantine. Pt verbalized understanding of all pre-op instructions. See PA, Anesthesiology, note.
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
## 2019-10-14 ENCOUNTER — Ambulatory Visit (HOSPITAL_COMMUNITY)
Admission: RE | Admit: 2019-10-14 | Discharge: 2019-10-14 | Disposition: A | Discharge status: Home / Self Care | Payer: Medicaid Other | Attending: Orthopaedic Surgery | Admitting: Orthopaedic Surgery

## 2019-10-14 ENCOUNTER — Encounter (HOSPITAL_COMMUNITY): Payer: Self-pay | Admitting: Orthopaedic Surgery

## 2019-10-14 ENCOUNTER — Other Ambulatory Visit: Payer: Self-pay

## 2019-10-14 ENCOUNTER — Ambulatory Visit (HOSPITAL_COMMUNITY): Payer: Medicaid Other | Admitting: Vascular Surgery

## 2019-10-14 ENCOUNTER — Encounter (HOSPITAL_COMMUNITY)
Admission: RE | Disposition: A | Discharge status: Home / Self Care | Payer: Self-pay | Source: Home / Self Care | Attending: Orthopaedic Surgery

## 2019-10-14 DIAGNOSIS — Z7982 Long term (current) use of aspirin: Secondary | ICD-10-CM | POA: Insufficient documentation

## 2019-10-14 DIAGNOSIS — Z79899 Other long term (current) drug therapy: Secondary | ICD-10-CM | POA: Diagnosis not present

## 2019-10-14 DIAGNOSIS — K219 Gastro-esophageal reflux disease without esophagitis: Secondary | ICD-10-CM | POA: Insufficient documentation

## 2019-10-14 DIAGNOSIS — I444 Left anterior fascicular block: Secondary | ICD-10-CM | POA: Diagnosis not present

## 2019-10-14 DIAGNOSIS — Z880 Allergy status to penicillin: Secondary | ICD-10-CM | POA: Insufficient documentation

## 2019-10-14 DIAGNOSIS — I252 Old myocardial infarction: Secondary | ICD-10-CM | POA: Diagnosis not present

## 2019-10-14 DIAGNOSIS — J449 Chronic obstructive pulmonary disease, unspecified: Secondary | ICD-10-CM | POA: Diagnosis not present

## 2019-10-14 DIAGNOSIS — F319 Bipolar disorder, unspecified: Secondary | ICD-10-CM | POA: Diagnosis not present

## 2019-10-14 DIAGNOSIS — E669 Obesity, unspecified: Secondary | ICD-10-CM | POA: Diagnosis not present

## 2019-10-14 DIAGNOSIS — Z9981 Dependence on supplemental oxygen: Secondary | ICD-10-CM | POA: Diagnosis not present

## 2019-10-14 DIAGNOSIS — Z91040 Latex allergy status: Secondary | ICD-10-CM | POA: Diagnosis not present

## 2019-10-14 DIAGNOSIS — T84192A Other mechanical complication of internal fixation device of bone of right forearm, initial encounter: Secondary | ICD-10-CM | POA: Diagnosis present

## 2019-10-14 DIAGNOSIS — Z90710 Acquired absence of both cervix and uterus: Secondary | ICD-10-CM | POA: Diagnosis not present

## 2019-10-14 DIAGNOSIS — Y9389 Activity, other specified: Secondary | ICD-10-CM | POA: Insufficient documentation

## 2019-10-14 DIAGNOSIS — Z809 Family history of malignant neoplasm, unspecified: Secondary | ICD-10-CM | POA: Insufficient documentation

## 2019-10-14 DIAGNOSIS — I517 Cardiomegaly: Secondary | ICD-10-CM | POA: Diagnosis not present

## 2019-10-14 DIAGNOSIS — R000 Tachycardia, unspecified: Secondary | ICD-10-CM | POA: Diagnosis not present

## 2019-10-14 DIAGNOSIS — I2510 Atherosclerotic heart disease of native coronary artery without angina pectoris: Secondary | ICD-10-CM | POA: Diagnosis not present

## 2019-10-14 DIAGNOSIS — Z6835 Body mass index (BMI) 35.0-35.9, adult: Secondary | ICD-10-CM | POA: Insufficient documentation

## 2019-10-14 DIAGNOSIS — I10 Essential (primary) hypertension: Secondary | ICD-10-CM | POA: Diagnosis not present

## 2019-10-14 DIAGNOSIS — X58XXXA Exposure to other specified factors, initial encounter: Secondary | ICD-10-CM | POA: Insufficient documentation

## 2019-10-14 DIAGNOSIS — F17210 Nicotine dependence, cigarettes, uncomplicated: Secondary | ICD-10-CM | POA: Diagnosis not present

## 2019-10-14 HISTORY — DX: Bipolar disorder, unspecified: F31.9

## 2019-10-14 HISTORY — DX: Displaced fracture of olecranon process without intraarticular extension of unspecified ulna, initial encounter for closed fracture: S52.023A

## 2019-10-14 HISTORY — DX: Essential (primary) hypertension: I10

## 2019-10-14 HISTORY — DX: Gastro-esophageal reflux disease without esophagitis: K21.9

## 2019-10-14 HISTORY — DX: Unspecified asthma, uncomplicated: J45.909

## 2019-10-14 HISTORY — PX: ORIF ELBOW FRACTURE: SHX5031

## 2019-10-14 HISTORY — DX: Acute myocardial infarction, unspecified: I21.9

## 2019-10-14 SURGERY — OPEN REDUCTION INTERNAL FIXATION (ORIF) ELBOW/OLECRANON FRACTURE
Anesthesia: Regional | Site: Elbow | Laterality: Right

## 2019-10-14 MED ORDER — HYDROCODONE-ACETAMINOPHEN 5-325 MG PO TABS: 1.0000 | ORAL_TABLET | ORAL | 0 refills | Status: AC | PRN

## 2019-10-14 MED ORDER — PROPOFOL 10 MG/ML IV BOLUS
INTRAVENOUS | Status: DC | PRN
  Administered 2019-10-14: 200 mg via INTRAVENOUS

## 2019-10-14 MED ORDER — FENTANYL CITRATE (PF) 250 MCG/5ML IJ SOLN
INTRAMUSCULAR | Status: AC
  Filled 2019-10-14: qty 5

## 2019-10-14 MED ORDER — POVIDONE-IODINE 10 % EX SWAB
2.0000 "application " | Freq: Once | CUTANEOUS | Status: AC
  Administered 2019-10-14: 2 via TOPICAL

## 2019-10-14 MED ORDER — LACTATED RINGERS IV SOLN: INTRAVENOUS | Status: DC

## 2019-10-14 MED ORDER — ACETAMINOPHEN 10 MG/ML IV SOLN: 1000.0000 mg | Freq: Once | INTRAVENOUS | Status: DC | PRN

## 2019-10-14 MED ORDER — OXYCODONE HCL 5 MG/5ML PO SOLN: 5.0000 mg | Freq: Once | ORAL | Status: DC | PRN

## 2019-10-14 MED ORDER — FENTANYL CITRATE (PF) 100 MCG/2ML IJ SOLN
INTRAMUSCULAR | Status: DC | PRN
  Administered 2019-10-14: 50 ug via INTRAVENOUS

## 2019-10-14 MED ORDER — CLONIDINE HCL (ANALGESIA) 100 MCG/ML EP SOLN
EPIDURAL | Status: DC | PRN
  Administered 2019-10-14: 100 ug

## 2019-10-14 MED ORDER — MIDAZOLAM HCL 2 MG/2ML IJ SOLN: 2.0000 mg | Freq: Once | INTRAMUSCULAR | Status: AC

## 2019-10-14 MED ORDER — CHLORHEXIDINE GLUCONATE 4 % EX LIQD: 60.0000 mL | Freq: Once | CUTANEOUS | Status: DC

## 2019-10-14 MED ORDER — 0.9 % SODIUM CHLORIDE (POUR BTL) OPTIME
TOPICAL | Status: DC | PRN
  Administered 2019-10-14: 15:00:00 1000 mL

## 2019-10-14 MED ORDER — MIDAZOLAM HCL 2 MG/2ML IJ SOLN
INTRAMUSCULAR | Status: AC
  Administered 2019-10-14: 2 mg via INTRAVENOUS
  Filled 2019-10-14: qty 2

## 2019-10-14 MED ORDER — ROPIVACAINE HCL 5 MG/ML IJ SOLN
INTRAMUSCULAR | Status: DC | PRN
  Administered 2019-10-14: 30 mL via PERINEURAL

## 2019-10-14 MED ORDER — ENSURE PRE-SURGERY PO LIQD: 296.0000 mL | Freq: Once | ORAL | Status: DC

## 2019-10-14 MED ORDER — CLINDAMYCIN PHOSPHATE 900 MG/50ML IV SOLN
INTRAVENOUS | Status: AC
  Filled 2019-10-14: qty 50

## 2019-10-14 MED ORDER — CLINDAMYCIN PHOSPHATE 900 MG/50ML IV SOLN
900.0000 mg | INTRAVENOUS | Status: AC
  Administered 2019-10-14: 900 mg via INTRAVENOUS

## 2019-10-14 MED ORDER — MIDAZOLAM HCL 2 MG/2ML IJ SOLN
INTRAMUSCULAR | Status: AC
  Filled 2019-10-14: qty 2

## 2019-10-14 MED ORDER — PROMETHAZINE HCL 25 MG/ML IJ SOLN: 6.2500 mg | INTRAMUSCULAR | Status: DC | PRN

## 2019-10-14 MED ORDER — LIDOCAINE 2% (20 MG/ML) 5 ML SYRINGE
INTRAMUSCULAR | Status: DC | PRN
  Administered 2019-10-14: 40 mg via INTRAVENOUS

## 2019-10-14 MED ORDER — FENTANYL CITRATE (PF) 100 MCG/2ML IJ SOLN
INTRAMUSCULAR | Status: AC
  Administered 2019-10-14: 100 ug via INTRAVENOUS
  Filled 2019-10-14: qty 2

## 2019-10-14 MED ORDER — OXYCODONE HCL 5 MG PO TABS: 5.0000 mg | ORAL_TABLET | Freq: Once | ORAL | Status: DC | PRN

## 2019-10-14 MED ORDER — FENTANYL CITRATE (PF) 100 MCG/2ML IJ SOLN: 100.0000 ug | Freq: Once | INTRAMUSCULAR | Status: AC

## 2019-10-14 MED ORDER — BUPIVACAINE HCL (PF) 0.5 % IJ SOLN
INTRAMUSCULAR | Status: AC
  Filled 2019-10-14: qty 30

## 2019-10-14 MED ORDER — HYDROMORPHONE HCL 1 MG/ML IJ SOLN: 0.2500 mg | INTRAMUSCULAR | Status: DC | PRN

## 2019-10-14 SURGICAL SUPPLY — 79 items
BIT DRILL 2.0 LNG QUCK RELEASE (BIT) ×1 IMPLANT
BIT DRILL 2.8 QUICK RELEASE (BIT) ×1 IMPLANT
BLADE SURG 10 STRL SS (BLADE) IMPLANT
BLADE SURG 11 STRL SS (BLADE) IMPLANT
BNDG COHESIVE 4X5 TAN STRL (GAUZE/BANDAGES/DRESSINGS) IMPLANT
BNDG ELASTIC 3X5.8 VLCR STR LF (GAUZE/BANDAGES/DRESSINGS) ×2 IMPLANT
BNDG ELASTIC 4X5.8 VLCR STR LF (GAUZE/BANDAGES/DRESSINGS) ×2 IMPLANT
BNDG GAUZE ELAST 4 BULKY (GAUZE/BANDAGES/DRESSINGS) ×2 IMPLANT
COVER MAYO STAND STRL (DRAPES) ×2 IMPLANT
COVER SURGICAL LIGHT HANDLE (MISCELLANEOUS) ×2 IMPLANT
COVER WAND RF STERILE (DRAPES) IMPLANT
CUFF TOURN SGL QUICK 18X4 (TOURNIQUET CUFF) ×2 IMPLANT
CUFF TOURN SGL QUICK 24 (TOURNIQUET CUFF)
CUFF TRNQT CYL 24X4X16.5-23 (TOURNIQUET CUFF) IMPLANT
DRAPE C-ARM 42X72 X-RAY (DRAPES) IMPLANT
DRAPE HALF SHEET 40X57 (DRAPES) ×2 IMPLANT
DRAPE INCISE IOBAN 66X45 STRL (DRAPES) ×2 IMPLANT
DRAPE U-SHAPE 47X51 STRL (DRAPES) ×2 IMPLANT
DRILL 2.0 LNG QUICK RELEASE (BIT) ×2
DRILL 2.8 QUICK RELEASE (BIT) ×2
DURAPREP 26ML APPLICATOR (WOUND CARE) ×2 IMPLANT
ELECT CAUTERY BLADE 6.4 (BLADE) ×2 IMPLANT
ELECT REM PT RETURN 9FT ADLT (ELECTROSURGICAL) ×2
ELECTRODE REM PT RTRN 9FT ADLT (ELECTROSURGICAL) ×1 IMPLANT
FIBER TAPE 2MM (SUTURE) ×2 IMPLANT
GAUZE SPONGE 4X4 12PLY STRL (GAUZE/BANDAGES/DRESSINGS) ×2 IMPLANT
GAUZE XEROFORM 1X8 LF (GAUZE/BANDAGES/DRESSINGS) IMPLANT
GAUZE XEROFORM 5X9 LF (GAUZE/BANDAGES/DRESSINGS) ×2 IMPLANT
GLOVE BIOGEL PI IND STRL 8 (GLOVE) ×1 IMPLANT
GLOVE BIOGEL PI INDICATOR 8 (GLOVE) ×1
GLOVE ECLIPSE 8.0 STRL XLNG CF (GLOVE) ×2 IMPLANT
GLOVE SURG SYN 7.5  E (GLOVE) ×2
GLOVE SURG SYN 7.5 E (GLOVE) ×2 IMPLANT
GOWN STRL REUS W/ TWL LRG LVL3 (GOWN DISPOSABLE) ×3 IMPLANT
GOWN STRL REUS W/TWL LRG LVL3 (GOWN DISPOSABLE) ×6
GUIDEWIRE ORTH 6X062XTROC NS (WIRE) ×3 IMPLANT
K-WIRE .062 (WIRE) ×3
KIT BASIN OR (CUSTOM PROCEDURE TRAY) ×2 IMPLANT
KIT TURNOVER KIT B (KITS) ×2 IMPLANT
MANIFOLD NEPTUNE II (INSTRUMENTS) ×2 IMPLANT
NEEDLE 22X1 1/2 (OR ONLY) (NEEDLE) ×2 IMPLANT
NS IRRIG 1000ML POUR BTL (IV SOLUTION) ×2 IMPLANT
PACK ORTHO EXTREMITY (CUSTOM PROCEDURE TRAY) ×2 IMPLANT
PAD ARMBOARD 7.5X6 YLW CONV (MISCELLANEOUS) ×4 IMPLANT
PAD CAST 3X4 CTTN HI CHSV (CAST SUPPLIES) ×1 IMPLANT
PAD CAST 4YDX4 CTTN HI CHSV (CAST SUPPLIES) ×1 IMPLANT
PADDING CAST COTTON 3X4 STRL (CAST SUPPLIES) ×1
PADDING CAST COTTON 4X4 STRL (CAST SUPPLIES) ×1
PLATE BONE OLEC 90MM 5H RT (Plate) ×2 IMPLANT
RETRIEVER SUT HEWSON (MISCELLANEOUS) ×2 IMPLANT
SCREW CORTICAL 3.5X20MM (Screw) ×2 IMPLANT
SCREW HEX LOCK 2.7X14MM (Screw) ×4 IMPLANT
SCREW HEX LOCK 2.7X16MM (Screw) ×4 IMPLANT
SCREW HEXALOBE LOCKING 3.5X14M (Screw) ×2 IMPLANT
SCREW HEXALOBE LOCKING 3.5X16M (Screw) ×2 IMPLANT
SCREW HEXALOBE NON-LOCK 3.5X16 (Screw) ×2 IMPLANT
SCREW HEXALOBE VA 3.5X44 (Screw) ×2 IMPLANT
SCREW LOCKING 3.5X45MM (Screw) ×2 IMPLANT
SLING ARM IMMOBILIZER MED (SOFTGOODS) ×2 IMPLANT
SPONGE LAP 18X18 RF (DISPOSABLE) ×2 IMPLANT
SPONGE LAP 4X18 RFD (DISPOSABLE) ×2 IMPLANT
STAPLER VISISTAT 35W (STAPLE) ×2 IMPLANT
STOCKINETTE IMPERVIOUS 9X36 MD (GAUZE/BANDAGES/DRESSINGS) IMPLANT
SUCTION FRAZIER HANDLE 10FR (MISCELLANEOUS) ×1
SUCTION TUBE FRAZIER 10FR DISP (MISCELLANEOUS) ×1 IMPLANT
SUT VIC AB 0 CT1 27 (SUTURE) ×2
SUT VIC AB 0 CT1 27XBRD ANBCTR (SUTURE) ×2 IMPLANT
SUT VIC AB 2-0 CT1 27 (SUTURE) ×2
SUT VIC AB 2-0 CT1 TAPERPNT 27 (SUTURE) ×2 IMPLANT
SWAB COLLECTION DEVICE MRSA (MISCELLANEOUS) ×2 IMPLANT
SWAB CULTURE ESWAB REG 1ML (MISCELLANEOUS) ×2 IMPLANT
SYR CONTROL 10ML LL (SYRINGE) ×2 IMPLANT
TAPE FIBER 2MM 7IN #2 BLUE (SUTURE) ×2 IMPLANT
TOWEL GREEN STERILE (TOWEL DISPOSABLE) ×2 IMPLANT
TOWEL GREEN STERILE FF (TOWEL DISPOSABLE) ×2 IMPLANT
TUBE CONNECTING 12X1/4 (SUCTIONS) ×2 IMPLANT
UNDERPAD 30X30 (UNDERPADS AND DIAPERS) ×2 IMPLANT
WATER STERILE IRR 1000ML POUR (IV SOLUTION) ×2 IMPLANT
YANKAUER SUCT BULB TIP NO VENT (SUCTIONS) ×2 IMPLANT

## 2019-10-14 NOTE — Anesthesia Postprocedure Evaluation (Signed)
Anesthesia Post Note  Patient: Shalicia D Burnsed  Procedure(s) Performed: Right olecranon revision open reduction and internal fixation (Right Elbow)     Patient location during evaluation: PACU Anesthesia Type: Regional and General Level of consciousness: awake and alert Pain management: pain level controlled Vital Signs Assessment: post-procedure vital signs reviewed and stable Respiratory status: spontaneous breathing, nonlabored ventilation, respiratory function stable and patient connected to nasal cannula oxygen Cardiovascular status: blood pressure returned to baseline and stable Postop Assessment: no apparent nausea or vomiting Anesthetic complications: no    Last Vitals:  Vitals:   10/14/19 1702 10/14/19 1717  BP: 100/68 101/70  Pulse: 66 83  Resp: 19 17  Temp: 36.4 C 36.8 C  SpO2: 99% 99%    Last Pain:  Vitals:   10/14/19 1702  TempSrc:   PainSc: 0-No pain                 Ryley Bachtel P Toree Edling

## 2019-10-14 NOTE — Transfer of Care (Signed)
Immediate Anesthesia Transfer of Care Note  Patient: [name]  Procedure(s) Performed: Right olecranon revision open reduction and internal fixation (Right Elbow)  Patient Location: PACU  Anesthesia Type:GA combined with regional for post-op pain  Level of Consciousness: drowsy and patient cooperative  Airway & Oxygen Therapy: Patient Spontanous Breathing and Patient connected to nasal cannula oxygen  Post-op Assessment: Report given to RN, Post -op Vital signs reviewed and stable and Patient moving all extremities  Post vital signs: Reviewed and stable  Last Vitals:  Vitals Value Taken Time  BP    Temp    Pulse 66 10/14/19 1702  Resp 19 10/14/19 1702  SpO2 99 % 10/14/19 1702  Vitals shown include unvalidated device data.  Last Pain:  Vitals:   10/14/19 1355  TempSrc:   PainSc: 0-No pain      Patients Stated Pain Goal: 3 (10/14/19 1157)  Complications: No apparent anesthesia complications

## 2019-10-14 NOTE — Anesthesia Procedure Notes (Signed)
Procedure Name: LMA Insertion Date/Time: 10/14/2019 2:37 PM Performed by: Shireen Quan, CRNA Pre-anesthesia Checklist: Patient identified, Emergency Drugs available, Suction available and Patient being monitored Patient Re-evaluated:Patient Re-evaluated prior to induction Oxygen Delivery Method: Circle System Utilized Preoxygenation: Pre-oxygenation with 100% oxygen Induction Type: IV induction Ventilation: Mask ventilation without difficulty LMA: LMA inserted LMA Size: 4.0 Number of attempts: 1 Placement Confirmation: positive ETCO2 Tube secured with: Tape Dental Injury: Teeth and Oropharynx as per pre-operative assessment

## 2019-10-14 NOTE — H&P (Signed)
ORTHOPAEDIC H&P  PCP:  Patient, No Pcp Per  Chief Complaint: Right olecranon fracture  HPI: [name] is a 49 y.o. female who complains of a right olecranon fracture.  She was treated by me approximately 2 weeks ago after she had a fall from second story balcony.  She sustained a type I open olecranon fracture which was treated with irrigation debridement as well as open reduction and internal fixation.  She is seen at her 2-week postop visit where repeat radiographs were obtained which showed loss of reduction and repeat displacement of her previous fracture.  I discussed treatment options with her and did recommend proceeding forward with a very revision open reduction and internal fixation of her fracture and she presents today for that.  Her pain is been controlled.  She denies any new injuries.  She has had no numbness or tingling to the hand.  Past Medical History:  Diagnosis Date  . Asthma   . Bipolar disorder (HCC)    from chart  . GERD (gastroesophageal reflux disease)   . Hypertension   . Myocardial infarction (HCC)    08/20/2019 per pt  . Olecranon fracture    right   Past Surgical History:  Procedure Laterality Date  . ABDOMINAL HYSTERECTOMY    . CHOLECYSTECTOMY    . ESOPHAGOGASTRODUODENOSCOPY N/A 01/24/2016   Procedure: ESOPHAGOGASTRODUODENOSCOPY (EGD);  Surgeon: Jimmye Norman, MD;  Location: [facility] ENDOSCOPY;  Service: General;  Laterality: N/A;  bedside   . LOWER EXTREMITY ANGIOGRAM N/A 09/25/2019   Procedure: Aortagram with renal angiogram nonselective.;  Surgeon: Sherren Kerns, MD;  Location: [facility] OR;  Service: Vascular;  Laterality: N/A;  . ORIF ELBOW FRACTURE Right 09/25/2019   Procedure: Open Reduction Internal Fixation (Orif) Elbow/Olecranon Fracture irrigation and debridement, Right;  Surgeon: Ernest Mallick, MD;  Location: [facility] OR;  Service: Orthopedics;  Laterality: Right;  . PEG PLACEMENT N/A 01/24/2016   Procedure: PERCUTANEOUS ENDOSCOPIC GASTROSTOMY  (PEG) PLACEMENT;  Surgeon: Jimmye Norman, MD;  Location: [facility] ENDOSCOPY;  Service: General;  Laterality: N/A;  . PERCUTANEOUS TRACHEOSTOMY N/A 01/24/2016   Procedure: PERCUTANEOUS TRACHEOSTOMY;  Surgeon: Jimmye Norman, MD;  Location: [facility] OR;  Service: General;  Laterality: N/A;   Social History   Socioeconomic History  . Marital status: Unknown    Spouse name: Not on file  . Number of children: Not on file  . Years of education: Not on file  . Highest education level: Not on file  Occupational History  . Not on file  Tobacco Use  . Smoking status: Current Every Day Smoker    Packs/day: 1.00    Types: Cigarettes  . Smokeless tobacco: Never Used  Substance and Sexual Activity  . Alcohol use: Yes    Alcohol/week: 0.0 standard drinks    Comment: occasional  . Drug use: Yes    Types: Cocaine    Comment: denies 10/13/19  . Sexual activity: Yes  Other Topics Concern  . Not on file  Social History Narrative   ** Merged History Encounter **       Social Determinants of Health   Financial Resource Strain:   . Difficulty of Paying Living Expenses: Not on file  Food Insecurity:   . Worried About Programme researcher, broadcasting/film/video in the Last Year: Not on file  . Ran Out of Food in the Last Year: Not on file  Transportation Needs:   . Lack of Transportation (Medical): Not on file  . Lack of Transportation (Non-Medical): Not  on file  Physical Activity:   . Days of Exercise per Week: Not on file  . Minutes of Exercise per Session: Not on file  Stress:   . Feeling of Stress : Not on file  Social Connections:   . Frequency of Communication with Friends and Family: Not on file  . Frequency of Social Gatherings with Friends and Family: Not on file  . Attends Religious Services: Not on file  . Active Member of Clubs or Organizations: Not on file  . Attends Archivist Meetings: Not on file  . Marital Status: Not on file   Family History  Problem Relation Age of Onset  . Cancer Father     Allergies  Allergen Reactions  . Latex Hives  . Penicillins Hives    Has patient had a PCN reaction causing immediate rash, facial/tongue/throat swelling, SOB or lightheadedness with hypotension: Yes Has patient had a PCN reaction causing severe rash involving mucus membranes or skin necrosis: UNKNOWN (ACTIVE ALLERGY, per sister) Has patient had a PCN reaction that required hospitalization: UNKNOWN (ACTIVE ALLERGY, per sister) Has patient had a PCN reaction occurring within the last 10 years: UNKNOWN (ACTIVE ALLERGY, per sister) If all of the above answers are "NO",    Prior to Admission medications   Medication Sig Start Date End Date Taking? Authorizing Provider  acetaminophen (TYLENOL) 500 MG tablet Take 1,000 mg by mouth every 6 (six) hours as needed for mild pain.   Yes [provider]  albuterol (VENTOLIN HFA) 108 (90 Base) MCG/ACT inhaler Inhale 2 puffs into the lungs every 6 (six) hours as needed for wheezing or shortness of breath.   Yes [provider]  ascorbic acid (VITAMIN C) 500 MG tablet Take 1 tablet (500 mg total) by mouth daily. 10/01/19  Yes Meuth, Blaine Hamper, PA-C  aspirin EC 81 MG tablet Take 81 mg by mouth daily.   Yes [provider]  divalproex (DEPAKOTE SPRINKLE) 125 MG capsule Take 2 capsules (250 mg total) by mouth 2 (two) times daily. 10/02/19  Yes Johnn Hai, MD  ferrous sulfate 325 (65 FE) MG tablet Take 1 tablet (325 mg total) by mouth 2 (two) times daily with a meal. Patient taking differently: Take 325 mg by mouth once a week.  10/01/19  Yes Meuth, Brooke A, PA-C  guaiFENesin (MUCINEX) 600 MG 12 hr tablet Take 1 tablet (600 mg total) by mouth 2 (two) times daily. Patient taking differently: Take 600 mg by mouth daily as needed for cough or to loosen phlegm.  10/01/19  Yes Meuth, Brooke A, PA-C  HYDROcodone-acetaminophen (NORCO/VICODIN) 5-325 MG tablet Take 1-2 tablets by mouth every 4 (four) hours as needed. 10/12/19  Yes [provider]  lisinopril (ZESTRIL) 20 MG tablet Take 20 mg by mouth daily.   Yes [provider]  QUEtiapine (SEROQUEL) 50 MG tablet Take 1 tablet (50 mg total) by mouth 3 (three) times daily. 10/02/19  Yes Johnn Hai, MD  traZODone (DESYREL) 50 MG tablet Take 1 tablet (50 mg total) by mouth at bedtime as needed for sleep. Patient taking differently: Take 50 mg by mouth at bedtime.  10/02/19  Yes Johnn Hai, MD  bisacodyl (DULCOLAX) 10 MG suppository Place 1 suppository (10 mg total) rectally daily as needed for moderate constipation. Patient not taking: Reported on 10/13/2019 10/01/19   Margie Billet A, PA-C  docusate sodium (COLACE) 100 MG capsule Take 1 capsule (100 mg total) by mouth 2 (two) times daily. Patient not taking:  Reported on 10/13/2019 10/01/19   Franne Forts, PA-C  folic acid (FOLVITE) 1 MG tablet Take 1 tablet (1 mg total) by mouth daily. Patient not taking: Reported on 10/13/2019 10/01/19   Carlena Bjornstad A, PA-C  methocarbamol (ROBAXIN) 500 MG tablet Take 2 tablets (1,000 mg total) by mouth every 8 (eight) hours as needed for muscle spasms. Patient not taking: Reported on 10/13/2019 10/01/19   Carlena Bjornstad A, PA-C  Multiple Vitamin (MULTIVITAMIN WITH MINERALS) TABS tablet Take 1 tablet by mouth daily. Patient not taking: Reported on 10/13/2019 10/01/19   Franne Forts, PA-C  nicotine (NICODERM CQ - DOSED IN MG/24 HOURS) 14 mg/24hr patch 14 mg patch daily 2 weeks then 7 mg patch daily 3 weeks and stop Patient not taking: Reported on 10/13/2019 02/19/16   Angiulli, Mcarthur Rossetti, PA-C  oxyCODONE (OXY IR/ROXICODONE) 5 MG immediate release tablet Take 1-2 tablets (5-10 mg total) by mouth every 4 (four) hours as needed for moderate pain or severe pain. Patient not taking: Reported on 10/13/2019 10/01/19   Carlena Bjornstad A, PA-C  pantoprazole (PROTONIX) 40 MG tablet Take 1 tablet (40 mg total) by mouth daily. Patient not taking: Reported on 10/13/2019 02/19/16   Angiulli, Mcarthur Rossetti, PA-C   polyethylene glycol (MIRALAX / GLYCOLAX) 17 g packet Take 17 g by mouth daily. Patient not taking: Reported on 10/13/2019 10/01/19   Carlena Bjornstad A, PA-C  QUEtiapine (SEROQUEL) 100 MG tablet Take 1 tablet (100 mg total) by mouth at bedtime. Patient not taking: Reported on 10/13/2019 10/01/19   Franne Forts, PA-C  saccharomyces boulardii (FLORASTOR) 250 MG capsule Take 1 capsule (250 mg total) by mouth 2 (two) times daily. Patient not taking: Reported on 10/13/2019 02/19/16   Angiulli, Mcarthur Rossetti, PA-C  thiamine 100 MG tablet Take 1 tablet (100 mg total) by mouth daily. Patient not taking: Reported on 10/13/2019 10/01/19   Carlena Bjornstad A, PA-C   No results found.  Positive ROS: All other systems have been reviewed and were otherwise negative with the exception of those mentioned in the HPI and as above.  Physical Exam: General: Alert, no acute distress Cardiovascular: No pedal edema Respiratory: No cyanosis, no use of accessory musculature Skin: No lesions in the area of chief complaint Psychiatric: Patient is competent for consent with normal mood and affect Lymphatic: No axillary or cervical lymphadenopathy  MUSCULOSKELETAL: Examination of the right elbow shows well-healed posterior incision. There is mild swelling about the elbow but no erythema or signs of infection. She has tenderness to palpation to the posterior elbow as well as minimal to no elbow range of motion. She has full range of motion of the digits and intact sensation throughout all fingers.  Assessment: Previous highly comminuted right olecranon fracture status post open reduction and internal fixation on 09/25/2019 with failed reduction  Plan: Plan to proceed to the OR today for revision open reduction and internal fixation of her right comminuted olecranon fracture.  The risks of surgery were once again discussed with patient which include but are not limited to infection, bleeding, damage to surrounding structures including  blood vessels and nerves, pain, stiffness, malunion, nonunion, implant failure need for additional procedures.  Informed consent was obtained.  The patient's right arm was marked.  Plan for discharge home postoperatively with follow-up with me in approximately 10 to 14 days.    Ernest Mallick, MD [phone]   10/14/2019 2:25 PM

## 2019-10-14 NOTE — Op Note (Signed)
PREOPERATIVE DIAGNOSIS: Highly comminuted right intra-articular olecranon fracture with failed internal fixation  POSTOPERATIVE DIAGNOSIS: Same  ATTENDING PHYSICIAN: Gasper Lloyd. Roney Mans, III, MD who was present and scrubbed for the entire case   ASSISTANT SURGEON: None.   ANESTHESIA: General with regional  SURGICAL PROCEDURES: 1.  Revision open reduction and internal fixation of right intra-articular, highly comminuted olecranon fracture 2.  Deep hardware removal from right elbow  SURGICAL INDICATIONS: Patient is a 49 year old female who had a high-energy injury to her right elbow proximal 2 weeks ago.  She sustained a type I open highly comminuted olecranon fracture.  She underwent open reduction and internal fixation shortly after the injury.  She was seen in my clinic earlier this week where she was found to have complete loss of reduction with proximal migration of the tip of the olecranon piece.  We discussed treatment options and I did recommend proceeding forward with revision open reduction and internal fixation to help better align her articular surface as well as maintain the function of her triceps.  After discussing the risks and benefits of this and surgery she did wish to proceed and presents today for that.  FINDING: The previous plate and screw construct was removed successfully.  Krakauer suture used lysing fiber tape was placed within the triceps tendon and utilized to achieve a near anatomic reduction of the proximal olecranon tip.  There was some bone loss within the central portion of the olecranon but congruent joint was achieved.  Stable fixation was achieved with a new, better fitting Acumed olecranon plate.  DESCRIPTION OF PROCEDURE: The patient was identified in the preop holding area where the risk benefits and alternatives of the procedure were once again discussed with the patient.  These include but are not limited to infection, bleeding, damage to surrounding  structures include blood vessels and nerves, pain, stiffness, malunion, nonunion, implant failure, posttraumatic arthritis and need for additional procedures.  Informed consent was obtained at that time the patient's right arm was marked with a surgical marking pen.  She then underwent a right upper extremity plexus block by anesthesia.  She was brought to the operative suite where a timeout was performed identifying the correct patient operative site.  She was induced under general anesthesia and positioned in the lateral position with the right arm on a radiolucent arm holder.  The right upper extremity was then prepped and draped in usual sterile fashion.  The previous incision along the posterior lateral aspect of the elbow was utilized.  This was extended both proximally and distally elevating radial and ulnar skin flaps.  Dissection was continued down utilizing Bovie electrocautery to the plate which was removed using the Acumed screwdrivers.  The plate was also removed without issue.  There was a complete displacement of the proximal, articular fragment and was not captured well within the plate and screw construct.  Dissection was performed around the tricep tendon mobilizing the tendon and articular surface of the proximal fracture portion.  The ulnar nerve was dissected out and protected.  A fiber tape suture was placed in a Krakw fashion up and down the tricep tendon to aid in mobilization and fixation of the proximal piece.  With traction through the sutures and direct palpation of the proximal portion, reduction was achieved.  This had congruent congruent joint surface and near anatomic alignment of the proximal fracture fragments.  K wires were placed through the fracture crossing into the more distal portion of the ulna helping to hold the reduction  in place.  The tricep then was split longitudinally in between the 2 limbs of the Krakw suture.  This allowed for a more proximally fitting, Acumed  olecranon plate to be placed along the fracture.  This was pinned into place and confirmed to be in appropriate positioning under fluoroscopic images.  The plate was then adjusted and bent slightly to fit the contours of her olecranon somewhat better.  The 2 tails of the Krakw suture were then passed through the plate to allow for further tension and time down later.  At this point a cortical screw was placed distally within the oblong hole of the plate.  This translated the plate somewhat distally as well as reduce the plate down to the bone along the posterior ulna.  2, long locking screws were placed within the most proximal portion of the plate.  This captured the previously displaced proximal articular piece and secured it through the locking holes.  Appropriate positioning of the screws was confirmed under fluoroscopic images.  At this point multiple locking and nonlocking screws were placed within the remaining holes of the plate.  Fluoroscopic images again were obtained which showed appropriate positioning of the plate and screw length and alignment.  All screws were extra-articular and there did appear to be appropriate fixation of the previously displaced proximal articular piece.  The fiber tape suture was then passed deep to the plate to allow it to be tied along the side of the proximal to avoid a prominent knot in the soft tissues.  At this point the elbow was ranged throughout a full arc of motion stressing are fracture repair.  Fluoroscopic images were obtained which showed maintained alignment and no displacement or evidence of migration of the fracture.  The wound at this point was then copiously irrigated with normal saline.  The skin was closed with deep 0 Vicryl sutures followed by 2-0 Vicryl sutures and staples.  Xeroform, 4 x 4's and a well-padded long-arm splint were then placed.  The patient was awoken from her anesthesia and extubated in the operating without any complications.  She was  taken the PACU in stable condition.  She tolerated the procedure well and there are no complications.  RADIOGRAPHIC INTERPRETATION: PA and lateral radiographs of the elbow were obtained intraoperative under fluoroscopic images.  The show interval reduction and near-anatomic alignment of the previously displaced highly comminuted olecranon fracture.  Appropriate plate and screw positioning is noted.  ESTIMATED BLOOD LOSS: 200 mL  TOURNIQUET TIME: None  SPECIMENS: Aerobic and anaerobic cultures x1  POSTOPERATIVE PLAN: The patient will be discharged home and seen back  in the office in approximately 10-14 days for wound check, suture  removal, and then be sent to a therapist for gentle elbow range of motion exercises, edema control and a splint.  IMPLANTS: Proximally fitting Acumed olecranon plate.

## 2019-10-14 NOTE — Anesthesia Procedure Notes (Signed)
Anesthesia Regional Block: Supraclavicular block   Pre-Anesthetic Checklist: ,, timeout performed, Correct Patient, Correct Site, Correct Laterality, Correct Procedure, Correct Position, site marked, Risks and benefits discussed,  Surgical consent,  Pre-op evaluation,  At surgeon's request and post-op pain management  Laterality: Right  Prep: chloraprep       Needles:  Injection technique: Single-shot  Needle Type: Echogenic Stimulator Needle     Needle Length: 9cm  Needle Gauge: 21     Additional Needles:   Procedures:,,,, ultrasound used (permanent image in chart),,,,  Narrative:  Start time: 10/14/2019 1:30 PM End time: 10/14/2019 1:40 PM Injection made incrementally with aspirations every 5 mL.  Performed by: Personally  Anesthesiologist: Leonides Grills, MD  Additional Notes: Functioning IV was confirmed and monitors were applied.  A timeout was performed. Sterile prep, hand hygiene and sterile gloves were used. A 74mm 21ga Arrow echogenic stimulator needle was used. Negative aspiration and negative test dose prior to incremental administration of local anesthetic. The patient tolerated the procedure well.  Ultrasound guidance: relevent anatomy identified, needle position confirmed, local anesthetic spread visualized around nerve(s), vascular puncture avoided.  Image printed for medical record.

## 2019-10-14 NOTE — Discharge Instructions (Signed)
Discharge Instructions ° °- Keep dressings in place. Do not remove them. °- The dressings must stay dry °- Take all medication as prescribed. Transition to over the counter pain medication as your pain improves °- Keep the hand elevated over the next 48-72 hours to help with pain and swelling °- Move all digits not restricted by the dressings regularly to prevent stiffness °- Please call to schedule a follow up appointment with Dr. Kostantinos Tallman and therapy at [phone] for 10-14 days following surgery °- Your pain medication have been send digitally to your pharmacy °

## 2019-10-15 ENCOUNTER — Encounter: Payer: Self-pay | Admitting: *Deleted

## 2019-10-19 LAB — AEROBIC/ANAEROBIC CULTURE W GRAM STAIN (SURGICAL/DEEP WOUND)
Culture: NO GROWTH
Gram Stain: NONE SEEN

## 2019-11-03 ENCOUNTER — Ambulatory Visit: Payer: Medicaid - Out of State | Attending: Orthopaedic Surgery | Admitting: *Deleted

## 2022-08-01 DIAGNOSIS — I10 Essential (primary) hypertension: Secondary | ICD-10-CM | POA: Diagnosis not present

## 2022-08-01 DIAGNOSIS — Z20822 Contact with and (suspected) exposure to covid-19: Secondary | ICD-10-CM | POA: Diagnosis not present

## 2022-08-01 DIAGNOSIS — M159 Polyosteoarthritis, unspecified: Secondary | ICD-10-CM | POA: Diagnosis not present

## 2022-08-01 DIAGNOSIS — R69 Illness, unspecified: Secondary | ICD-10-CM | POA: Diagnosis not present

## 2022-08-01 DIAGNOSIS — Z6829 Body mass index (BMI) 29.0-29.9, adult: Secondary | ICD-10-CM | POA: Diagnosis not present

## 2022-08-01 DIAGNOSIS — K219 Gastro-esophageal reflux disease without esophagitis: Secondary | ICD-10-CM | POA: Diagnosis not present

## 2022-08-01 DIAGNOSIS — J449 Chronic obstructive pulmonary disease, unspecified: Secondary | ICD-10-CM | POA: Diagnosis not present

## 2022-08-01 DIAGNOSIS — J069 Acute upper respiratory infection, unspecified: Secondary | ICD-10-CM | POA: Diagnosis not present

## 2022-10-05 DIAGNOSIS — N888 Other specified noninflammatory disorders of cervix uteri: Secondary | ICD-10-CM | POA: Diagnosis not present

## 2022-10-05 DIAGNOSIS — R109 Unspecified abdominal pain: Secondary | ICD-10-CM | POA: Diagnosis not present

## 2022-10-05 DIAGNOSIS — L02211 Cutaneous abscess of abdominal wall: Secondary | ICD-10-CM | POA: Diagnosis not present

## 2022-10-05 DIAGNOSIS — E876 Hypokalemia: Secondary | ICD-10-CM | POA: Diagnosis not present

## 2022-10-12 DIAGNOSIS — R0602 Shortness of breath: Secondary | ICD-10-CM | POA: Diagnosis not present

## 2023-07-10 LAB — GLUCOSE, POCT (MANUAL RESULT ENTRY): POC Glucose: 155 mg/dL — AB (ref 70–99)
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
## 2024-09-17 DIAGNOSIS — Z23 Encounter for immunization: Secondary | ICD-10-CM

## 2024-09-17 DIAGNOSIS — I252 Old myocardial infarction: Secondary | ICD-10-CM

## 2024-09-17 DIAGNOSIS — I10 Essential (primary) hypertension: Secondary | ICD-10-CM | POA: Diagnosis present

## 2024-09-17 DIAGNOSIS — Z5982 Transportation insecurity: Secondary | ICD-10-CM

## 2024-09-17 DIAGNOSIS — F3160 Bipolar disorder, current episode mixed, unspecified: Principal | ICD-10-CM | POA: Diagnosis present

## 2024-09-17 DIAGNOSIS — Z79899 Other long term (current) drug therapy: Secondary | ICD-10-CM

## 2024-09-17 DIAGNOSIS — F1410 Cocaine abuse, uncomplicated: Secondary | ICD-10-CM | POA: Diagnosis present

## 2024-09-17 DIAGNOSIS — K219 Gastro-esophageal reflux disease without esophagitis: Secondary | ICD-10-CM | POA: Diagnosis present

## 2024-09-17 DIAGNOSIS — Z91148 Patient's other noncompliance with medication regimen for other reason: Secondary | ICD-10-CM

## 2024-09-17 DIAGNOSIS — F419 Anxiety disorder, unspecified: Secondary | ICD-10-CM | POA: Diagnosis present

## 2024-09-17 DIAGNOSIS — J4489 Other specified chronic obstructive pulmonary disease: Secondary | ICD-10-CM | POA: Diagnosis present

## 2024-09-17 DIAGNOSIS — Z9151 Personal history of suicidal behavior: Secondary | ICD-10-CM

## 2024-09-17 DIAGNOSIS — Z91040 Latex allergy status: Secondary | ICD-10-CM

## 2024-09-17 DIAGNOSIS — Z59819 Housing instability, housed unspecified: Secondary | ICD-10-CM

## 2024-09-17 DIAGNOSIS — Z880 Allergy status to penicillin: Secondary | ICD-10-CM

## 2024-09-17 DIAGNOSIS — G4700 Insomnia, unspecified: Secondary | ICD-10-CM | POA: Diagnosis present

## 2024-09-17 DIAGNOSIS — Z59869 Financial insecurity, unspecified: Secondary | ICD-10-CM

## 2024-09-17 DIAGNOSIS — Z833 Family history of diabetes mellitus: Secondary | ICD-10-CM

## 2024-09-17 DIAGNOSIS — Z809 Family history of malignant neoplasm, unspecified: Secondary | ICD-10-CM

## 2024-09-17 DIAGNOSIS — Z8249 Family history of ischemic heart disease and other diseases of the circulatory system: Secondary | ICD-10-CM

## 2024-09-17 DIAGNOSIS — F17210 Nicotine dependence, cigarettes, uncomplicated: Secondary | ICD-10-CM | POA: Diagnosis present

## 2024-09-17 DIAGNOSIS — R45851 Suicidal ideations: Secondary | ICD-10-CM | POA: Diagnosis present

## 2024-09-17 MED ADMIN — Divalproex Sodium Tab Delayed Release 250 MG: 250 mg | ORAL | NDC 00904686061

## 2024-09-17 MED ADMIN — Albuterol Sulfate Inhal Aero 108 MCG/ACT (90MCG Base Equiv): 2 | RESPIRATORY_TRACT | NDC 69097014260

## 2024-09-17 MED ADMIN — Pantoprazole Sodium EC Tab 40 MG (Base Equiv): 40 mg | ORAL | NDC 51079005101

## 2024-09-17 MED ADMIN — Nicotine TD Patch 24HR 21 MG/24HR: 21 mg | TRANSDERMAL | NDC 00536589688

## 2024-09-17 MED ADMIN — Lisinopril Tab 20 MG: 20 mg | ORAL | NDC 00904679961

## 2024-09-17 MED ADMIN — Quetiapine Fumarate Tab 50 MG: 50 mg | ORAL | NDC 50268063111

## 2024-09-17 MED ADMIN — Aripiprazole Tab 5 MG: 5 mg | ORAL | NDC 60687016811

## 2024-09-17 MED ADMIN — Gabapentin Cap 300 MG: 300 mg | ORAL | NDC 60687059111

## 2024-09-17 MED ADMIN — Acetaminophen Tab 325 MG: 650 mg | ORAL | NDC 50580047511

## 2024-09-17 MED FILL — Lisinopril Tab 20 MG: 20.0000 mg | ORAL | Qty: 1 | Status: AC

## 2024-09-17 MED FILL — Nicotine TD Patch 24HR 21 MG/24HR: 21.0000 mg | TRANSDERMAL | Qty: 1 | Status: AC

## 2024-09-17 MED FILL — Pantoprazole Sodium EC Tab 40 MG (Base Equiv): 40.0000 mg | ORAL | Qty: 1 | Status: AC

## 2024-09-17 MED FILL — Divalproex Sodium Tab Delayed Release 250 MG: 250.0000 mg | ORAL | Qty: 1 | Status: AC

## 2024-09-18 MED ADMIN — Divalproex Sodium Tab Delayed Release 250 MG: 250 mg | ORAL | NDC 00904686061

## 2024-09-18 MED ADMIN — Dextromethorphan-Guaifenesin Syrup 10-100 MG/5ML: 5 mL | ORAL | NDC 00121063805

## 2024-09-18 MED ADMIN — Albuterol Sulfate Inhal Aero 108 MCG/ACT (90MCG Base Equiv): 2 | RESPIRATORY_TRACT | NDC 00173068224

## 2024-09-18 MED ADMIN — Gabapentin Cap 300 MG: 300 mg | ORAL | NDC 60687059111

## 2024-09-18 MED ADMIN — Quetiapine Fumarate Tab 50 MG: 50 mg | ORAL | NDC 60687033811

## 2024-09-18 MED ADMIN — Pantoprazole Sodium EC Tab 40 MG (Base Equiv): 40 mg | ORAL | NDC 51079005101

## 2024-09-18 MED ADMIN — Umeclidinium-Vilanterol Aero Powd BA 62.5-25 MCG/ACT: 1 | RESPIRATORY_TRACT | NDC 00173086906

## 2024-09-18 MED ADMIN — Nicotine TD Patch 24HR 21 MG/24HR: 21 mg | TRANSDERMAL | NDC 00536589688

## 2024-09-18 MED ADMIN — Trazodone HCl Tab 50 MG: 50 mg | ORAL | NDC 60687044311

## 2024-09-18 MED ADMIN — Lisinopril Tab 20 MG: 20 mg | ORAL | NDC 00904679961

## 2024-09-18 MED ADMIN — Aripiprazole Tab 5 MG: 5 mg | ORAL | NDC 60687016811

## 2024-09-18 MED ADMIN — Influenza Virus Vaccine Split PF Susp Pref Syringe 0.5 ML: 0.5 mL | INTRAMUSCULAR | NDC 49281042588

## 2024-09-18 MED FILL — Dextromethorphan-Guaifenesin Syrup 10-100 MG/5ML: 5.0000 mL | ORAL | Qty: 5 | Status: AC

## 2024-09-18 MED FILL — Gabapentin Cap 300 MG: 300.0000 mg | ORAL | Qty: 1 | Status: AC

## 2024-09-18 MED FILL — Quetiapine Fumarate Tab 50 MG: 50.0000 mg | ORAL | Qty: 1 | Status: AC

## 2024-09-19 MED ADMIN — Divalproex Sodium Tab Delayed Release 250 MG: 250 mg | ORAL | NDC 60687086811

## 2024-09-19 MED ADMIN — Divalproex Sodium Tab Delayed Release 250 MG: 250 mg | ORAL | NDC 00904686061

## 2024-09-19 MED ADMIN — Dextromethorphan-Guaifenesin Syrup 10-100 MG/5ML: 5 mL | ORAL | NDC 00121063805

## 2024-09-19 MED ADMIN — Albuterol Sulfate Inhal Aero 108 MCG/ACT (90MCG Base Equiv): 2 | RESPIRATORY_TRACT | NDC 00173068224

## 2024-09-19 MED ADMIN — Gabapentin Cap 300 MG: 300 mg | ORAL | NDC 60687059111

## 2024-09-19 MED ADMIN — Quetiapine Fumarate Tab 50 MG: 50 mg | ORAL | NDC 60687033811

## 2024-09-19 MED ADMIN — Pantoprazole Sodium EC Tab 40 MG (Base Equiv): 40 mg | ORAL | NDC 51079005101

## 2024-09-19 MED ADMIN — Umeclidinium-Vilanterol Aero Powd BA 62.5-25 MCG/ACT: 1 | RESPIRATORY_TRACT | NDC 00173086906

## 2024-09-19 MED ADMIN — Nicotine TD Patch 24HR 21 MG/24HR: 21 mg | TRANSDERMAL | NDC 00536589688

## 2024-09-19 MED ADMIN — Trazodone HCl Tab 50 MG: 50 mg | ORAL | NDC 60687044311

## 2024-09-19 MED ADMIN — Lisinopril Tab 20 MG: 20 mg | ORAL | NDC 00904679961

## 2024-09-20 MED ADMIN — Divalproex Sodium Tab Delayed Release 250 MG: 250 mg | ORAL | NDC 60687086811

## 2024-09-20 MED ADMIN — Dextromethorphan-Guaifenesin Syrup 10-100 MG/5ML: 5 mL | ORAL | NDC 00121063805

## 2024-09-20 MED ADMIN — Albuterol Sulfate Inhal Aero 108 MCG/ACT (90MCG Base Equiv): 2 | RESPIRATORY_TRACT | NDC 00173068224

## 2024-09-20 MED ADMIN — Gabapentin Cap 300 MG: 300 mg | ORAL | NDC 60687059111

## 2024-09-20 MED ADMIN — Quetiapine Fumarate Tab 50 MG: 50 mg | ORAL | NDC 50268063111

## 2024-09-20 MED ADMIN — Quetiapine Fumarate Tab 50 MG: 50 mg | ORAL | NDC 60687033811

## 2024-09-20 MED ADMIN — Pantoprazole Sodium EC Tab 40 MG (Base Equiv): 40 mg | ORAL | NDC 51079005101

## 2024-09-20 MED ADMIN — Umeclidinium-Vilanterol Aero Powd BA 62.5-25 MCG/ACT: 1 | RESPIRATORY_TRACT | NDC 00173086906

## 2024-09-20 MED ADMIN — Nicotine TD Patch 24HR 21 MG/24HR: 21 mg | TRANSDERMAL | NDC 00536589688

## 2024-09-20 MED ADMIN — Trazodone HCl Tab 50 MG: 50 mg | ORAL | NDC 60687044311

## 2024-09-20 MED ADMIN — Lisinopril Tab 20 MG: 20 mg | ORAL | NDC 00904679961

## 2024-09-20 MED ADMIN — Diclofenac Sodium Gel 1% (1.16% Diethylamine Equiv): 2 g | TOPICAL | NDC 58602060407

## 2024-09-21 MED ADMIN — Divalproex Sodium Tab Delayed Release 250 MG: 250 mg | ORAL | NDC 60687086811

## 2024-09-21 MED ADMIN — Dextromethorphan-Guaifenesin Syrup 10-100 MG/5ML: 5 mL | ORAL | NDC 00121063805

## 2024-09-21 MED ADMIN — Albuterol Sulfate Inhal Aero 108 MCG/ACT (90MCG Base Equiv): 2 | RESPIRATORY_TRACT | NDC 00173068224

## 2024-09-21 MED ADMIN — Gabapentin Cap 300 MG: 300 mg | ORAL | NDC 60687059111

## 2024-09-21 MED ADMIN — Quetiapine Fumarate Tab 50 MG: 50 mg | ORAL | NDC 50268063111

## 2024-09-21 MED ADMIN — Pantoprazole Sodium EC Tab 40 MG (Base Equiv): 40 mg | ORAL | NDC 51079005101

## 2024-09-21 MED ADMIN — Umeclidinium-Vilanterol Aero Powd BA 62.5-25 MCG/ACT: 1 | RESPIRATORY_TRACT | NDC 00173086906

## 2024-09-21 MED ADMIN — Nicotine TD Patch 24HR 21 MG/24HR: 21 mg | TRANSDERMAL | NDC 00536589688

## 2024-09-21 MED ADMIN — Trazodone HCl Tab 50 MG: 50 mg | ORAL | NDC 60687044311

## 2024-09-21 MED ADMIN — Lisinopril Tab 20 MG: 20 mg | ORAL | NDC 00904679961

## 2024-09-21 MED ADMIN — Fluconazole Tab 150 MG: 150 mg | ORAL | NDC 16714069210

## 2024-09-21 MED ADMIN — Diclofenac Sodium Gel 1% (1.16% Diethylamine Equiv): 2 g | TOPICAL | NDC 58602060407

## 2024-09-21 MED ADMIN — Magnesium Hydroxide Susp 400 MG/5ML: 30 mL | ORAL | NDC 00121043130

## 2024-09-22 MED ADMIN — Divalproex Sodium Tab Delayed Release 250 MG: 250 mg | ORAL | NDC 60687086811

## 2024-09-22 MED ADMIN — Dextromethorphan-Guaifenesin Syrup 10-100 MG/5ML: 5 mL | ORAL | NDC 00121063805

## 2024-09-22 MED ADMIN — Albuterol Sulfate Inhal Aero 108 MCG/ACT (90MCG Base Equiv): 2 | RESPIRATORY_TRACT | NDC 00173068224

## 2024-09-22 MED ADMIN — Gabapentin Cap 300 MG: 300 mg | ORAL | NDC 00904666661

## 2024-09-22 MED ADMIN — Quetiapine Fumarate Tab 50 MG: 50 mg | ORAL | NDC 50268063111

## 2024-09-22 MED ADMIN — Pantoprazole Sodium EC Tab 40 MG (Base Equiv): 40 mg | ORAL | NDC 51079005101

## 2024-09-22 MED ADMIN — Umeclidinium-Vilanterol Aero Powd BA 62.5-25 MCG/ACT: 1 | RESPIRATORY_TRACT | NDC 00173086906

## 2024-09-22 MED ADMIN — Nicotine TD Patch 24HR 21 MG/24HR: 21 mg | TRANSDERMAL | NDC 60505706300

## 2024-09-22 MED ADMIN — Trazodone HCl Tab 50 MG: 50 mg | ORAL | NDC 60687044311

## 2024-09-22 MED ADMIN — Lisinopril Tab 20 MG: 20 mg | ORAL | NDC 00904679961

## 2024-09-23 ENCOUNTER — Encounter: Payer: Self-pay | Admitting: *Deleted

## 2024-09-23 MED ORDER — NICOTINE 21 MG/24HR TD PT24: 21.0000 mg | MEDICATED_PATCH | Freq: Every day | TRANSDERMAL | 0 refills | Status: AC

## 2024-09-23 MED ORDER — TRAZODONE HCL 50 MG PO TABS: 50.0000 mg | ORAL_TABLET | Freq: Every evening | ORAL | 0 refills | Status: AC | PRN

## 2024-09-23 MED ORDER — QUETIAPINE FUMARATE 50 MG PO TABS: 50.0000 mg | ORAL_TABLET | Freq: Three times a day (TID) | ORAL | 0 refills | Status: AC

## 2024-09-23 MED ORDER — DIVALPROEX SODIUM 250 MG PO DR TAB: 250.0000 mg | DELAYED_RELEASE_TABLET | Freq: Two times a day (BID) | ORAL | 0 refills | Status: AC

## 2024-09-23 MED ORDER — ALBUTEROL SULFATE HFA 108 (90 BASE) MCG/ACT IN AERS: 2.0000 | INHALATION_SPRAY | Freq: Four times a day (QID) | RESPIRATORY_TRACT | 0 refills | Status: AC | PRN

## 2024-09-23 MED ORDER — UMECLIDINIUM-VILANTEROL 62.5-25 MCG/ACT IN AEPB: 1.0000 | INHALATION_SPRAY | Freq: Every day | RESPIRATORY_TRACT | 0 refills | Status: AC

## 2024-09-23 MED ORDER — DICLOFENAC SODIUM 1 % EX GEL: 2.0000 g | Freq: Three times a day (TID) | CUTANEOUS | 0 refills | Status: AC | PRN

## 2024-09-23 MED ADMIN — Divalproex Sodium Tab Delayed Release 250 MG: 250 mg | ORAL | NDC 60687086811

## 2024-09-23 MED ADMIN — Dextromethorphan-Guaifenesin Syrup 10-100 MG/5ML: 5 mL | ORAL | NDC 00121063805

## 2024-09-23 MED ADMIN — Albuterol Sulfate Inhal Aero 108 MCG/ACT (90MCG Base Equiv): 2 | RESPIRATORY_TRACT | NDC 00173068224

## 2024-09-23 MED ADMIN — Gabapentin Cap 300 MG: 300 mg | ORAL | NDC 00904666661

## 2024-09-23 MED ADMIN — Quetiapine Fumarate Tab 50 MG: 50 mg | ORAL | NDC 50268063111

## 2024-09-23 MED ADMIN — Pantoprazole Sodium EC Tab 40 MG (Base Equiv): 40 mg | ORAL | NDC 51079005101

## 2024-09-23 MED ADMIN — Umeclidinium-Vilanterol Aero Powd BA 62.5-25 MCG/ACT: 1 | RESPIRATORY_TRACT | NDC 00173086906

## 2024-09-23 MED ADMIN — Nicotine TD Patch 24HR 21 MG/24HR: 21 mg | TRANSDERMAL | NDC 60505706300

## 2024-09-23 MED ADMIN — Lisinopril Tab 20 MG: 20 mg | ORAL | NDC 00904679961

## 2024-09-23 NOTE — Progress Notes (Signed)
(  Sleep Hours) -9.25 (Any PRNs that were needed, meds refused, or side effects to meds)- Robutussin, inhaler, trazodone  (Any disturbances and when (visitation, over night)- none (Concerns raised by the patient)- none (SI/HI/AVH)- denies

## 2024-09-23 NOTE — Transportation (Signed)
 09/23/2024  Rupa Knabe DOB: 09-Dec-1970 MRN: [id number]   RIDER WAIVER AND RELEASE OF LIABILITY  For the purposes of helping with transportation needs, [facility] partners with outside transportation providers (taxi companies, [place], catering manager.) to give [facility] patients or other approved people the choice of on-demand rides Public Librarian) to our buildings for non-emergency visits.  By using [employer], I, the person signing this document, on behalf of myself and/or any legal minors (in my care using the [employer]), agree:  Science Writer given to me are supplied by independent, outside transportation providers who do not work for, or have any affiliation with, [employer]. [place] is not a transportation company. [place] has no control over the quality or safety of the rides I get using [employer]. [facility] has no control over whether any outside ride will happen on time or not. [facility] gives no guarantee on the reliability, quality, safety, or availability on any rides, or that no mistakes will happen. I know and accept that traveling by vehicle (car, truck, SVU, fleeta, bus, taxi, etc.) has risks of serious injuries such as disability, being paralyzed, and death. I know and agree the risk of using [employer] is mine alone, and not [employer]. [employer] are provided as is and as are available. The transportation providers are in charge for all inspections and care of the vehicles used to provide these rides. I agree not to take legal action against [facility], its agents, employees, officers, directors, representatives, insurers, attorneys, assigns, successors, subsidiaries, and affiliates at any time for any reasons related directly or indirectly to using [employer]. I also agree not to take legal action against [facility] or its affiliates for any injury, death, or damage to property caused by or related to using  [employer]. I have read this Waiver and Release of Liability, and I understand the terms used in it and their legal meaning. This Waiver is freely and voluntarily given with the understanding that my right (or any legal minors) to legal action against [place] relating to [employer] is knowingly given up to use these services.   I attest that I read the Ride Waiver and Release of Liability to [employer], gave Ms. Cortez the opportunity to ask questions and answered the questions asked (if any). I affirm that [employer] then provided consent for assistance with transportation.

## 2024-09-23 NOTE — Discharge Summary (Signed)
 " Physician Discharge Summary Note  Patient:  [name] is an 54 y.o., female MRN:  [id number] DOB:  August 16, 1971 Patient phone:  [phone] (home)  Patient address:   [address] [place] [address],   Date of Admission:  09/17/2024 Date of Discharge: 09/23/2024  Reason for Admission:    CC: Not acting myself and thoughts of suicide and harming other people.   History of Present Illness: [name] is a 54 year old Caucasian female with prior psychiatric diagnoses significant for bipolar disorder mixed, depression, anxiety, and substance use disorder specifically cocaine use, who presents involuntarily to [facility] from [facility] for worsening episode of bipolar mix disorder resulting in an attempt to cut her throat with a knife and pointing this knife towards her son in the context of, command auditory and visual hallucinations.  After medical examination, stabilization, and clearance patient was transferred to [facility] for further psychiatric evaluation and treatment.   During this evaluation, patient reports hearing voices for a week and has been seeing shadows on the walls for about 3 weeks.  On 09/14/2024, patient reports attempting to cut her throat with a knife because voices were telling her to hurt herself.  Reports her son wanted to take her to the hospital but she talked him out of it.  Then on 09/15/2024, patient had voices telling her to harm her mother.  At that time she took a handful of gabapentin  and was going to take them all, when her son noticed what she was doing and knocked the pills out of her hand.  Report swallowing 3 of the pills, and added that she wanted to kill herself because she did not want to hurt anyone else.  Reports her last suicide attempt was one year ago when she tried to overdose and at that time was hospitalized in the inpatient psychiatric unit in [facility] behavioral health.  Patient reports she is not being  followed by an outpatient mental health provider at this time.  Denies access to any firearms.  Endorses drinking a couple of beers 2-4 beers a week.  Reports she has been clean from cocaine since September 2025, but messed up on the 09/13/2024 when she used about $80 worth of cocaine due to stressors from ex-husband and work stressors.  Report her appetite and her sleep has not been good for the past weeks.  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   Patient reports symptoms of depression to include sadness, loneliness, hopelessness, worthlessness, anhedonia, and insomnia.  Reports anxiety symptoms as being jittery, her ' thought process all over the place', and increased heart rate.  Reports symptoms of psychosis as hallucinations both visual and auditory, paranoia, the devil telling me to do bad stuff, delusions - believing on black magic.  Reports manic symptoms that lasted for a week of increased drinking alcohol, using cocaine, no sleep, cleaning and feeling invincible.  Denies symptoms of PTSD, and OCD.  Principal Problem: Bipolar 1 disorder, mixed (HCC) Discharge Diagnoses: Principal Problem:   Bipolar 1 disorder, mixed (HCC)   Past Psychiatric History:  Previous Psych Diagnoses: Bipolar 1 disorder Prior inpatient treatment: Yes x 1, at [facility] behavioral health Current/prior outpatient treatment: Patient denies Prior rehab hx: Denies Psychotherapy hx: Yes History of suicide: X 1, 20 years ago when patient attempted to overdose with medications History of homicide or aggression: Denies Psychiatric medication history: Patient has  been on trial gabapentin , Seroquel , Haldol , Risperdal . Psychiatric medication compliance history: Reports noncompliance due to, not liking the effects of these psychotropic medications on her Neuromodulation history: Denies Current Psychiatrist: Denies Current therapist:  Denies  Past Medical History:  Past Medical History:  Diagnosis Date   Asthma    Bipolar disorder (HCC)    from chart   GERD (gastroesophageal reflux disease)    Hypertension    Myocardial infarction (HCC)    08/20/2019 per pt   Olecranon fracture    right    Past Surgical History:  Procedure Laterality Date   ABDOMINAL HYSTERECTOMY     CHOLECYSTECTOMY     ESOPHAGOGASTRODUODENOSCOPY N/A 01/24/2016   Procedure: ESOPHAGOGASTRODUODENOSCOPY (EGD);  Surgeon: Lynwood Pina, MD;  Location: [facility] ENDOSCOPY;  Service: General;  Laterality: N/A;  bedside    LOWER EXTREMITY ANGIOGRAM N/A 09/25/2019   Procedure: Aortagram with renal angiogram nonselective.;  Surgeon: Harvey Carlin BRAVO, MD;  Location: [facility] OR;  Service: Vascular;  Laterality: N/A;   ORIF ELBOW FRACTURE Right 09/25/2019   Procedure: Open Reduction Internal Fixation (Orif) Elbow/Olecranon Fracture irrigation and debridement, Right;  Surgeon: Carolee Lynwood JINNY DOUGLAS, MD;  Location: [facility] OR;  Service: Orthopedics;  Laterality: Right;   ORIF ELBOW FRACTURE Right 10/14/2019   Procedure: Right olecranon revision open reduction and internal fixation;  Surgeon: Carolee Lynwood JINNY DOUGLAS, MD;  Location: [facility] OR;  Service: Orthopedics;  Laterality: Right;    PEG PLACEMENT N/A 01/24/2016   Procedure: PERCUTANEOUS ENDOSCOPIC GASTROSTOMY (PEG) PLACEMENT;  Surgeon: Lynwood Pina, MD;  Location: [facility] ENDOSCOPY;  Service: General;  Laterality: N/A;   PERCUTANEOUS TRACHEOSTOMY N/A 01/24/2016   Procedure: PERCUTANEOUS TRACHEOSTOMY;  Surgeon: Lynwood Pina, MD;  Location: [facility] OR;  Service: General;  Laterality: N/A;   Family History:  Family History  Problem Relation Age of Onset   Cancer Father    Family Psychiatric  History:  Medical: Reports mom has history of type 2 diabetes and hypertension Psych: Denies Psych Rx: Denies SA/HA: Denies Substance use family hx: Did not denies  Social History:  Social History   Substance and Sexual Activity  Alcohol Use Yes    Alcohol/week: 0.0 standard drinks of alcohol   Comment: occasional     Social History   Substance and Sexual Activity  Drug Use Yes   Types: Cocaine   Comment: denies 10/13/19    Social History   Socioeconomic History   Marital status: Unknown    Spouse name: Not on file   Number of children: Not on file   Years of education: Not on file   Highest education level: Not on file  Occupational History   Not on file  Tobacco Use   Smoking status: Every Day    Current packs/day: 1.00    Average packs/day: 1 pack/day for 24.0 years (24.0 ttl pk-yrs)    Types: Cigarettes    Start date: 09/20/2000   Smokeless tobacco: Never  Vaping Use   Vaping status: Never Used  Substance and Sexual Activity   Alcohol use: Yes    Alcohol/week: 0.0 standard drinks of alcohol    Comment: occasional   Drug use: Yes    Types: Cocaine    Comment: denies 10/13/19   Sexual activity: Yes  Other Topics Concern   Not on file  Social History Narrative   ** Merged History Encounter **       ** Merged History Encounter **       Social Drivers of Health   Tobacco  Use: High Risk (09/17/2024)   Patient History    Smoking Tobacco Use: Every Day    Smokeless Tobacco Use: Never    Passive Exposure: Not on file  Financial Resource Strain: Not on file  Food Insecurity: No Food Insecurity (09/17/2024)   Epic    Worried About Programme Researcher, Broadcasting/film/video in the Last Year: Never true    Ran Out of Food in the Last Year: Never true  Transportation Needs: Unmet Transportation Needs (09/17/2024)   Epic    Lack of Transportation (Medical): Yes    Lack of Transportation (Non-Medical): No  Physical Activity: Not on file  Stress: Not on file  Social Connections: Not on file  Depression (EYV7-0): Not on file  Alcohol Screen: Low Risk (09/17/2024)   Alcohol Screen    Last Alcohol Screening Score (AUDIT): 3  Housing: High Risk (09/17/2024)   Epic    Unable to Pay for Housing in the Last Year: No    Number of Times Moved  in the Last Year: 1    Homeless in the Last Year: Yes  Utilities: Not At Risk (09/17/2024)   Epic    Threatened with loss of utilities: No  Health Literacy: Not on file    Hospital Course:     During the patient's hospitalization, patient had extensive initial psychiatric evaluation, and follow-up psychiatric evaluations every day.   Psychiatric diagnoses provided upon initial assessment:    Principal Diagnosis: Bipolar disorder (HCC) Diagnosis:  Principal Problem:   Bipolar disorder (HCC)   Depression   Anxiety   Substance use disorder   Patient's psychiatric medications were adjusted on admission:  --Initiated Abilify  tablets 5 mg p.o. daily q. nightly for psychosis -- Initiated Depakote  tablet 250 mg every 12 hours for mood stabilization -- Continued gabapentin  capsule 300 mg p.o. daily for anxiety  -- Discontinued Seroquel  tablet p.o. daily for anxiety and sleep due to not effectiveness -- Initiated nicotine  patch 21 mg daily for nicotine  dependence -- Initiated trazodone  50 mg at bedtime as needed for sleep   During the hospitalization, other adjustments were made to the patient's psychiatric medication regimen:  Discontinued Abilify  5 mg at bedtime for psychosis  Restarted Seroquel  150 mg 3 times daily for anxiety and sleep per patient request for good therapeutic effect Increased gabapentin  300 mg 3 times daily for anxiety/pain    Patient's care was discussed during the interdisciplinary team meeting every day during the hospitalization.   The patient denied  having side effects to prescribed psychiatric medication.   Gradually, patient started adjusting to milieu. The patient was evaluated each day by a clinical provider to ascertain response to treatment. Improvement was noted by the patient's report of decreasing symptoms, improved sleep and appetite, affect, medication tolerance, behavior, and participation in unit programming.  Patient was asked each day to complete  a self inventory noting mood, mental status, pain, new symptoms, anxiety and concerns.     Symptoms were reported as significantly decreased or resolved completely by discharge.    On day of discharge, the patient reports that their mood is stable. The patient denied having suicidal thoughts for more than 48 hours prior to discharge.  Patient denies having homicidal thoughts.  Patient denies having auditory hallucinations.  Patient denies any visual hallucinations or other symptoms of psychosis. The patient was motivated to continue taking medication with a goal of continued improvement in mental health.    The patient reports their target psychiatric symptoms of depression, suicidal ideations and  command auditory hallucinations  responded well to the psychiatric medications, and the patient reports overall benefit other psychiatric hospitalization. Supportive psychotherapy was provided to the patient. The patient also participated in regular group therapy while hospitalized. Coping skills, problem solving as well as relaxation therapies were also part of the unit programming.   Labs were reviewed with the patient, and abnormal results were discussed with the patient.   The patient is able to verbalize their individual safety plan to this provider.   # It is recommended to the patient to continue psychiatric medications as prescribed, after discharge from the hospital.     # It is recommended to the patient to follow up with your outpatient psychiatric provider and PCP.   # It was discussed with the patient, the impact of alcohol, drugs, tobacco have been there overall psychiatric and medical wellbeing, and total abstinence from substance use was recommended the patient.ed.   # Prescriptions provided or sent directly to preferred pharmacy at discharge. Patient agreeable to plan. Given opportunity to ask questions. Appears to feel comfortable with discharge.    # In the event of worsening  symptoms, the patient is instructed to call the crisis hotline, 911 and or go to the nearest ED for appropriate evaluation and treatment of symptoms. To follow-up with primary care provider for other medical issues, concerns and or health care needs   # Patient was discharged home with a plan to follow up as noted below.  Physical Findings: AIMS:  , ,  ,  ,  ,  ,  Zero. No EPS noted.  CIWA:    COWS:    Musculoskeletal: Strength & Muscle Tone: within normal limits Gait & Station: normal Patient leans: N/A   Psychiatric Specialty Exam   Presentation  General Appearance:  Appropriate for Environment; Casual   Eye Contact: Good   Speech: Clear and Coherent   Speech Volume: Normal   Handedness: Right     Mood and Affect  Mood: Euthymic   Duration of Depression Symptoms: No data recorded Affect: Appropriate; Congruent     Thought Process  Thought Processes: Coherent   Descriptions of Associations:Intact   Orientation:Full (Time, Place and Person)   Thought Content:Logical   History of Schizophrenia/Schizoaffective disorder:No data recorded Duration of Psychotic Symptoms:No data recorded Hallucinations:Hallucinations: None   Ideas of Reference:None   Suicidal Thoughts:Suicidal Thoughts: No   Homicidal Thoughts:Homicidal Thoughts: No     Sensorium  Memory: Immediate Good; Recent Good   Judgment: Fair   Insight: Fair     Art Therapist  Concentration: Good   Attention Span: Good   Recall: Good   Fund of Knowledge: Good   Language: Good     Psychomotor Activity  Psychomotor Activity:Psychomotor Activity: Normal     Assets  Assets: Communication Skills; Desire for Improvement; Social Support; Resilience     Sleep  Sleep:Sleep: Good     Physical Exam: Physical Exam Constitutional:      Appearance: Normal appearance. She is not ill-appearing.  Pulmonary:     Effort: Pulmonary effort is normal.  Musculoskeletal:         General: Normal range of motion.     Review of Systems  Gastrointestinal:  Negative for nausea and vomiting.  Psychiatric/Behavioral:  Negative for depression, hallucinations and suicidal ideas. The patient is not nervous/anxious and does not have insomnia.   Blood pressure (!) 129/94, pulse (!) 102, temperature 97.9 F (36.6 C), temperature source Oral, resp. rate 20, height 4' 11 (1.499  m), weight 71.8 kg, SpO2 99%. Body mass index is 31.95 kg/m.   Tobacco Use History[1] Tobacco Cessation:  A prescription for an FDA-approved tobacco cessation medication provided at discharge   Blood Alcohol level:  Lab Results  Component Value Date   ETH 197 (H) 09/25/2019   ETH 171 (H) 01/06/2016    Metabolic Disorder Labs:  Lab Results  Component Value Date   HGBA1C 5.5 09/17/2024   MPG 111.15 09/17/2024   MPG 103 09/29/2019   No results found for: PROLACTIN Lab Results  Component Value Date   CHOL 273 (H) 09/17/2024   TRIG 500 (H) 09/17/2024   HDL 46 09/17/2024   CHOLHDL 5.9 09/17/2024   VLDL 899 (H) 09/17/2024   LDLCALC UNABLE TO CALCULATE IF TRIGLYCERIDE OVER 400 mg/dL 98/69/7973    See Psychiatric Specialty Exam and Suicide Risk Assessment completed by Attending Physician prior to discharge.  Discharge destination:  Home  Is patient on multiple antipsychotic therapies at discharge:  No   Has Patient had three or more failed trials of antipsychotic monotherapy by history:  No  Recommended Plan for Multiple Antipsychotic Therapies: NA  Discharge Instructions     Increase activity slowly   Complete by: As directed       Allergies as of 09/23/2024       Reactions   Latex Hives   Penicillins Hives   Has patient had a PCN reaction causing immediate rash, facial/tongue/throat swelling, SOB or lightheadedness with hypotension: Yes Has patient had a PCN reaction causing severe rash involving mucus membranes or skin necrosis: UNKNOWN (ACTIVE ALLERGY, per sister) Has  patient had a PCN reaction that required hospitalization: UNKNOWN (ACTIVE ALLERGY, per sister) Has patient had a PCN reaction occurring within the last 10 years: UNKNOWN (ACTIVE ALLERGY, per sister) If all of the above answers are NO,         Medication List     TAKE these medications      Indication  albuterol  108 (90 Base) MCG/ACT inhaler Commonly known as: VENTOLIN  HFA Inhale 2 puffs into the lungs every 6 (six) hours as needed for wheezing or shortness of breath.  Indication: Spasm of Lung Air Passages   diclofenac  Sodium 1 % Gel Commonly known as: VOLTAREN  Apply 2 g topically 3 (three) times daily as needed (Joint pain).  Indication: Joint Damage causing Pain and Loss of Function   divalproex  250 MG DR tablet Commonly known as: DEPAKOTE  Take 1 tablet (250 mg total) by mouth every 12 (twelve) hours.  Indication: MIXED BIPOLAR AFFECTIVE DISORDER   gabapentin  300 MG capsule Commonly known as: NEURONTIN  Take 300 mg by mouth 3 (three) times daily.  Indication: Neuropathic Pain, Anxiety   lisinopril  20 MG tablet Commonly known as: ZESTRIL  Take 20 mg by mouth daily.  Indication: High Blood Pressure   nicotine  21 mg/24hr patch Commonly known as: NICODERM CQ  - dosed in mg/24 hours Place 1 patch (21 mg total) onto the skin daily at 6 (six) AM. Start taking on: September 24, 2024  Indication: Nicotine  Addiction   pantoprazole  40 MG tablet Commonly known as: PROTONIX  Take 40 mg by mouth daily.  Indication: Gastroesophageal Reflux Disease   QUEtiapine  50 MG tablet Commonly known as: SEROQUEL  Take 1 tablet (50 mg total) by mouth 3 (three) times daily. What changed: when to take this  Indication: Manic-Depression   traZODone  50 MG tablet Commonly known as: DESYREL  Take 1 tablet (50 mg total) by mouth at bedtime as needed for sleep.  Indication:  Trouble Sleeping   umeclidinium-vilanterol 62.5-25 MCG/ACT Aepb Commonly known as: ANORO ELLIPTA  Inhale 1 puff into the  lungs daily. Start taking on: September 24, 2024  Indication: Chronic Obstructive Lung Disease        Follow-up Information     [facility]. Go on 09/24/2024.   Why: Please go to this provider on 09/24/24 at 8:30 am for a hospital follow up assessment, to obtain therapy and medication management services.  They are open 24/7 for assessments. Contact information: [address] [place] [address] [phone]                 Follow-up recommendations:    Activity: as tolerated   Diet: Regular   Other: -Follow-up with your outpatient psychiatric provider -instructions on appointment date, time, and address (location) are provided to you in discharge paperwork.   -Take your psychiatric medications as prescribed at discharge - instructions are provided to you in the discharge paperwork   -Follow-up with outpatient primary care doctor and other specialists -for management of preventative medicine and chronic medical disease   -Testing: Follow-up with outpatient provider for abnormal lab results: 09/17/24 Lipid panel: Cholesterol 273, Triglycerides 500, VLDL 100 09/22/2024 Depakote  27   -If you are prescribed an atypical antipsychotic medication, we recommend that your outpatient psychiatrist follow routine screening for side effects within 3 months of discharge, including monitoring: AIMS scale, height, weight, blood pressure, fasting lipid panel, HbA1c, and fasting blood sugar.    -Recommend total abstinence from alcohol, tobacco, and other illicit drug use at discharge.    -If your psychiatric symptoms recur, worsen, or if you have side effects to your psychiatric medications, call your outpatient psychiatric provider, 911, [phone] or go to the nearest emergency department.   -If suicidal thoughts occur, immediately call your outpatient psychiatric provider, 911, [phone] or go to the nearest emergency department.  Signed: PATTI OLDEN, MD 09/23/2024, 1:27  PM           [1]  Social History Tobacco Use  Smoking Status Every Day   Current packs/day: 1.00   Average packs/day: 1 pack/day for 24.0 years (24.0 ttl pk-yrs)   Types: Cigarettes   Start date: 09/20/2000  Smokeless Tobacco Never   "

## 2024-09-23 NOTE — [facility] Group Notes (Signed)
 Patient did not attend Nutrition Group.

## 2024-09-23 NOTE — Progress Notes (Signed)
 Pt discharged to taxi. Pt was stable and appreciative at that time. All papers and prescriptions were given and valuables returned. Verbal understanding expressed. Denies SI/HI and A/VH. Pt given opportunity to express concerns and ask questions.

## 2024-09-23 NOTE — Plan of Care (Signed)
   Problem: Education: Goal: Knowledge of [facility] Education information/materials will improve Outcome: Progressing Goal: Emotional status will improve Outcome: Progressing Goal: Mental status will improve Outcome: Progressing Goal: Verbalization of understanding the information provided will improve Outcome: Progressing

## 2024-09-23 NOTE — Discharge Instructions (Signed)

## 2024-09-23 NOTE — Group Note (Signed)
 Date:  09/23/2024 Time:  9:53 AM  Group Topic/Focus: Goals Group Goals Group:   The focus of this group is to help patients establish daily goals to achieve during treatment and discuss how the patient can incorporate goal setting into their daily lives to aide in recovery.    Participation Level:  Did Not Attend  Participation Quality:  NA  Affect:  NA  Cognitive:  NA  Insight: NA  Engagement in Group:  NA  Modes of Intervention:  NA  Additional Comments:  Patient did not attend group.  Rosaleen BIRCH [facility] 09/23/2024, 9:53 AM

## 2024-09-23 NOTE — Progress Notes (Signed)
 Patient denies SI, HI, AVH. Patient stated they slept Good last night. Scored 0/10 on anxiety, feeling of hopelessness, and depression. Patient goal is to Stay focused and states will Stay positive talk about positive people to help reach their goal. Patient has been calm, cooperative, and med compliant.     09/23/24 0800  Psych Admission Type (Psych Patients Only)  Admission Status Involuntary  Psychosocial Assessment  Patient Complaints None  Eye Contact Fair  Facial Expression Flat  Affect Appropriate to circumstance  Speech Logical/coherent  Interaction Assertive  Motor Activity Other (Comment) (WDL)  Appearance/Hygiene Unremarkable  Behavior Characteristics Cooperative  Mood Pleasant  Thought Process  Coherency WDL  Content WDL  Delusions None reported or observed  Perception WDL  Hallucination None reported or observed  Judgment Impaired  Confusion None  Danger to Self  Current suicidal ideation? Denies  Danger to Others  Danger to Others None reported or observed

## 2024-09-23 NOTE — [facility] Suicide Risk Assessment (Signed)
 [facility] Discharge Suicide Risk Assessment   Principal Problem: Bipolar 1 disorder, mixed (HCC) Discharge Diagnoses: Principal Problem:   Bipolar 1 disorder, mixed (HCC)  During the patient's hospitalization, patient had extensive initial psychiatric evaluation, and follow-up psychiatric evaluations every day.  Psychiatric diagnoses provided upon initial assessment:   Principal Diagnosis: Bipolar disorder (HCC) Diagnosis:  Principal Problem:   Bipolar disorder (HCC)   Depression   Anxiety   Substance use disorder  Patient's psychiatric medications were adjusted on admission:  --Initiated Abilify  tablets 5 mg p.o. daily q. nightly for psychosis -- Initiated Depakote  tablet 250 mg every 12 hours for mood stabilization -- Continued gabapentin  capsule 300 mg p.o. daily for anxiety  -- Discontinued Seroquel  tablet p.o. daily for anxiety and sleep due to not effectiveness -- Initiated nicotine  patch 21 mg daily for nicotine  dependence -- Initiated trazodone  50 mg at bedtime as needed for sleep  During the hospitalization, other adjustments were made to the patient's psychiatric medication regimen:  Discontinued Abilify  5 mg at bedtime for psychosis  Restarted Seroquel  150 mg 3 times daily for anxiety and sleep per patient request for good therapeutic effect Increased gabapentin  300 mg 3 times daily for anxiety/pain   Patient's care was discussed during the interdisciplinary team meeting every day during the hospitalization.  The patient denied  having side effects to prescribed psychiatric medication.  Gradually, patient started adjusting to milieu. The patient was evaluated each day by a clinical provider to ascertain response to treatment. Improvement was noted by the patient's report of decreasing symptoms, improved sleep and appetite, affect, medication tolerance, behavior, and participation in unit programming.  Patient was asked each day to complete a self inventory noting mood, mental  status, pain, new symptoms, anxiety and concerns.    Symptoms were reported as significantly decreased or resolved completely by discharge.   On day of discharge, the patient reports that their mood is stable. The patient denied having suicidal thoughts for more than 48 hours prior to discharge.  Patient denies having homicidal thoughts.  Patient denies having auditory hallucinations.  Patient denies any visual hallucinations or other symptoms of psychosis. The patient was motivated to continue taking medication with a goal of continued improvement in mental health.   The patient reports their target psychiatric symptoms of depression, suicidal ideations and command auditory hallucinations  responded well to the psychiatric medications, and the patient reports overall benefit other psychiatric hospitalization. Supportive psychotherapy was provided to the patient. The patient also participated in regular group therapy while hospitalized. Coping skills, problem solving as well as relaxation therapies were also part of the unit programming.  Labs were reviewed with the patient, and abnormal results were discussed with the patient.  The patient is able to verbalize their individual safety plan to this provider.  # It is recommended to the patient to continue psychiatric medications as prescribed, after discharge from the hospital.    # It is recommended to the patient to follow up with your outpatient psychiatric provider and PCP.  # It was discussed with the patient, the impact of alcohol, drugs, tobacco have been there overall psychiatric and medical wellbeing, and total abstinence from substance use was recommended the patient.ed.  # Prescriptions provided or sent directly to preferred pharmacy at discharge. Patient agreeable to plan. Given opportunity to ask questions. Appears to feel comfortable with discharge.    # In the event of worsening symptoms, the patient is instructed to call the crisis  hotline, 911 and or go to  the nearest ED for appropriate evaluation and treatment of symptoms. To follow-up with primary care provider for other medical issues, concerns and or health care needs  # Patient was discharged home with a plan to follow up as noted below.  Musculoskeletal: Strength & Muscle Tone: within normal limits Gait & Station: normal Patient leans: N/A  Psychiatric Specialty Exam  Presentation  General Appearance:  Appropriate for Environment; Casual  Eye Contact: Good  Speech: Clear and Coherent  Speech Volume: Normal  Handedness: Right   Mood and Affect  Mood: Euthymic  Duration of Depression Symptoms: No data recorded Affect: Appropriate; Congruent   Thought Process  Thought Processes: Coherent  Descriptions of Associations:Intact  Orientation:Full (Time, Place and Person)  Thought Content:Logical  History of Schizophrenia/Schizoaffective disorder:No data recorded Duration of Psychotic Symptoms:No data recorded Hallucinations:Hallucinations: None  Ideas of Reference:None  Suicidal Thoughts:Suicidal Thoughts: No  Homicidal Thoughts:Homicidal Thoughts: No   Sensorium  Memory: Immediate Good; Recent Good  Judgment: Fair  Insight: Fair   Art Therapist  Concentration: Good  Attention Span: Good  Recall: Good  Fund of Knowledge: Good  Language: Good   Psychomotor Activity  Psychomotor Activity:Psychomotor Activity: Normal   Assets  Assets: Communication Skills; Desire for Improvement; Social Support; Resilience   Sleep  Sleep:Sleep: Good  Estimated Sleeping Duration (Last 24 Hours): 8.75-9.00 hours  Physical Exam: Physical Exam Constitutional:      Appearance: Normal appearance. She is not ill-appearing.  Pulmonary:     Effort: Pulmonary effort is normal.  Musculoskeletal:        General: Normal range of motion.    Review of Systems  Gastrointestinal:  Negative for nausea and vomiting.   Psychiatric/Behavioral:  Negative for depression, hallucinations and suicidal ideas. The patient is not nervous/anxious and does not have insomnia.    Blood pressure (!) 129/94, pulse (!) 102, temperature 97.9 F (36.6 C), temperature source Oral, resp. rate 20, height 4' 11 (1.499 m), weight 71.8 kg, SpO2 99%. Body mass index is 31.95 kg/m.  Mental Status Per Nursing Assessment::   On Admission:  NA  Demographic Factors:  Divorced or widowed, Caucasian, and Low socioeconomic status  Loss Factors: NA  Historical Factors: Prior suicide attempts  Risk Reduction Factors:   Sense of responsibility to family, Employed, and Living with another person, especially a relative  Continued Clinical Symptoms:  On day of discharge, patient denied any depression or anxiety, suicidal ideations auditory visual hallucinations.  Patient did not appear to be manic and was appropriate on the unit and deemed stable for discharge.  Cognitive Features That Contribute To Risk:  None    Suicide Risk:  Low acute risk, given patient has been appropriate on the floor, compliant with medications, she is future and goal oriented and concentrating on dealing with her substance issues. Patient is motivated to get help for her family and for herself.  She not have access to guns and will also be discharging to a family member's residence who will be able to monitor them.   Follow-up Information     [facility] Go on 09/24/2024.   Why: Please go to this provider on 09/24/24 at 8:30 am for a hospital follow up assessment, to obtain therapy and medication management services.  They are open 24/7 for assessments. Contact information: [address] [place] [address] [phone]                 Plan Of Care/Follow-up recommendations:  Activity: as tolerated  Diet: Regular  Other: -Follow-up with your outpatient psychiatric provider -instructions on appointment date, time, and  address (location) are provided to you in discharge paperwork.  -Take your psychiatric medications as prescribed at discharge - instructions are provided to you in the discharge paperwork  -Follow-up with outpatient primary care doctor and other specialists -for management of preventative medicine and chronic medical disease  -Testing: Follow-up with outpatient provider for abnormal lab results: \ 09/17/24 Lipid panel: Cholesterol 273, Triglycerides 500, VLDL 100 09/22/2024 Depakote  27  -If you are prescribed an atypical antipsychotic medication, we recommend that your outpatient psychiatrist follow routine screening for side effects within 3 months of discharge, including monitoring: AIMS scale, height, weight, blood pressure, fasting lipid panel, HbA1c, and fasting blood sugar.   -Recommend total abstinence from alcohol, tobacco, and other illicit drug use at discharge.   -If your psychiatric symptoms recur, worsen, or if you have side effects to your psychiatric medications, call your outpatient psychiatric provider, 911, [phone] or go to the nearest emergency department.  -If suicidal thoughts occur, immediately call your outpatient psychiatric provider, 911, [phone] or go to the nearest emergency department.  PATTI OLDEN, MD 09/23/2024, 9:12 AM

## 2024-09-23 NOTE — Progress Notes (Signed)
" °  [facility] Adult Case Management Discharge Plan :  Will you be returning to the same living situation after discharge:  Yes,  pt returning home with mother at discharge At discharge, do you have transportation home?: Yes,  CSW arranged [employer] for 1030AM Do you have the ability to pay for your medications: Yes,  pt has active health insurance coverage  Release of information consent forms completed and in the chart;  Patient's signature needed at discharge.  Patient to Follow up at:  Follow-up Information     [facility] Go on 09/24/2024.   Why: Please go to this provider on 09/24/24 at 8:30 am for a hospital follow up assessment, to obtain therapy and medication management services.  They are open 24/7 for assessments. Contact information: [address] [place] [address] [phone]                 Next level of care provider has access to [facility] Link:no  Safety Planning and Suicide Prevention discussed: Yes,  Ronal Slade (mother) [phone]     Has patient been referred to the Quitline?: Yes, faxed/e-referral on 09/23/24  Patient has been referred for addiction treatment: No known substance use disorder. Pt has list of SA treatment list, reports she will go to [facility] on Sunday in [place]   Jenkins LULLA Primer, LCSWA 09/23/2024, 9:32 AM "
